# Patient Record
Sex: Male | Born: 1950 | Hispanic: No | Marital: Married | Smoking: Former smoker
Health system: Southern US, Community
[De-identification: ages and names within clinical notes are randomized; demographics above are authoritative.]

## PROBLEM LIST (undated history)

## (undated) DIAGNOSIS — I639 Cerebral infarction, unspecified: Secondary | ICD-10-CM

## (undated) DIAGNOSIS — E785 Hyperlipidemia, unspecified: Secondary | ICD-10-CM

## (undated) DIAGNOSIS — E119 Type 2 diabetes mellitus without complications: Secondary | ICD-10-CM

## (undated) DIAGNOSIS — I6529 Occlusion and stenosis of unspecified carotid artery: Secondary | ICD-10-CM

## (undated) DIAGNOSIS — I10 Essential (primary) hypertension: Secondary | ICD-10-CM

## (undated) HISTORY — DX: Occlusion and stenosis of unspecified carotid artery: I65.29

## (undated) HISTORY — DX: Type 2 diabetes mellitus without complications: E11.9

---

## 2001-12-28 HISTORY — PX: NECK SURGERY: SHX720

## 2002-01-03 ENCOUNTER — Emergency Department (HOSPITAL_COMMUNITY): Admission: EM | Admit: 2002-01-03 | Discharge: 2002-01-03 | Payer: Self-pay | Admitting: Emergency Medicine

## 2002-06-19 ENCOUNTER — Encounter: Payer: Self-pay | Admitting: *Deleted

## 2002-06-19 ENCOUNTER — Ambulatory Visit (HOSPITAL_COMMUNITY): Admission: RE | Admit: 2002-06-19 | Discharge: 2002-06-19 | Payer: Self-pay | Admitting: *Deleted

## 2002-06-20 ENCOUNTER — Encounter: Payer: Self-pay | Admitting: *Deleted

## 2002-06-20 ENCOUNTER — Ambulatory Visit (HOSPITAL_COMMUNITY): Admission: RE | Admit: 2002-06-20 | Discharge: 2002-06-20 | Payer: Self-pay | Admitting: *Deleted

## 2002-07-07 ENCOUNTER — Inpatient Hospital Stay (HOSPITAL_COMMUNITY): Admission: RE | Admit: 2002-07-07 | Discharge: 2002-07-09 | Payer: Self-pay | Admitting: Neurosurgery

## 2002-07-07 ENCOUNTER — Encounter: Payer: Self-pay | Admitting: Neurosurgery

## 2013-06-27 DIAGNOSIS — I639 Cerebral infarction, unspecified: Secondary | ICD-10-CM

## 2013-06-27 HISTORY — DX: Cerebral infarction, unspecified: I63.9

## 2013-07-11 ENCOUNTER — Ambulatory Visit (INDEPENDENT_AMBULATORY_CARE_PROVIDER_SITE_OTHER): Payer: Self-pay | Admitting: Family Medicine

## 2013-07-11 VITALS — BP 144/80 | HR 78 | Temp 97.8°F | Resp 18 | Ht 64.0 in | Wt 153.0 lb

## 2013-07-11 DIAGNOSIS — R200 Anesthesia of skin: Secondary | ICD-10-CM

## 2013-07-11 DIAGNOSIS — Z13220 Encounter for screening for lipoid disorders: Secondary | ICD-10-CM

## 2013-07-11 DIAGNOSIS — R209 Unspecified disturbances of skin sensation: Secondary | ICD-10-CM

## 2013-07-11 LAB — LIPID PANEL
Cholesterol: 257 mg/dL — ABNORMAL HIGH (ref 0–200)
HDL: 38 mg/dL — ABNORMAL LOW (ref >39)
Total CHOL/HDL Ratio: 6.8 Ratio
Triglycerides: 452 mg/dL — ABNORMAL HIGH (ref <150)

## 2013-07-11 LAB — POCT GLYCOSYLATED HEMOGLOBIN (HGB A1C): Hemoglobin A1C: 6.4

## 2013-07-11 NOTE — Patient Instructions (Signed)
1800 Calorie Diet for Diabetes Meal Planning The 1800 calorie diet is designed for eating up to 1800 calories each day. Following this diet and making healthy meal choices can help improve overall health. This diet controls blood sugar (glucose) levels and can also help lower blood pressure and cholesterol. SERVING SIZES Measuring foods and serving sizes helps to make sure you are getting the right amount of food. The list below tells how big or small some common serving sizes are:  1 oz.........4 stacked dice.  3 oz........Marland KitchenDeck of cards.  1 tsp.......Marland KitchenTip of little finger.  1 tbs......Marland KitchenMarland KitchenThumb.  2 tbs.......Marland KitchenGolf ball.   cup......Marland KitchenHalf of a fist.  1 cup.......Marland KitchenA fist. GUIDELINES FOR CHOOSING FOODS The goal of this diet is to eat a variety of foods and limit calories to 1800 each day. This can be done by choosing foods that are low in calories and fat. The diet also suggests eating small amounts of food frequently. Doing this helps control your blood glucose levels so they do not get too high or too low. Each meal or snack may include a protein food source to help you feel more satisfied and to stabilize your blood glucose. Try to eat about the same amount of food around the same time each day. This includes weekend days, travel days, and days off work. Space your meals about 4 to 5 hours apart and add a snack between them if you wish.  For example, a daily food plan could include breakfast, a morning snack, lunch, dinner, and an evening snack. Healthy meals and snacks include whole grains, vegetables, fruits, lean meats, poultry, fish, and dairy products. As you plan your meals, select a variety of foods. Choose from the bread and starch, vegetable, fruit, dairy, and meat/protein groups. Examples of foods from each group and their suggested serving sizes are listed below. Use measuring cups and spoons to become familiar with what a healthy portion looks like. Bread and Starch Each serving  equals 15 grams of carbohydrates.  1 slice bread.   bagel.   cup cold cereal (unsweetened).   cup hot cereal or mashed potatoes.  1 small potato (size of a computer mouse).   cup cooked pasta or rice.   English muffin.  1 cup broth-based soup.  3 cups of popcorn.  4 to 6 whole-wheat crackers.   cup cooked beans, peas, or corn. Vegetable Each serving equals 5 grams of carbohydrates.   cup cooked vegetables.  1 cup raw vegetables.   cup tomato or vegetable juice. Fruit Each serving equals 15 grams of carbohydrates.  1 small apple or orange.  1 cup watermelon or strawberries.   cup applesauce (no sugar added).  2 tbs raisins.   banana.   cup canned fruit, packed in water, its own juice, or sweetened with a sugar substitute.   cup unsweetened fruit juice. Dairy Each serving equals 12 to 15 grams of carbohydrates.  1 cup fat-free milk.  6 oz artificially sweetened yogurt or plain yogurt.  1 cup low-fat buttermilk.  1 cup soy milk.  1 cup almond milk. Meat/Protein  1 large egg.  2 to 3 oz meat, poultry, or fish.   cup low-fat cottage cheese.  1 tbs peanut butter.  1 oz low-fat cheese.   cup tuna in water.   cup tofu. Fat  1 tsp oil.  1 tsp trans-fat-free margarine.  1 tsp butter.  1 tsp mayonnaise.  2 tbs avocado.  1 tbs salad dressing.  1 tbs cream cheese.  2 tbs sour cream. SAMPLE 1800 CALORIE DIET PLAN Breakfast   cup unsweetened cereal (1 carb serving).  1 cup fat-free milk (1 carb serving).  1 slice whole-wheat toast (1 carb serving).   small banana (1 carb serving).  1 scrambled egg.  1 tsp trans-fat-free margarine. Lunch  Tuna sandwich.  2 slices whole-wheat bread (2 carb servings).   cup canned tuna in water, drained.  1 tbs reduced fat mayonnaise.  1 stalk celery, chopped.  2 slices tomato.  1 lettuce leaf.  1 cup carrot sticks.  24 to 30 seedless grapes (2 carb  servings).  6 oz light yogurt (1 carb serving). Afternoon Snack  3 graham cracker squares (1 carb serving).  Fat-free milk, 1 cup (1 carb serving).  1 tbs peanut butter. Dinner  3 oz salmon, broiled with 1 tsp oil.  1 cup mashed potatoes (2 carb servings) with 1 tsp trans-fat-free margarine.  1 cup fresh or frozen green beans.  1 cup steamed asparagus.  1 cup fat-free milk (1 carb serving). Evening Snack  3 cups air-popped popcorn (1 carb serving).  2 tbs parmesan cheese sprinkled on top. MEAL PLAN Use this worksheet to help you make a daily meal plan based on the 1800 calorie diet suggestions. If you are using this plan to help you control your blood glucose, you may interchange carbohydrate-containing foods (dairy, starches, and fruits). Select a variety of fresh foods of varying colors and flavors. The total amount of carbohydrate in your meals or snacks is more important than making sure you include all of the food groups every time you eat. Choose from the following foods to build your day's meals:  8 Starches.  4 Vegetables.  3 Fruits.  2 Dairy.  6 to 7 oz Meat/Protein.  Up to 4 Fats. Your dietician can use this worksheet to help you decide how many servings and which types of foods are right for you. BREAKFAST Food Group and Servings / Food Choice Starch ________________________________________________________ Dairy _________________________________________________________ Fruit _________________________________________________________ Meat/Protein __________________________________________________ Fat ___________________________________________________________ LUNCH Food Group and Servings / Food Choice Starch ________________________________________________________ Meat/Protein __________________________________________________ Vegetable _____________________________________________________ Fruit  _________________________________________________________ Dairy _________________________________________________________ Fat ___________________________________________________________ Aura Fey Food Group and Servings / Food Choice Starch ________________________________________________________ Meat/Protein __________________________________________________ Fruit __________________________________________________________ Dairy _________________________________________________________ Laural Golden Food Group and Servings / Food Choice Starch _________________________________________________________ Meat/Protein ___________________________________________________ Dairy __________________________________________________________ Vegetable ______________________________________________________ Fruit ___________________________________________________________ Fat ____________________________________________________________ Lollie Sails Food Group and Servings / Food Choice Fruit __________________________________________________________ Meat/Protein ___________________________________________________ Dairy __________________________________________________________ Starch _________________________________________________________ DAILY TOTALS Starch ____________________________ Vegetable _________________________ Fruit _____________________________ Dairy _____________________________ Meat/Protein______________________ Fat _______________________________ Document Released: 07/06/2005 Document Revised: 03/07/2012 Document Reviewed: 10/30/2011 [employer] Patient Information 2014 [employer]. Diabetes, Eating Away From Home Sometimes, you might eat in a restaurant or have meals that are prepared by someone else. You can enjoy eating out. However, the portions in restaurants may be much larger than needed. Listed below are some ideas to help you choose foods that will keep your blood glucose  (sugar) in better control.  TIPS FOR EATING OUT  Know your meal plan and how many carbohydrate servings you should have at each meal. You may wish to carry a copy of your meal plan in your purse or wallet. Learn the foods included in each food group.  Make a list of restaurants near you that offer healthy choices. Take a copy of the carry-out menus to see what they offer. Then, you can plan what you will order ahead of time.  Become familiar with serving sizes by practicing them  at home using measuring cups and spoons. Once you learn to recognize portion sizes, you will be able to correctly estimate the amount of total carbohydrate you are allowed to eat at the restaurant. Ask for a takeout box if the portion is more than you should have. When your food comes, leave the amount you should have on the plate, and put the rest in the takeout box before you start eating.  Plan ahead if your mealtime will be different from usual. Check with your caregiver to find out how to time meals and medicine if you are taking insulin.  Avoid high-fat foods, such as fried foods, cream sauces, high-fat salad dressings, or any added butter or margarine.  Do not be afraid to ask questions. Ask your server about the portion size, cooking methods, ingredients and if items can be substituted. Restaurants do not list all available items on the menu. You can ask for your main entree to be prepared using skim milk, oil instead of butter or margarine, and without gravy or sauces. Ask your waiter or waitress to serve salad dressings, gravy, sauces, margarine, and sour cream on the side. You can then add the amount your meal plan suggests.  Add more vegetables whenever possible.  Avoid items that are labeled "jumbo," "giant," "deluxe," or "supersized."  You may want to split an entre with someone and order an extra side salad.  Watch for hidden calories in foods like croutons, bacon, or cheese.  Ask your server to take  away the bread basket or chips from your table.  Order a dinner salad as an appetizer. You can eat most foods served in a restaurant. Some foods are better choices than others. Breads and Starches  Recommended: All kinds of bread (wheat, rye, white, oatmeal, [place], [place], raisin), hard or soft dinner rolls, frankfurter or hamburger buns, small bagels, small corn or whole-wheat flour tortillas.  Avoid: Frosted or glazed breads, butter rolls, egg or cheese breads, croissants, sweet rolls, pastries, coffee cake, glazed or frosted doughnuts, muffins. Crackers  Recommended: Animal crackers, graham, rye, saltine, oyster, and matzoth crackers. Bread sticks, melba toast, rusks, pretzels, popcorn (without fat), zwieback toast.  Avoid: High-fat snack crackers or chips. Buttered popcorn. Cereals  Recommended: Hot and cold cereals. Whole grains such as oatmeal or shredded wheat are good choices.  Avoid: Sugar-coated or granola type cereals. Potatoes/Pasta/Rice/Beans  Recommended: Order baked, boiled, or mashed potatoes, rice or noodles without added fat, whole beans. Order gravies, butter, margarine, or sauces on the side so you can control the amount you add.  Avoid: Hash browns or fried potatoes. Potatoes, pasta, or rice prepared with cream or cheese sauce. Potato or pasta salads prepared with large amounts of dressing. Fried beans or fried rice. Vegetables  Recommended: Order steamed, baked, boiled, or stewed vegetables without sauces or extra fat. Ask that sauce be served on the side. If vegetables are not listed on the menu, ask what is available.  Avoid: Vegetables prepared with cream, butter, or cheese sauce. Fried vegetables. Salad Bars  Recommended: Many of the vegetables at a salad bar are considered "free." Use lemon juice, vinegar, or low-calorie salad dressing (fewer than 20 calories per serving) as "free" dressings for your salad. Look for salad bar ingredients that have no added  fat or sugar such as tomatoes, lettuce, cucumbers, broccoli, carrots, onions, and mushrooms.  Avoid: Prepared salads with large amounts of dressing, such as coleslaw, caesar salad, macaroni salad, bean salad, or carrot salad. Fruit  Recommended:  Eat fresh fruit or fresh fruit salad without added dressing. A salad bar often offers fresh fruit choices, but canned fruit at a restaurant is usually packed in sugar or syrup.  Avoid: Sweetened canned or frozen fruits, plain or sweetened fruit juice. Fruit salads with dressing, sour cream, or sugar added to them. Meat and Meat Substitutes  Recommended: Order broiled, baked, roasted, or grilled meat, poultry, or fish. Trim off all visible fat. Do not eat the skin of poultry. The size stated on the menu is the raw weight. Meat shrinks by  in cooking (for example, 4 oz raw equals 3 oz cooked meat).  Avoid: Deep-fat fried meat, poultry, or fish. Breaded meats. Eggs  Recommended: Order soft, hard-cooked, poached, or scrambled eggs. Omelets may be okay, depending on what ingredients are added. Egg substitutes are also a good choice.  Avoid: Fried eggs, eggs prepared with cream or cheese sauce. Milk  Recommended: Order low-fat or fat-free milk according to your meal plan. Plain, nonfat yogurt or flavored yogurt with no sugar added may be used as a substitute for milk. Soy milk may also be used.  Avoid: Milk shakes or sweetened milk beverages. Soups and Combination Foods  Recommended: Clear broth or consomm are "free" foods and may be used as an appetizer. Broth-based soups with fat removed count as a starch serving and are preferred over cream soups. Soups made with beans or split peas may be eaten but count as a starch.  Avoid: Fatty soups, soup made with cream, cheese soup. Combination foods prepared with excessive amounts of fat or with cream or cheese sauces. Desserts and Sweets  Recommended: Ask for fresh fruit. Sponge or angel food cake  without icing, ice milk, no sugar added ice cream, sherbet, or frozen yogurt may fit into your meal plan occasionally.  Avoid: Pastries, puddings, pies, cakes with icing, custard, gelatin desserts. Fats and Oils  Recommended: Choose healthy fats such as olive oil, canola oil, or tub margarine, reduced fat or fat-free sour cream, cream cheese, avocado, or nuts.  Avoid: Any fats in excess of your allowed portion. Deep-fried foods or any food with a large amount of fat. Note: Ask for all fats to be served on the side, and limit your portion sizes according to your meal plan. Document Released: 12/14/2005 Document Revised: 03/07/2012 Document Reviewed: 07/04/2009 [facility] Patient Information 2014 [place], [place]. Diabetes Meal Planning Guide The diabetes meal planning guide is a tool to help you plan your meals and snacks. It is important for people with diabetes to manage their blood glucose (sugar) levels. Choosing the right foods and the right amounts throughout your day will help control your blood glucose. Eating right can even help you improve your blood pressure and reach or maintain a healthy weight. CARBOHYDRATE COUNTING MADE EASY When you eat carbohydrates, they turn to sugar. This raises your blood glucose level. Counting carbohydrates can help you control this level so you feel better. When you plan your meals by counting carbohydrates, you can have more flexibility in what you eat and balance your medicine with your food intake. Carbohydrate counting simply means adding up the total amount of carbohydrate grams in your meals and snacks. Try to eat about the same amount at each meal. Foods with carbohydrates are listed below. Each portion below is 1 carbohydrate serving or 15 grams of carbohydrates. Ask your dietician how many grams of carbohydrates you should eat at each meal or snack. Grains and Starches  1 slice bread.  English muffin or hotdog/hamburger bun.   cup cold cereal  (unsweetened).   cup cooked pasta or rice.   cup starchy vegetables (corn, potatoes, peas, beans, winter squash).  1 tortilla (6 inches).   bagel.  1 waffle or pancake (size of a CD).   cup cooked cereal.  4 to 6 small crackers. *Whole grain is recommended. Fruit  1 cup fresh unsweetened berries, melon, papaya, pineapple.  1 small fresh fruit.   banana or mango.   cup fruit juice (4 oz unsweetened).   cup canned fruit in natural juice or water.  2 tbs dried fruit.  12 to 15 grapes or cherries. Milk and Yogurt  1 cup fat-free or 1% milk.  1 cup soy milk.  6 oz light yogurt with sugar-free sweetener.  6 oz low-fat soy yogurt.  6 oz plain yogurt. Vegetables  1 cup raw or  cup cooked is counted as 0 carbohydrates or a "free" food.  If you eat 3 or more servings at 1 meal, count them as 1 carbohydrate serving. Other Carbohydrates   oz chips or pretzels.   cup ice cream or frozen yogurt.   cup sherbet or sorbet.  2 inch square cake, no frosting.  1 tbs honey, sugar, jam, jelly, or syrup.  2 small cookies.  3 squares of graham crackers.  3 cups popcorn.  6 crackers.  1 cup broth-based soup.  Count 1 cup casserole or other mixed foods as 2 carbohydrate servings.  Foods with less than 20 calories in a serving may be counted as 0 carbohydrates or a "free" food. You may want to purchase a book or computer software that lists the carbohydrate gram counts of different foods. In addition, the nutrition facts panel on the labels of the foods you eat are a good source of this information. The label will tell you how big the serving size is and the total number of carbohydrate grams you will be eating per serving. Divide this number by 15 to obtain the number of carbohydrate servings in a portion. Remember, 1 carbohydrate serving equals 15 grams of carbohydrate. SERVING SIZES Measuring foods and serving sizes helps you make sure you are getting the  right amount of food. The list below tells how big or small some common serving sizes are.  1 oz.........4 stacked dice.  3 oz........Marland KitchenDeck of cards.  1 tsp.......Marland KitchenTip of little finger.  1 tbs......Marland KitchenMarland KitchenThumb.  2 tbs.......Marland KitchenGolf ball.   cup......Marland KitchenHalf of a fist.  1 cup.......Marland KitchenA fist. SAMPLE DIABETES MEAL PLAN Below is a sample meal plan that includes foods from the grain and starches, dairy, vegetable, fruit, and meat groups. A dietician can individualize a meal plan to fit your calorie needs and tell you the number of servings needed from each food group. However, controlling the total amount of carbohydrates in your meal or snack is more important than making sure you include all of the food groups at every meal. You may interchange carbohydrate containing foods (dairy, starches, and fruits). The meal plan below is an example of a 2000 calorie diet using carbohydrate counting. This meal plan has 17 carbohydrate servings. Breakfast  1 cup oatmeal (2 carb servings).   cup light yogurt (1 carb serving).  1 cup blueberries (1 carb serving).   cup almonds. Snack  1 large apple (2 carb servings).  1 low-fat string cheese stick. Lunch  Chicken breast salad.  1 cup spinach.   cup chopped tomatoes.  2 oz chicken breast, sliced.  2 tbs  low-fat [place] dressing.  12 whole-wheat crackers (2 carb servings).  12 to 15 grapes (1 carb serving).  1 cup low-fat milk (1 carb serving). Snack  1 cup carrots.   cup hummus (1 carb serving). Dinner  3 oz broiled salmon.  1 cup brown rice (3 carb servings). Snack  1  cups steamed broccoli (1 carb serving) drizzled with 1 tsp olive oil and lemon juice.  1 cup light pudding (2 carb servings). DIABETES MEAL PLANNING WORKSHEET Your dietician can use this worksheet to help you decide how many servings of foods and what types of foods are right for you.  BREAKFAST Food Group and Servings / Carb Servings Grain/Starches  __________________________________ Dairy __________________________________________ Vegetable ______________________________________ Fruit ___________________________________________ Meat __________________________________________ Fat ____________________________________________ LUNCH Food Group and Servings / Carb Servings Grain/Starches ___________________________________ Dairy ___________________________________________ Fruit ____________________________________________ Meat ___________________________________________ Fat _____________________________________________ Laural Golden Food Group and Servings / Carb Servings Grain/Starches ___________________________________ Dairy ___________________________________________ Fruit ____________________________________________ Meat ___________________________________________ Fat _____________________________________________ SNACKS Food Group and Servings / Carb Servings Grain/Starches ___________________________________ Dairy ___________________________________________ Vegetable _______________________________________ Fruit ____________________________________________ Meat ___________________________________________ Fat _____________________________________________ DAILY TOTALS Starches _________________________ Vegetable ________________________ Fruit ____________________________ Dairy ____________________________ Meat ____________________________ Fat ______________________________ Document Released: 09/10/2005 Document Revised: 03/07/2012 Document Reviewed: 07/22/2009 [employer] Patient Information 2014 [employer]. Diabetes and Exercise Regular exercise is important and can help:   Control blood glucose (sugar).  Decrease blood pressure.    Control blood lipids (cholesterol, triglycerides).  Improve overall health. BENEFITS FROM EXERCISE  Improved fitness.  Improved flexibility.  Improved endurance.  Increased bone  density.  Weight control.  Increased muscle strength.  Decreased body fat.  Improvement of the body's use of insulin, a hormone.  Increased insulin sensitivity.  Reduction of insulin needs.  Reduced stress and tension.  Helps you feel better. People with diabetes who add exercise to their lifestyle gain additional benefits, including:  Weight loss.  Reduced appetite.  Improvement of the body's use of blood glucose.  Decreased risk factors for heart disease:  Lowering of cholesterol and triglycerides.  Raising the level of good cholesterol (high-density lipoproteins, HDL).  Lowering blood sugar.  Decreased blood pressure. TYPE 1 DIABETES AND EXERCISE  Exercise will usually lower your blood glucose.  If blood glucose is greater than 240 mg/dl, check urine ketones. If ketones are present, do not exercise.  Location of the insulin injection sites may need to be adjusted with exercise. Avoid injecting insulin into areas of the body that will be exercised. For example, avoid injecting insulin into:  The arms when playing tennis.  The legs when jogging. For more information, discuss this with your caregiver.  Keep a record of:  Food intake.  Type and amount of exercise.  Expected peak times of insulin action.  Blood glucose levels. Do this before, during, and after exercise. Review your records with your caregiver. This will help you to develop guidelines for adjusting food intake and insulin amounts.  TYPE 2 DIABETES AND EXERCISE  Regular physical activity can help control blood glucose.  Exercise is important because it may:  Increase the body's sensitivity to insulin.  Improve blood glucose control.  Exercise reduces the risk of heart disease. It decreases serum cholesterol and triglycerides. It also lowers blood pressure.  Those who take insulin or oral hypoglycemic agents should watch for signs of hypoglycemia. These signs include dizziness, shaking,  sweating, chills, and confusion.  Body water is lost during exercise. It must be replaced. This will help to avoid loss of body fluids (dehydration) or heat stroke. Be  sure to talk to your caregiver before starting an exercise program to make sure it is safe for you. Remember, any activity is better than none.  Document Released: 03/05/2004 Document Revised: 03/07/2012 Document Reviewed: 06/20/2009 [facility] Patient Information 2014 [place], [place].

## 2013-07-11 NOTE — Progress Notes (Signed)
[facility]:  Office Visit  Chief Complaint:  Chief Complaint  Patient presents with  . Blood Pressure Check    HPI: Juan [name] is a 62 y.o. male who complains of right sided arm numbness without weakness and he had a HA in the occipital area  Since yesterday morning. He woke up with it.  He deneis any vision changes, diaphoresis, n/v/abd pain, CP, SOB, palpitations. He still has numbness right now but a little better. He woke up with numbness on the right side of his arm and also his right leg. He had pain in his right arm but not in his right leg. He does not work and does not do any heavy lifting or activities. He does not know if he has diabetes. He does not know if he has high cholesterol. He also had right leg numbness. He slept on his side on his right shoulder. He denies having shoulder pain. Pushing. He had neck surgery in 2003 in [place] on [facility] at [facility]. He currently denies any neck pain.   He has not had any assymetric weakness.       History reviewed. No pertinent past medical history. Past Surgical History  Procedure Laterality Date  . Neck surgery     History   Social History  . Marital Status: Married    Spouse Name: N/A    Number of Children: N/A  . Years of Education: N/A   Social History Main Topics  . Smoking status: Never Smoker   . Smokeless tobacco: None  . Alcohol Use: No  . Drug Use: No  . Sexually Active: None   Other Topics Concern  . None   Social History Narrative  . None   History reviewed. No pertinent family history. No Known Allergies Prior to Admission medications   Not on File     ROS: The patient denies fevers, chills, night sweats, unintentional weight loss, chest pain, palpitations, wheezing, dyspnea on exertion, nausea, vomiting, abdominal pain, dysuria, hematuria, melena,  All other systems have been reviewed and were otherwise negative with the exception of those mentioned in the HPI and as  above.    PHYSICAL EXAM: Filed Vitals:   07/11/13 1549  BP: 144/80  Pulse: 78  Temp: 97.8 F (36.6 C)  Resp: 18   Filed Vitals:   07/11/13 1549  Height: 5\' 4"  (1.626 m)  Weight: 153 lb (69.4 kg)   Body mass index is 26.25 kg/(m^2).  General: Alert, no acute distress HEENT:  Normocephalic, atraumatic, oropharynx patent. EOMI, PERRLA, fundoscopic exam nl Cardiovascular:  Regular rate and rhythm, no rubs murmurs or gallops.  No Carotid bruits, radial pulse intact. No pedal edema.  Respiratory: Clear to auscultation bilaterally.  No wheezes, rales, or rhonchi.  No cyanosis, no use of accessory musculature GI: No organomegaly, abdomen is soft and non-tender, positive bowel sounds.  No masses. Skin: No rashes. Neurologic: Facial musculature symmetric. CN 2-12 grossly nl Psychiatric: Patient is appropriate throughout our interaction. Lymphatic: No cervical lymphadenopathy Musculoskeletal: Gait intact. UE and LE-normal, 5/5 strength, 2/2 DTRs, sensation intact Neck exam-nl, neg spurling Right shoulder-normal, neg Hawkins, Neers. Full ROM 5/5 strength    LABS: Results for orders placed in visit on 07/11/13  POCT GLYCOSYLATED HEMOGLOBIN (HGB A1C)      Result Value Range   Hemoglobin A1C 6.4       EKG/XRAY:   Primary read interpreted by Dr. Conley Rolls at [facility].   ASSESSMENT/PLAN: Encounter Diagnoses  Name Primary?  . Numbness and tingling of right arm Yes  . Screening for hyperlipidemia    Will do lifestyle modifications and see how he does Return in 3 months for recheck of HbA1c  Lipid panel pending We discussed different labs and also xray options,the patient is self pay and only wanted  Patient given instructions to monitor for stroke like sxs.  Go to ER prn    LE, THAO PHUONG, DO 07/11/2013 4:42 PM

## 2013-07-13 ENCOUNTER — Telehealth: Payer: Self-pay | Admitting: Family Medicine

## 2013-07-13 NOTE — Telephone Encounter (Signed)
Attempted to call but all numbers are invalid, not working or disconnected. Even for emergency contacts

## 2013-07-15 ENCOUNTER — Telehealth: Payer: Self-pay | Admitting: Family Medicine

## 2013-07-15 NOTE — Telephone Encounter (Signed)
Message copied by Lucia Gaskins on Sat Jul 15, 2013 10:56 AM ------      Message from: Lenell Antu      Created: Sat Jul 15, 2013 10:52 AM       Can you let him kno whe has high cholesterol, he needs to be on cholesterol medicine to decrease stroke and heart attack risk. If he does not want meds then he can change his diet, take fish oil 3 capsules daily with red yeast rice he can get otc and return to get his fasting lipids rechecked in 2 months. I prefer him to be on meds. Sideeffects are liver damage and msk aches so we need him to come back to get rechecked in 2 months if we put him on meds.  ------

## 2013-07-15 NOTE — Telephone Encounter (Signed)
Spoke with patients son and gave him the information.  He stated that if it was optional to take prescription, they would discuss it and get back with [place].  Recommended he take fish oil 3 times daily with red rice yeast.  Informed him to rtc in two months for fasting lipids.

## 2013-07-16 ENCOUNTER — Inpatient Hospital Stay (HOSPITAL_COMMUNITY)
Admission: EM | Admit: 2013-07-16 | Discharge: 2013-07-20 | DRG: 065 | Disposition: A | Discharge status: Home / Self Care | Payer: Medicaid Other | Attending: Internal Medicine | Admitting: Internal Medicine

## 2013-07-16 ENCOUNTER — Emergency Department (HOSPITAL_COMMUNITY): Payer: Medicaid Other

## 2013-07-16 ENCOUNTER — Telehealth: Payer: Self-pay | Admitting: Family Medicine

## 2013-07-16 ENCOUNTER — Encounter (HOSPITAL_COMMUNITY): Payer: Self-pay | Admitting: Emergency Medicine

## 2013-07-16 DIAGNOSIS — E1151 Type 2 diabetes mellitus with diabetic peripheral angiopathy without gangrene: Secondary | ICD-10-CM | POA: Diagnosis present

## 2013-07-16 DIAGNOSIS — R51 Headache: Secondary | ICD-10-CM

## 2013-07-16 DIAGNOSIS — F3289 Other specified depressive episodes: Secondary | ICD-10-CM | POA: Diagnosis present

## 2013-07-16 DIAGNOSIS — I658 Occlusion and stenosis of other precerebral arteries: Secondary | ICD-10-CM | POA: Diagnosis present

## 2013-07-16 DIAGNOSIS — I10 Essential (primary) hypertension: Secondary | ICD-10-CM

## 2013-07-16 DIAGNOSIS — E781 Pure hyperglyceridemia: Secondary | ICD-10-CM | POA: Diagnosis present

## 2013-07-16 DIAGNOSIS — R5381 Other malaise: Secondary | ICD-10-CM

## 2013-07-16 DIAGNOSIS — Z8673 Personal history of transient ischemic attack (TIA), and cerebral infarction without residual deficits: Secondary | ICD-10-CM | POA: Diagnosis present

## 2013-07-16 DIAGNOSIS — I639 Cerebral infarction, unspecified: Secondary | ICD-10-CM

## 2013-07-16 DIAGNOSIS — I6350 Cerebral infarction due to unspecified occlusion or stenosis of unspecified cerebral artery: Secondary | ICD-10-CM

## 2013-07-16 DIAGNOSIS — I63239 Cerebral infarction due to unspecified occlusion or stenosis of unspecified carotid arteries: Secondary | ICD-10-CM | POA: Diagnosis present

## 2013-07-16 DIAGNOSIS — I6529 Occlusion and stenosis of unspecified carotid artery: Secondary | ICD-10-CM | POA: Diagnosis present

## 2013-07-16 DIAGNOSIS — F329 Major depressive disorder, single episode, unspecified: Secondary | ICD-10-CM | POA: Diagnosis present

## 2013-07-16 DIAGNOSIS — G8190 Hemiplegia, unspecified affecting unspecified side: Secondary | ICD-10-CM | POA: Diagnosis present

## 2013-07-16 DIAGNOSIS — R7309 Other abnormal glucose: Secondary | ICD-10-CM | POA: Diagnosis present

## 2013-07-16 DIAGNOSIS — I6340 Cerebral infarction due to embolism of unspecified cerebral artery: Principal | ICD-10-CM | POA: Diagnosis present

## 2013-07-16 DIAGNOSIS — E041 Nontoxic single thyroid nodule: Secondary | ICD-10-CM | POA: Diagnosis present

## 2013-07-16 HISTORY — DX: Cerebral infarction, unspecified: I63.9

## 2013-07-16 LAB — COMPREHENSIVE METABOLIC PANEL
Albumin: 3.3 g/dL — ABNORMAL LOW (ref 3.5–5.2)
Alkaline Phosphatase: 76 U/L (ref 39–117)
BUN: 14 mg/dL (ref 6–23)
Chloride: 97 mEq/L (ref 96–112)
GFR calc Af Amer: 84 mL/min — ABNORMAL LOW (ref >90)
Glucose, Bld: 159 mg/dL — ABNORMAL HIGH (ref 70–99)
Potassium: 4.7 mEq/L (ref 3.5–5.1)
Total Bilirubin: 0.2 mg/dL — ABNORMAL LOW (ref 0.3–1.2)

## 2013-07-16 LAB — DIFFERENTIAL
Lymphs Abs: 2.2 10*3/uL (ref 0.7–4.0)
Monocytes Relative: 6 % (ref 3–12)
Neutro Abs: 6.4 10*3/uL (ref 1.7–7.7)
Neutrophils Relative %: 57 % (ref 43–77)

## 2013-07-16 LAB — PROTIME-INR: INR: 0.92 (ref 0.00–1.49)

## 2013-07-16 LAB — POCT I-STAT TROPONIN I: Troponin i, poc: 0 ng/mL (ref 0.00–0.08)

## 2013-07-16 LAB — CBC
Hemoglobin: 14.6 g/dL (ref 13.0–17.0)
RBC: 5.47 MIL/uL (ref 4.22–5.81)

## 2013-07-16 LAB — POCT I-STAT, CHEM 8
BUN: 13 mg/dL (ref 6–23)
HCT: 47 % (ref 39.0–52.0)
Hemoglobin: 16 g/dL (ref 13.0–17.0)
Sodium: 136 mEq/L (ref 135–145)
TCO2: 27 mmol/L (ref 0–100)

## 2013-07-16 LAB — APTT: aPTT: 30 seconds (ref 24–37)

## 2013-07-16 LAB — TROPONIN I: Troponin I: <0.3 ng/mL (ref <0.30)

## 2013-07-16 MED ORDER — SENNOSIDES-DOCUSATE SODIUM 8.6-50 MG PO TABS
1.0000 | ORAL_TABLET | Freq: Every evening | ORAL | Status: DC | PRN
Start: 2013-07-16 — End: 2013-07-20

## 2013-07-16 MED ORDER — ASPIRIN EC 81 MG PO TBEC
81.0000 mg | DELAYED_RELEASE_TABLET | Freq: Every day | ORAL | Status: DC
  Administered 2013-07-16 – 2013-07-20 (×5): 81 mg via ORAL
  Filled 2013-07-16 (×5): qty 1

## 2013-07-16 MED ORDER — ENOXAPARIN SODIUM 40 MG/0.4ML ~~LOC~~ SOLN
40.0000 mg | SUBCUTANEOUS | Status: DC
  Administered 2013-07-16 – 2013-07-19 (×4): 40 mg via SUBCUTANEOUS
  Filled 2013-07-16 (×5): qty 0.4

## 2013-07-16 MED ORDER — GADOBENATE DIMEGLUMINE 529 MG/ML IV SOLN
15.0000 mL | Freq: Once | INTRAVENOUS | Status: AC | PRN
  Administered 2013-07-16: 15 mL via INTRAVENOUS

## 2013-07-16 MED ORDER — SODIUM CHLORIDE 0.9 % IV SOLN
INTRAVENOUS | Status: DC
  Administered 2013-07-16: 22:00:00 via INTRAVENOUS

## 2013-07-16 MED ORDER — ATORVASTATIN CALCIUM 80 MG PO TABS
80.0000 mg | ORAL_TABLET | Freq: Every day | ORAL | Status: DC
  Administered 2013-07-16 – 2013-07-19 (×4): 80 mg via ORAL
  Filled 2013-07-16 (×6): qty 1

## 2013-07-16 NOTE — ED Provider Notes (Signed)
History    CSN: [id number] Arrival date & time 07/16/13  0930  First MD Initiated Contact with Patient 07/16/13 [phone]     Chief Complaint  Patient presents with  . Extremity Weakness   (Consider location/radiation/quality/duration/timing/severity/associated sxs/prior Treatment) HPI Comments: 62 year old male who has no significant history of medical problems but who does not go to the physician very often who presents with approximately 6 days of right upper extremity weakness. The family states that approximately one week ago the patient developed an acute onset of a severe headache followed by right upper extremity weakness. Since that time his headache has improved but his right upper extremity has remained essentially immobile. He is unable to use his hand for any function, he cannot grip, he now states that his right leg is feeling slightly weak and has some difficulty walking. The family states that he has no difficulty with speech, his spouse states that he has some change in the appearance of his face as it is somewhat droopy according to the family member. The symptoms are persistent, gradually worsening, nothing seems to make it better or worse.  The history is provided by the patient, the spouse and a relative.   History reviewed. No pertinent past medical history. Past Surgical History  Procedure Laterality Date  . Neck surgery     No family history on file. History  Substance Use Topics  . Smoking status: Never Smoker   . Smokeless tobacco: Not on file  . Alcohol Use: No    Review of Systems  All other systems reviewed and are negative.    Allergies  Review of patient's allergies indicates no known allergies.  Home Medications  No current outpatient prescriptions on file. BP 158/81  Pulse 62  Temp(Src) 97.8 F (36.6 C) (Oral)  Resp 16  SpO2 97% Physical Exam  Nursing note and vitals reviewed. Constitutional: He appears well-developed and well-nourished. No  distress.  HENT:  Head: Normocephalic and atraumatic.  Mouth/Throat: Oropharynx is clear and moist. No oropharyngeal exudate.  Eyes: Conjunctivae and EOM are normal. Pupils are equal, round, and reactive to light. Right eye exhibits no discharge. Left eye exhibits no discharge. No scleral icterus.  Neck: Normal range of motion. Neck supple. No JVD present. No thyromegaly present.  Cardiovascular: Normal rate, regular rhythm, normal heart sounds and intact distal pulses.  Exam reveals no gallop and no friction rub.   No murmur heard. Pulmonary/Chest: Effort normal and breath sounds normal. No respiratory distress. He has no wheezes. He has no rales.  Abdominal: Soft. Bowel sounds are normal. He exhibits no distension and no mass. There is no tenderness.  Musculoskeletal: Normal range of motion. He exhibits no edema and no tenderness.  Lymphadenopathy:    He has no cervical adenopathy.  Neurological: He is alert. Coordination normal.  Right upper extremity with severe weakness at the shoulder elbow and wrist. There is no ability to grip on the right upper extremity. The left upper extremity is normal, bilateral lower extremities are completely normal, there is no cranial nerve deficits are apparent on exam including no facial droop.  Skin: Skin is warm and dry. No rash noted. No erythema.  Psychiatric: He has a normal mood and affect. His behavior is normal.    ED Course  Procedures (including critical care time) Labs Reviewed  CBC - Abnormal; Notable for the following:    WBC 11.3 (*)    All other components within normal limits  DIFFERENTIAL - Abnormal; Notable for  the following:    Eosinophils Relative 17 (*)    Eosinophils Absolute 2.0 (*)    All other components within normal limits  POCT I-STAT, CHEM 8 - Abnormal; Notable for the following:    Glucose, Bld 165 (*)    All other components within normal limits  PROTIME-INR  APTT  ETHANOL  COMPREHENSIVE METABOLIC PANEL  TROPONIN  I  URINE RAPID DRUG SCREEN (HOSP PERFORMED)  URINALYSIS, ROUTINE W REFLEX MICROSCOPIC  POCT I-STAT TROPONIN I   Ct Head Wo Contrast  07/16/2013   *RADIOLOGY REPORT*  Clinical Data: Right arm/leg weakness  CT HEAD WITHOUT CONTRAST  Technique:  Contiguous axial images were obtained from the base of the skull through the vertex without contrast.  Comparison: None.  Findings: No evidence of parenchymal hemorrhage or extra-axial fluid collection. No mass lesion, mass effect, or midline shift.  Multiple subcortical and cortical hypodensities, including in the left frontal lobe (series 2/images 20-22), subcortical right frontal lobe (series 2/image 17), right basal ganglia (series 2/image 13), and left occipital lobe (series 2/image 18).  While most of this is likely chronic, left MCA distribution cortical infarcts in the posterior left frontal lobe/motor strip (series 2/image 22) and left occipital lobe (series 2/image 18) may be subacute.  Subcortical white matter and periventricular small vessel ischemic changes.  Intracranial atherosclerosis.  Mild global cortical atrophy for age.  No ventriculomegaly.  The visualized paranasal sinuses are essentially clear. The mastoid air cells are unopacified.  Left mastoid is under pneumatized.  No evidence of calvarial fracture.  IMPRESSION: Possible subacute left MCA distribution infarcts, as described above.  Additional multifocal chronic infarcts, small vessel ischemic changes, and intracranial atherosclerosis.  These results were called by telephone on 07/16/2013 at 1100 hours to Eber Hong, who verbally acknowledged these results.   Original Report Authenticated By: Charline Bills, M.D.   1. Stroke     MDM  At this time the patient appears to have what is apparent stroke affecting the right side of his body, he is not significantly hypertensive at 158/81, afebrile, pulse of 62 and normal oxygen saturations. He does not appear to be in distress, he is  hemodynamically stable, he will need a head CT and further workup.  I have discussed the patient's care with the neurologist Dr. Leroy Kennedy, he agrees that the patient has likely had a stroke, he will need admission to the hospital.  11:00 AM, the CT scan has been read by the radiologist who agrees that there are several areas of subacute stroke and several areas of chronic stroke.   ED ECG REPORT  I personally interpreted this EKG   Date: 07/16/2013   Rate: 58  Rhythm: sinus bradycardia  QRS Axis: normal  Intervals: normal  ST/T Wave abnormalities: normal  Conduction Disutrbances:none  Narrative Interpretation:   Old EKG Reviewed: none available  There is no old EKG with which to compare, there is an intraventricular conduction delay but is nonspecific. I discussed the patient's care with the internal medicine resident Dr. Manson Passey, he will be admitted to the hospital.   Vida Roller, MD 07/16/13 1113

## 2013-07-16 NOTE — Telephone Encounter (Signed)
Spoke with daughter, patient had worsening sxs and was taken to the ER today.

## 2013-07-16 NOTE — Progress Notes (Addendum)
11:28 PM Patient is a 62 yo male admitted for CVA on 07/17/11 with RUE and RLE weakness and possible aphasia.   MRI HEAD WITHOUT AND WITH CONTRAST IMPRESSION: 1. Confluent posterior left MCA and left MCA / PCA watershed acute  infarcts corresponding to the cortically based abnormality on the  recent head CT.  2. Addition of multiple small punctate acute infarcts in both  anterior superior MCA territory is, as well as a 1-2 cm acute  lacunar infarct in the right caudothalamic notch (also visible on  CT), plus a lacunar infarct in the genu of the corpus callosum  (ACA territory). These therefore suggest a recent anterior  circulation embolic shower.  3. No mass effect or hemorrhage associated with the above.  4. Underlying chronic small vessel ischemia.   I was called by the RN who was attempting to give Mr. Juan [name] his evening dose of atorvastatin and found the patient was having difficulty swallowing the pill.  The RN paged IM to let us know that he was going to make him NPO due to this finding.  He passed an initial bedside swallowing test in the ED.  We ordered a SLP swallow evaluation.    Filed Vitals:   07/16/13 2200  BP: 152/86  Pulse: 72  Temp: 97.8 F (36.6 C)  Resp: 18   On physical exam, patient did not exhibit any additional neurological deficits than previously documented.    A/P:  1. 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   -NPO -NS 158ml/hr -Ordered SLP swallow evaluation  2. Subacute CVA --Neuro consulted -carotid dopplers, echo  -on ASA and atorvastatin  -PT/OT consults

## 2013-07-16 NOTE — Progress Notes (Signed)
Gave pt scheduled p.o. Med @ 1950, pt having dfficulty swallowing pill. Pt made NPO and ST eval ordered. Per family pt have been having some difficulty swallowing since Monday.

## 2013-07-16 NOTE — H&P (Signed)
Date: 07/16/2013               Patient Name:  Juan [name] MRN: [id number]  DOB: 07/03/1951 Age / Sex: 62 y.o., male   PCP: No primary provider on file.              Medical Service: Internal Medicine Teaching Service              Attending Physician: Dr. Rocco Serene    First Contact: Heywood Iles, MS4 Pager: [phone]  Second Contact: Dr. Janalyn Harder Pager: [phone]            After Hours (After 5p/  First Contact Pager: [phone]  weekends / holidays): Second Contact Pager: [phone]   Chief Complaint: right sided arm and leg weakness  History of Present Illness: Patient is a 62 year old male without a past medical history who presents with right-sided arm and leg weakness. History was obtained from patient's family (daughters and wife) with confirmation by patient at times.   Last Monday, the patient woke up in the morning with headache that resulted in him falling and hitting his head on the floor. All day, he had right-sided arm weakness the entire day. The weakness persisted all throughout last week. Today, he notes new right-sided leg weakness which prompted a trip to the ED.   Per the chart, he went to Urgent Care last Tuesday (7/15) and was seen by Dr. Hamilton Capri at which time he complained of his headache and some numbness in his right leg. Labs at that time indicated A1c of 6.4 and total cholesterol of 257. He was advised to continue monitoring for stroke-like symptoms and "go to the ER prn."  He does not have a PCP.  In the ED, CT head w/o contrast was done which showed evidence of a possible subacute left MCA distribution infarct.  Meds: Current Facility-Administered Medications  Medication Dose Route Frequency Provider Last Rate Last Dose  . aspirin EC tablet 81 mg  81 mg Oral Daily Wyatt Portela, MD   81 mg at 07/16/13 1145   No current outpatient prescriptions on file.    Allergies: Allergies as of 07/16/2013  . (No Known Allergies)   History reviewed. No pertinent  past medical history. Past Surgical History  Procedure Laterality Date  . Neck surgery     No family history on file. History   Social History  . Marital Status: Married    Spouse Name: N/A    Number of Children: N/A  . Years of Education: N/A   Occupational History  . Not on file.   Social History Main Topics  . Smoking status: Never Smoker   . Smokeless tobacco: Not on file  . Alcohol Use: Yes     Comment: a few drinks occasionally   . Drug Use: Not on file  . Sexually Active: Not on file   Other Topics Concern  . Not on file   Social History Narrative   Lives at home with wife, son, and grandchildren.    Review of Systems: As noted in the HPI  Physical Exam: Blood pressure 153/83, pulse 66, temperature 97.5 F (36.4 C), temperature source Oral, resp. rate 18, SpO2 96.00%.  Physical Exam General: alert, cooperative, sitting upright in bed HEENT: pupils equal round and reactive to light, vision grossly intact, oropharynx clear and non-erythematous  Neck: supple, no lymphadenopathy, JVD, or carotid bruits Lungs: clear to ascultation bilaterally, normal work of respiration, no wheezes, rales, ronchi  Heart: regular rate and rhythm, no murmurs, gallops, or rubs Abdomen: soft, non-tender, non-distended, normal bowel sounds Extremities: 2+ DP/PT pulses bilaterally, no cyanosis, clubbing, or edema Neurologic: alert and grossly oriented x3 (could not assess due to patient's minimal responses either from language barrier or current state of health), CN II-XII intact, 5/5 LUE/LLE strength, could not move RUE, 1+ grip strength on right hand vs. 2+ left hand, could not test reflexes bilaterally likely with bed position, Babinski downgoing bilaterally    Lab results: BMET    Component Value Date/Time   NA 136 07/16/2013 1035   K 4.7 07/16/2013 1035   CL 101 07/16/2013 1035   CO2 26 07/16/2013 1019   GLUCOSE 165* 07/16/2013 1035   BUN 13 07/16/2013 1035   CREATININE 1.20  07/16/2013 1035   CALCIUM 9.6 07/16/2013 1019   GFRNONAA 73* 07/16/2013 1019   GFRAA 84* 07/16/2013 1019   Hepatic Function Panel     Component Value Date/Time   PROT 7.7 07/16/2013 1019   ALBUMIN 3.3* 07/16/2013 1019   AST 23 07/16/2013 1019   ALT 31 07/16/2013 1019   ALKPHOS 76 07/16/2013 1019   BILITOT 0.2* 07/16/2013 1019   CBC    Component Value Date/Time   WBC 11.3* 07/16/2013 1019   RBC 5.47 07/16/2013 1019   HGB 16.0 07/16/2013 1035   HCT 47.0 07/16/2013 1035   PLT 203 07/16/2013 1019   MCV 78.2 07/16/2013 1019   MCH 26.7 07/16/2013 1019   MCHC 34.1 07/16/2013 1019   RDW 13.7 07/16/2013 1019   LYMPHSABS 2.2 07/16/2013 1019   MONOABS 0.7 07/16/2013 1019   EOSABS 2.0* 07/16/2013 1019   BASOSABS 0.1 07/16/2013 1019   A1c: 6.5 (07/11/13)  Lipid Panel     Component Value Date/Time   CHOL 257* 07/11/2013 1629   TRIG 452* 07/11/2013 1629   HDL 38* 07/11/2013 1629   CHOLHDL 6.8 07/11/2013 1629   VLDL NOT CALC 07/11/2013 1629   LDLCALC Comment:   Not calculated due to Triglyceride >400. Suggest ordering Direct LDL (Unit Code: 78295).   Total Cholesterol/HDL Ratio:CHD Risk                        Coronary Heart Disease Risk Table                                        Men       Women          1/2 Average Risk              3.4        3.3              Average Risk              5.0        4.4           2X Average Risk              9.6        7.1           3X Average Risk             23.4       11.0 Use the calculated Patient Ratio above and the CHD Risk table  to determine the patient's CHD Risk. ATP III Classification (LDL):       <  100        mg/dL         Optimal      161 - 129     mg/dL         Near or Above Optimal      130 - 159     mg/dL         Borderline High      160 - 189     mg/dL         High       > 096        mg/dL         Very High   0/45/4098 1629   Troponin (Point of Care Test)  Recent Labs  07/16/13 1034  TROPIPOC 0.00   Imaging results:  Ct Head Wo Contrast  07/16/2013    *RADIOLOGY REPORT*  Clinical Data: Right arm/leg weakness  CT HEAD WITHOUT CONTRAST  Technique:  Contiguous axial images were obtained from the base of the skull through the vertex without contrast.  Comparison: None.  Findings: No evidence of parenchymal hemorrhage or extra-axial fluid collection. No mass lesion, mass effect, or midline shift.  Multiple subcortical and cortical hypodensities, including in the left frontal lobe (series 2/images 20-22), subcortical right frontal lobe (series 2/image 17), right basal ganglia (series 2/image 13), and left occipital lobe (series 2/image 18).  While most of this is likely chronic, left MCA distribution cortical infarcts in the posterior left frontal lobe/motor strip (series 2/image 22) and left occipital lobe (series 2/image 18) may be subacute.  Subcortical white matter and periventricular small vessel ischemic changes.  Intracranial atherosclerosis.  Mild global cortical atrophy for age.  No ventriculomegaly.  The visualized paranasal sinuses are essentially clear. The mastoid air cells are unopacified.  Left mastoid is under pneumatized.  No evidence of calvarial fracture.    IMPRESSION: Possible subacute left MCA distribution infarcts, as described above.  Additional multifocal chronic infarcts, small vessel ischemic changes, and intracranial atherosclerosis.  These results were called by telephone on 07/16/2013 at 1100 hours to Eber Hong, who verbally acknowledged these results.   Original Report Authenticated By: Charline Bills, M.D.   Assessment & Plan by Problem: 62 year old male without a past medical history who presents with right-sided arm and leg weakness.  #Right-sided arm/leg weakness: Likely secondary to ischemic storke given the worsening of symptoms and risk factors (uncontrolled blood pressure 140+/90+ per [facility] guidelines, dyslipidemia, pre-diabetes) and CT findings which correlate with focal findings.  -Admit to telemetry with neuro  checks -Order stroke workup: cardiac echo, carotid doppler, MRI/MRA brain (per neuro recs) -Start on heart healthy diet per results of swallow study -Start ASA 81mg  & atorvastatin -Check BMP & CBC tomorrow AM   -Assess with OT/PT eval  #DVT prophylaxis: Lovenox & SCDs.  #Disposition: Pending improvement in symptoms but possibly 3-5 days.   This is a Psychologist, occupational Note.  The care of the patient was discussed with Dr. Janalyn Harder and the assessment and plan was formulated with their assistance.  Please see their note for official documentation of the patient encounter.   Signed: Beather Arbour, Med Student 07/16/2013, 12:21 PM

## 2013-07-16 NOTE — ED Notes (Signed)
Pt undressed, in gown, on monitor, continuous pulse oxjmetry and blood pressure cuff; family at bedside

## 2013-07-16 NOTE — Consult Note (Addendum)
NEURO HOSPITALIST CONSULT NOTE    Reason for Consult:right upper extremity weakness and HA  HPI:                                                                                                                                          Juan [name] is an 62 y.o. male with a past medical history significant for cervical spine surgery who was in his usual state of health until 6 days ago when he awoke with a severe, pounding HA and collapsed to the floor without impairment of consciousness. Then he developed weakness of the right upper extremity and he has been unable to use the right arm ever since. Never had similar symptoms before Denies nausea, vomiting, double vision, slurred speech, language or vision impairment but still experiencing HA. No recent fever, infection, or head-neck trauma. CT brain revealed possible subacute left MCA distribution infarct.   History reviewed. No pertinent past medical history.  Past Surgical History  Procedure Laterality Date  . Neck surgery      No family history on file.  Family History:  Social History:  reports that he has never smoked. He does not have any smokeless tobacco history on file. He reports that he does not drink alcohol or use illicit drugs.  No Known Allergies  MEDICATIONS:                                                                                                                     I have reviewed the patient's current medications.   ROS:  History obtained from the patient, family, and chart review.  General ROS: negative for - chills, fatigue, fever, night sweats, weight gain or weight loss Psychological ROS: negative for - behavioral disorder, hallucinations, memory difficulties, mood swings or suicidal ideation Ophthalmic ROS: negative for - blurry vision, double vision, eye  pain or loss of vision ENT ROS: negative for - epistaxis, nasal discharge, oral lesions, sore throat, tinnitus or vertigo Allergy and Immunology ROS: negative for - hives or itchy/watery eyes Hematological and Lymphatic ROS: negative for - bleeding problems, bruising or swollen lymph nodes Endocrine ROS: negative for - galactorrhea, hair pattern changes, polydipsia/polyuria or temperature intolerance Respiratory ROS: negative for - cough, hemoptysis, shortness of breath or wheezing Cardiovascular ROS: negative for - chest pain, dyspnea on exertion, edema or irregular heartbeat Gastrointestinal ROS: negative for - abdominal pain, diarrhea, hematemesis, nausea/vomiting or stool incontinence Genito-Urinary ROS: negative for - dysuria, hematuria, incontinence or urinary frequency/urgency Musculoskeletal ROS: negative for - joint swelling Neurological ROS: as noted in HPI Dermatological ROS: negative for rash and skin lesion changes     Physical exam: pleasant male in no apparent distress. Blood pressure 158/81, pulse 62, temperature 97.8 F (36.6 C), temperature source Oral, resp. rate 16, SpO2 97.00%. Head: normocephalic. Neck: supple, no bruits, no JVD. Cardiac: no murmurs. Lungs: clear. Abdomen: soft, no tender, no mass. Extremities: no edema.    Neurologic Examination:                                                                                                      Mental Status: Alert, awake, oriented, thought content appropriate.  Speech fluent without evidence of aphasia.  Able to follow 3 step commands without difficulty. Cranial Nerves: II: Discs flat bilaterally; Visual fields grossly normal, pupils equal, round, reactive to light and accommodation III,IV, VI: ptosis not present, extra-ocular motions intact bilaterally V,VII: smile symmetric, facial light touch sensation normal bilaterally VIII: hearing normal bilaterally IX,X: gag reflex present XI: bilateral shoulder  shrug XII: midline tongue extension Motor: 3/5 monoparesis of the right upper extremity but 5/5 RLE and left side.      Tone: diminished in the RUE; no atrophy noted Sensory: Pinprick and light touch intact throughout, bilaterally Deep Tendon Reflexes:  1+upper extremities, 2+ LE bilaterally.  Plantars: Right: downgoing   Left: downgoing Cerebellar: normal finger-to-nose,  normal heel-to-shin test Gait: No ataxia. CV: pulses palpable throughout    Lab Results  Component Value Date/Time   CHOL 257* 07/11/2013  4:29 PM    No results found for this or any previous visit (from the past 48 hour(s)).  No results found.   Assessment/Plan: 62 Y/O male with new onset HA and right UE monoparesis. Concern for a left MCA distribution infarct.  Will get MRI with and without contrast and MRA brain. Aspirin 81 mg daily. PT consult. Will follow up.   Wyatt Portela, MD [facility] [phone]  07/16/2013, 10:34 AM

## 2013-07-16 NOTE — H&P (Signed)
Date: 07/16/2013               Patient Name:  Juan [name] MRN: [id number]  DOB: 08-01-1951 Age / Sex: 62 y.o., male   PCP: No primary provider on file.              Medical Service: Internal Medicine Teaching Service     I have reviewed the excellent note by Heywood Iles MS4 and was present during the interview and physical exam.  Please see below for findings, assessment, and plan.  Chief Complaint: Weakness  History of Present Illness:  The patient is a 62 yo man, no known PMH, presenting with weakness. The patient notes that 6 days ago, at 6am, he had abrupt onset of right arm weakness, headache, and word-finding difficulty. Symptoms persisted unchanged until the morning of admission, when he experienced abrupt onset of right leg weakness as well. The patient's family notes that the right side of his face also looks "puffy", though no obvious facial droop. The patient notes no numbness/tingling, visual changes, dizziness, or imbalance. He does not seek health care regularly, but notes no prior HTN, HL, or other medical problems. The patient notes neck surgery in 2003 for cord compression.  Meds: No outpatient medications  Allergies: Allergies as of 07/16/2013  . (No Known Allergies)    Past Medical History: Medical Student note reviewed  Family History: Medical Student note reviewed  Social History: Medical Student note reviewed  Surgical History: Medical Student note reviewed  Review of System: General: no fevers, chills, changes in weight, changes in appetite  Skin: no rash  HEENT: no blurry vision, hearing changes, sore throat  Pulm: no dyspnea, coughing, wheezing  CV: no chest pain, palpitations, shortness of breath  Abd: no abdominal pain, nausea/vomiting, diarrhea/constipation  GU: no dysuria, hematuria, polyuria  Ext: no arthralgias, myalgias  Neuro: see HPI  Physical Exam: Blood pressure 153/83, pulse 66, temperature 97.5 F (36.4 C), temperature source Oral,  resp. rate 18, SpO2 96.00%. General: alert, cooperative, and in no apparent distress HEENT: PERRL, EOMI, visual acuity limited by language barrier but no obvious defects, oropharynx clear and non-erythematous  Neck: supple, no lymphadenopathy, JVD, or carotid bruits Lungs: clear to ascultation bilaterally, normal work of respiration Heart: regular rate and rhythm, no murmurs, gallops, or rubs Abdomen: soft, non-tender, non-distended, normal bowel sounds  Extremities: no cyanosis, clubbing, or edema Neurologic: exam limited by participation and language barrier  A&O x3  CN II-XII intact, possible minimal right face droop but difficult to discern  Strength: 2/5 at right shoulder, right elbow, and right hand. 4/5 at right hip. Otherwise 5/5 throughout  Reflexes: 1+ throughout  Labs: Reviewed as noted in the Electronic Record  Imaging: Reviewed as noted in the Electronic Record  Assessment & Plan by Problem: The patient is a 62 yo M, no known PMH, presenting with CVA.   # Subacute CVA - symptoms started 6 days PTA, but worsened on the day of admission. Symptoms include right upper and lower extremity weakness, and possible word-finding difficulty. CT shows probable subacute L MCA distribution infarcts, with some chronic changes that may point to prior CVA. Unknown risk factors.  -Neuro consulted, appreciate recs  -MRI, carotid dopplers, echo  -FLP and A1C (6.4%) checked within the last week  -start aspirin, atorvastatin  -PT/OT consults  -patient passed ED swallow screen, will start [facility] diet   # HTN - BP mildly elevated on admission. No known history of HTN, but infrequent healthcare  system usage.  -hold any anti-HTN for now for permissive HTN in the setting of CVA   # Prediabetes - Newly diagnosed with an A1C = 6.4 -stress importance of diet and exercise  Dispo: Disposition is deferred at this time, awaiting improvement of current medical problems. Anticipated discharge in  approximately 2-3 day(s).   The patient does not have a current PCP (No primary provider on file.) and does need an [facility] hospital follow-up appointment after discharge.  Signed: Linward Headland, MD 07/16/2013, 1:30 PM

## 2013-07-16 NOTE — ED Notes (Signed)
Pt c/o right arm weakness. Family of pt stated that he had a h/a on Monday and then he fell to the ground and no is unable to move right arm. Pt was seen at a [facility] center Tuesday.

## 2013-07-16 NOTE — ED Notes (Signed)
Pt would like to try NIH scale later when he is less tired.

## 2013-07-17 ENCOUNTER — Inpatient Hospital Stay (HOSPITAL_COMMUNITY): Payer: Medicaid Other

## 2013-07-17 DIAGNOSIS — I517 Cardiomegaly: Secondary | ICD-10-CM

## 2013-07-17 DIAGNOSIS — I10 Essential (primary) hypertension: Secondary | ICD-10-CM

## 2013-07-17 LAB — BASIC METABOLIC PANEL
Calcium: 9 mg/dL (ref 8.4–10.5)
Creatinine, Ser: 1.03 mg/dL (ref 0.50–1.35)
GFR calc Af Amer: 87 mL/min — ABNORMAL LOW (ref >90)
GFR calc non Af Amer: 75 mL/min — ABNORMAL LOW (ref >90)

## 2013-07-17 LAB — LIPID PANEL: Total CHOL/HDL Ratio: 9.1 RATIO

## 2013-07-17 LAB — CBC WITH DIFFERENTIAL/PLATELET
Basophils Absolute: 0.1 10*3/uL (ref 0.0–0.1)
Basophils Relative: 1 % (ref 0–1)
Eosinophils Absolute: 1.5 10*3/uL — ABNORMAL HIGH (ref 0.0–0.7)
Eosinophils Relative: 15 % — ABNORMAL HIGH (ref 0–5)
HCT: 41.8 % (ref 39.0–52.0)
MCHC: 32.8 g/dL (ref 30.0–36.0)
MCV: 78.7 fL (ref 78.0–100.0)
Monocytes Absolute: 0.6 10*3/uL (ref 0.1–1.0)
RDW: 14 % (ref 11.5–15.5)

## 2013-07-17 LAB — URINE MICROSCOPIC-ADD ON

## 2013-07-17 LAB — RAPID URINE DRUG SCREEN, HOSP PERFORMED
Amphetamines: NOT DETECTED
Barbiturates: NOT DETECTED

## 2013-07-17 LAB — URINALYSIS, ROUTINE W REFLEX MICROSCOPIC
Glucose, UA: NEGATIVE mg/dL
Leukocytes, UA: NEGATIVE
Nitrite: NEGATIVE
Protein, ur: 100 mg/dL — AB

## 2013-07-17 LAB — HIV ANTIBODY (ROUTINE TESTING W REFLEX): HIV: NONREACTIVE

## 2013-07-17 MED ORDER — IOHEXOL 300 MG/ML  SOLN
50.0000 mL | Freq: Once | INTRAMUSCULAR | Status: AC | PRN
  Administered 2013-07-17: 50 mL via INTRAVENOUS

## 2013-07-17 NOTE — H&P (Signed)
I saw and evaluated the patient. I reviewed the resident's note and confirmed the resident's findings.  I agree with the assessment and plan as documented in the resident's note.  Briefly, Juan [name] is a 62 yo man who presented with RUE and RLE weakness and was found to have bilateral infarcts suggestive of an embolic shower, the source of which is unclear but likely in very first section of ascending aorta or within the heart.  We are continuing the work-up with TEE to assess for a cardiac source and CT angiography of the carotids to assess for evidence of high grade stenosis given conflicting MRA and doppler information.  He was not on any antiplatelet therapy prior to this event, hence why he is now on ASA.  He will also be aggressively treated with a statin while we complete the evaluation.

## 2013-07-17 NOTE — Progress Notes (Signed)
Stroke Team Progress Note  HISTORY Juan [name] is an 62 y.o. male with a past medical history significant for cervical spine surgery who was in his usual state of health until 6 days ago when he awoke with a severe, pounding HA and collapsed to the floor without impairment of consciousness. Then he developed weakness of the right upper extremity and he has been unable to use the right arm ever since. Never had similar symptoms before Denies nausea, vomiting, double vision, slurred speech, language or vision impairment but still experiencing HA.  No recent fever, infection, or head-neck trauma. CT brain revealed possible subacute left MCA distribution infarct. History reviewed. No pertinent past medical history.Patient was not a TPA candidate secondary to delay in arrival. He was admitted for further evaluation and treatment.  SUBJECTIVE His family is at the bedside.  Overall he feels his condition is stable.   OBJECTIVE Most recent Vital Signs: Filed Vitals:   07/16/13 2200 07/17/13 0000 07/17/13 0200 07/17/13 0400  BP:  159/83 140/66 154/98  Pulse: 72 74 89 90  Temp: 97.8 F (36.6 C) 97.9 F (36.6 C) 98.4 F (36.9 C) 97.5 F (36.4 C)  TempSrc:      Resp: 18 18 18 18   SpO2: 96% 97% 98% 96%   CBG (last 3)  No results found for this basename: GLUCAP,  in the last 72 hours  IV Fluid Intake:   . sodium chloride 125 mL/hr at 07/16/13 2156    MEDICATIONS  . aspirin EC  81 mg Oral Daily  . atorvastatin  80 mg Oral q1800  . enoxaparin (LOVENOX) injection  40 mg Subcutaneous Q24H   PRN:  senna-docusate  Diet:  General thin liquids Activity:  OOB with assistance DVT Prophylaxis:  Lovenox 40 mg sq daily  CLINICALLY SIGNIFICANT STUDIES Basic Metabolic Panel:  Recent Labs Lab 07/16/13 1019 07/16/13 1035 07/17/13 0520  NA 133* 136 135  K 4.7 4.7 3.8  CL 97 101 98  CO2 26  --  27  GLUCOSE 159* 165* 145*  BUN 14 13 15   CREATININE 1.06 1.20 1.03  CALCIUM 9.6  --  9.0   Liver  Function Tests:  Recent Labs Lab 07/16/13 1019  AST 23  ALT 31  ALKPHOS 76  BILITOT 0.2*  PROT 7.7  ALBUMIN 3.3*   CBC:  Recent Labs Lab 07/16/13 1019 07/16/13 1035 07/17/13 0520  WBC 11.3*  --  10.1  NEUTROABS 6.4  --  5.2  HGB 14.6 16.0 13.7  HCT 42.8 47.0 41.8  MCV 78.2  --  78.7  PLT 203  --  187   Coagulation:  Recent Labs Lab 07/16/13 1019  LABPROT 12.2  INR 0.92   Cardiac Enzymes:  Recent Labs Lab 07/16/13 1020  TROPONINI <0.30   Urinalysis:  Recent Labs Lab 07/17/13 0014  COLORURINE YELLOW  LABSPEC 1.014  PHURINE 6.5  GLUCOSEU NEGATIVE  HGBUR NEGATIVE  BILIRUBINUR NEGATIVE  KETONESUR NEGATIVE  PROTEINUR 100*  UROBILINOGEN 0.2  NITRITE NEGATIVE  LEUKOCYTESUR NEGATIVE   Lipid Panel    Component Value Date/Time   CHOL 254* 07/17/2013 0520   TRIG 1006* 07/17/2013 0520   HDL 28* 07/17/2013 0520   CHOLHDL 9.1 07/17/2013 0520   VLDL UNABLE TO CALCULATE IF TRIGLYCERIDE OVER 400 mg/dL 02/10/864 7846   LDLCALC UNABLE TO CALCULATE IF TRIGLYCERIDE OVER 400 mg/dL 9/62/9528 4132   [id number]  Lab Results  Component Value Date   HGBA1C 6.4 07/11/2013    Urine Drug Screen:  Component Value Date/Time   LABOPIA NONE DETECTED 07/17/2013 0014   COCAINSCRNUR NONE DETECTED 07/17/2013 0014   LABBENZ NONE DETECTED 07/17/2013 0014   AMPHETMU NONE DETECTED 07/17/2013 0014   THCU NONE DETECTED 07/17/2013 0014   LABBARB NONE DETECTED 07/17/2013 0014    Alcohol Level:  Recent Labs Lab 07/16/13 1019  ETH <11    CT of the brain  07/16/2013   Possible subacute left MCA distribution infarcts, as described above.  Additional multifocal chronic infarcts, small vessel ischemic changes, and intracranial atherosclerosis.    MRI of the brain  07/16/2013    1.  Confluent posterior left MCA and left MCA / PCA watershed acute infarcts corresponding to the cortically based abnormality on the recent head CT. 2.  Addition of multiple small punctate acute infarcts in both anterior  superior MCA territory is, as well as a 1-2 cm acute lacunar infarct in the right caudothalamic notch (also visible on CT), plus a  lacunar infarct in the genu of the corpus callosum (ACA territory).  These therefore suggest a recent  anterior circulation embolic shower. 3.  No mass effect or hemorrhage associated with the above. 4.  Underlying chronic small vessel ischemia.   MRA of the brain  07/16/2013  1.  Essentially negative anterior circulation.  Nondominant right ICA and ICA terminus.  No MCA branch occlusion or stenosis. 2.  Negative proximal posterior circulation, but moderate irregularity with tandem stenoses in the bilateral PCA branches. Bilateral preserved distal flow.   MRA of the neck  07/16/2013 1.  MRA suggests a high-grade stenosis at the right ICA origin but patent right ICA to the skull base.  See intracranial findings below. 2.  Left carotid bifurcation atherosclerosis without stenosis. 3.  Dominant right vertebral artery without evidence of hemodynamically significant vertebral artery stenosis.    2D Echocardiogram  EF 50-55% with no source of embolus.   TEE  Carotid Doppler  Right: 40-59% internal carotid artery stenosis. Left: 60-79% internal carotid artery stenosis. Bilateral: Vertebral artery flow is antegrade.  CXR    EKG  normal sinus rhythm, PAC's noted.   Therapy Recommendations CIR  Physical Exam   Middle aged [place] male not in distress.Awake alert. Afebrile. Head is nontraumatic. Neck is supple without bruit. Hearing is normal. Cardiac exam no murmur or gallop. Lungs are clear to auscultation. Distal pulses are well felt. Neurological Exam ; Awake alert speaks limited English and interpreter not avialable hence exam limited.speech not dysarthric. Follows commands well. Eye movements full. Mild right lower face weakness. RUE mild drift. Weakness right grip and intrinsic hand muscles. Mild right hip flexor and ankle dorsiflexor weakness. Decreased coordination on  right. Gait deferred. ASSESSMENT Juan [name] is a 62 y.o. male presenting with headache, RUE monoparesis. Imaging confirms a multiple bilateral cortical and subcortical infarcts - left with a watershed type distribution. Infarcts felt to be embolic secondary to an unknown source - may also have more than one source. On no antithrombotics prior to admission. Now on aspirin 81 mg orally every day for secondary stroke prevention. Patient with resultant right hemiparesis. Work up underway.   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 Hyperlipidemia, LDL unable to calculate; on no statin PTA, now on lipitor 80, goal LDL < 100   Hospital day # 1  TREATMENT/PLAN RECOMMENDATIONS  Continue aspirin 81 mg orally every day for secondary stroke prevention.  CT angiogram of the neck to further define carotid stenoses TEE to look for  embolic source. Please arrange with pts cardiologist or cardiologist of choice. If positive for PFO (patent foramen ovale), check bilateral lower extremity venous dopplers to rule out DVT as possible source of stroke.  Agree with recommendations for inpatient rehabilitation at time of discharge  Dr. Pearlean Brownie discussed diagnosis, prognosis,  treatment options and plan of care with Medical resident.   Annie Main, MSN, RN, ANVP-BC, ANP-BC, [facility] Pager: [phone] 07/17/2013 11:37 AM  I have personally obtained a history, examined the patient, evaluated imaging results, and formulated the assessment and plan of care. I agree with the above. Delia Heady, MD

## 2013-07-17 NOTE — Evaluation (Signed)
Clinical/Bedside Swallow Evaluation Patient Details  Name: Juan [name] MRN: [id number] Date of Birth: 1951/01/30  Today's Date: 07/17/2013 Time: 8657-8469 SLP Time Calculation (min): 20 min  Past Medical History: History reviewed. No pertinent past medical history. Past Surgical History:  Past Surgical History  Procedure Laterality Date  . Neck surgery  2003    Cord Compression   HPI:      Assessment / Plan / Recommendation Clinical Impression  Pt presents with a functional oropharyngeal swallow based on clinical swallow evaluation.  No clinical indications of aspiration, nor focal cn deficits that would impact swallow function.  Pt is mildly sluggish/slow in swallowing but is functional.  Given pt had problems swallowing pills *he states b/c pill was bitter, advise he take his mediicne with applesauce and follow with water currently.  SLP request Speech evaluation order given pt's MCA CVA and suspected language difficulties.   SLP educated pt and family to diet recommendations, signs of difficulties swallowing and compensation strategies.  Willl follow up x1 to assure tolerance.      Aspiration Risk  Mild    Diet Recommendation Regular;Thin liquid   Medication Administration: Whole meds with puree Supervision: Patient able to self feed Postural Changes and/or Swallow Maneuvers: Seated upright 90 degrees    Other  Recommendations Oral Care Recommendations: Oral care QID   Follow Up Recommendations       Frequency and Duration min 1 x/week  1 week   Pertinent Vitals/Pain Afebrile, decreased    SLP Swallow Goals Patient will utilize recommended strategies during swallow to increase swallowing safety with: Supervision/safety   Swallow Study Prior Functional Status   no dysphagia PTA per pt    General Date of Onset: 07/10/13 Type of Study: Bedside swallow evaluation Previous Swallow Assessment: 62 yo male adm to [facility] with RUE and RLE weakness and possible aphasia.  Pt CT head  showed multiple cvas involving left MCA , right caudothalamic notch.  Pt passed an RN stroke swallow screen but had difficulties swallowing pills.  Per MD order, pt had reported problems swallowing since Monday, however pt denies.  Pt states problems swallowing pills due to bitter taste.  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  His primary language is [place] but he is "pretty fluent in [place] " per his daughter.  Diet Prior to this Study: NPO Temperature Spikes Noted: No Respiratory Status: Room air History of Recent Intubation: No Behavior/Cognition: Alert;Cooperative;Pleasant mood Self-Feeding Abilities: Able to feed self Patient Positioning: Upright in bed Baseline Vocal Quality: Clear;Low vocal intensity Volitional Cough: Weak Volitional Swallow: Able to elicit    Oral/Motor/Sensory Function Overall Oral Motor/Sensory Function:  (generally sluggish, no focal deficits )   Ice Chips Ice chips: Not tested   Thin Liquid Thin Liquid: Within functional limits Presentation: Straw;Cup;Self Fed    Nectar Thick Nectar Thick Liquid: Not tested   Honey Thick Honey Thick Liquid: Not tested   Puree Puree: Within functional limits Presentation: Self Fed;Spoon Other Comments: slightly slow, but functional   Solid   GO    Solid: Within functional limits Presentation: Self Fed;Spoon Other Comments: slightly slow, but functional       Donavan Burnet, MS [facility] SLP [phone]

## 2013-07-17 NOTE — Progress Notes (Signed)
Rehab Admissions Coordinator Note:  Patient was screened by Trish Mage for appropriateness for an Inpatient Acute Rehab Consult.  Noted OT recommending CIR consult.   At this time, we are recommending Inpatient Rehab consult.  Trish Mage 07/17/2013, 3:56 PM  I can be reached at [phone].

## 2013-07-17 NOTE — Evaluation (Signed)
Physical Therapy Evaluation Patient Details Name: Juan [name] MRN: [id number] DOB: 16-Jan-1951 Today's Date: 07/17/2013 Time: 4098-1191 PT Time Calculation (min): 20 min  PT Assessment / Plan / Recommendation History of Present Illness  62 yo man presenting with weakness.  6 days PTA he had abrupt onset of right arm weakness, headache, and word-finding difficulty. Symptoms persisted unchanged until the morning of admission, when he experienced abrupt onset of right leg weakness as well. 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 These therefore suggest a recent anterior circulation embolic shower.    Clinical Impression  Pt admitted with multiple CVA's including Lt MCA. Pt currently with functional limitations due to the deficits listed below (see PT Problem List). Pt will benefit from skilled PT to increase their independence and safety with mobility to allow discharge to the venue listed below. Pt with very supportive family and will have assistance on d/c home.      PT Assessment  Patient needs continued PT services    Follow Up Recommendations  CIR;Supervision/Assistance - 24 hour    Does the patient have the potential to tolerate intense rehabilitation      Barriers to Discharge        Equipment Recommendations  None recommended by PT    Recommendations for Other Services Rehab consult   Frequency Min 4X/week    Precautions / Restrictions Precautions Precautions: Fall Precaution Comments: R hemiperesis Restrictions Weight Bearing Restrictions: No   Pertinent Vitals/Pain Denied Rt shoulder pain throughout.      Mobility  Bed Mobility Bed Mobility: Supine to Sit;Sitting - Scoot  to [place] of Bed;Sit to Supine Supine to Sit: With rails;HOB flat;4: Min assist Sitting - Scoot to Edge of Bed: 4: Min assist Sit to Supine: 4: Min assist;HOB flat Details for Bed Mobility Assistance: pt required incr time with all movements; incr effort; leaving RUE behind him as he came out of Lt side of bed Transfers Transfers: Sit to Stand;Stand to Sit Sit to Stand: 4: Min assist;From bed;From toilet Stand to Sit: To bed;To toilet;4: Min guard Details for Transfer Assistance: pt impulsively stood prior to instruction with imbalance requiring min assist to recover Ambulation/Gait Ambulation/Gait Assistance: 4: Min assist Ambulation Distance (Feet): 150 Feet Assistive device: None;Other (Comment) (support to RUE to protect joint integrity) Ambulation/Gait Assistance Details: support for RUE; assist at waist with turns due to loss of balance (especially turning to his Rt); ?pt distracted in busier environment making maintaining balance more difficult Gait Pattern: Decreased stride length;Step-through pattern (RLE in external rotation) Gait velocity: significantly decr Stairs: No (pt refused due to fatigue) Modified Rankin (Stroke Patients Only) Pre-Morbid Rankin Score: No symptoms Modified Rankin: Moderately severe disability    Exercises     PT Diagnosis: Difficulty walking  PT Problem List: Decreased strength;Decreased activity tolerance;Decreased balance;Decreased mobility;Decreased knowledge of use of DME;Decreased safety awareness PT Treatment Interventions: DME instruction;Gait training;Stair training;Functional mobility training;Therapeutic activities;Balance training;Neuromuscular re-education;Patient/family education     PT Goals(Current goals can be found in the care plan section) Acute Rehab PT Goals Patient Stated Goal: To get the use of his RUE back PT Goal Formulation: With patient Time For Goal Achievement: 07/24/13 Potential to Achieve Goals: Good  Visit  Information  Last PT Received On: 07/17/13 Assistance Needed: +1 History of Present Illness: 62 yo man presenting with weakness.  6 days PTA he had abrupt onset of right arm weakness, headache, and word-finding difficulty. Symptoms persisted unchanged until the morning of admission, when he experienced abrupt onset of right leg weakness as well. 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 These therefore suggest a recent anterior circulation embolic shower.        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 language deficits) Dominant Hand: Right    Cognition  Cognition Arousal/Alertness: Awake/alert Behavior During Therapy: Flat affect Overall Cognitive Status: Difficult to assess Difficult to assess due to: Impaired communication;Non-English speaking    Extremity/Trunk Assessment Upper Extremity Assessment Upper Extremity Assessment: Defer to OT evaluation Lower Extremity Assessment Lower Extremity Assessment: RLE deficits/detail RLE Deficits / Details: weakness compared to LLE (knee extension, hip flexion 4/5) RLE Sensation:  (denies changes) Cervical / Trunk  Assessment Cervical / Trunk Assessment: Normal   Balance Balance Balance Assessed: Yes Static Sitting Balance Static Sitting - Balance Support: No upper extremity supported;Feet supported Static Sitting - Level of Assistance: 5: Stand by assistance Static Standing Balance Static Standing - Balance Support: Right upper extremity supported Static Standing - Level of Assistance: 4: Min assist  End of Session PT - End of Session Equipment Utilized During Treatment: Gait belt Activity Tolerance: Patient limited by fatigue Patient left: in bed;with call bell/phone within reach;with family/visitor present  GP     Reida Hem 07/17/2013, 5:22 PM Pager [phone]

## 2013-07-17 NOTE — Evaluation (Signed)
Occupational Therapy Evaluation Patient Details Name: Juan [name] MRN: [id number] DOB: June 03, 1951 Today's Date: 07/17/2013 Time: 6213-0865 OT Time Calculation (min): 47 min  OT Assessment / Plan / Recommendation History of present illness 62 yo man, no known PMH, presenting with weakness. The patient notes that 6 days ago, at 6am, he had abrupt onset of right arm weakness, headache, and word-finding difficulty. Symptoms persisted unchanged until the morning of admission, when he experienced abrupt onset of right leg weakness as well. The patient's family notes that the right side of his face also looks "puffy", though no obvious facial droop. The patient notes no numbness/tingling, visual changes, dizziness, or imbalance.  The patient notes neck surgery in 2003 for cord compression. MRI (+)  Confluent posterior left MCA and left MCA / PCA watershed acute infarct. Addition of multiple small punctate acute infarcts in both anterior superior MCA territory is, as well as a 1-2 cm acute lacunar infarct in the right caudothalamic notch (also visible on CT), plus a lacunar infarct in the genu of the corpus callosum (ACA territory). These therefore suggest a recent anterior circulation embolic shower.    Clinical Impression   PTA Pt independent in mobility and ADL. From [place], came to [place] in 1992. Pt was lethargic and gave very short answers, daughter translated sometimes to make sure that Pt understood questions. Per daughter and wife, this is new since CVA, so communication was hard to assess. Pt very cooperative with therapy. RUE has impaired sensation and can only abduct 60 degrees from hip level. Pt with no finger flexion. At sink, Pt unable to perform BUE tasks without facilitation from OTS. Pt would benefit from continued skilled OT in the acute setting to facilitate RUE use, and independence in ADL. Pt would be an excellent CIR candidate.    OT Assessment  Patient needs continued OT Services    Follow Up  Recommendations  CIR          Recommendations for Other Services Rehab consult  Frequency  Min 3X/week    Precautions / Restrictions Precautions Precautions: Fall Precaution Comments: R hemiperesis Restrictions Weight Bearing Restrictions: No   Pertinent Vitals/Pain Pt reported no pain during the session.    ADL  Eating/Feeding: Supervision/safety (eating with LUE which is non-dominant, but able to complete) Where Assessed - Eating/Feeding: Chair Grooming: Wash/dry hands;Wash/dry face;Teeth care;Moderate assistance Where Assessed - Grooming: Supported standing Upper Body Bathing: Maximal assistance Where Assessed - Upper Body Bathing: Unsupported sitting Lower Body Bathing: Moderate assistance Where Assessed - Lower Body Bathing: Supported sitting Upper Body Dressing: Maximal assistance Where Assessed - Upper Body Dressing: Unsupported sitting Lower Body Dressing: Maximal assistance Where Assessed - Lower Body Dressing: Supported sit to Pharmacist, hospital: Moderate assistance Toilet Transfer Method: Sit to stand Toilet Transfer Equipment: Comfort height toilet;Grab bars Toileting - Clothing Manipulation and Hygiene: Moderate assistance Where Assessed - Toileting Clothing Manipulation and Hygiene: Standing Tub/Shower Transfer: Minimal assistance Tub/Shower Transfer Method: Science writer: Walk in shower Equipment Used: Gait belt Transfers/Ambulation Related to ADLs: Pt with left foot drop during mobilization from bed to bathroom for ADL, Pt min assist for sit <> stand, but RUE will fall limp at his side. ADL Comments: Pt motivated to brush teeth, and clean up. Pt unable to perform BUE grooming tasks, even with max vc's. OTS faciliated moving the RUE for the Pt. Pt completed hand washing and brushing teeth with facilitated BUE. Pt washed face using LUE. Pt stood supported at sink for tasks,  and reported that his RUE was not fatigued.     OT  Diagnosis: Generalized weakness;Hemiplegia dominant side  OT Problem List: Decreased strength;Decreased range of motion;Decreased coordination;Decreased knowledge of use of DME or AE;Impaired sensation;Impaired UE functional use OT Treatment Interventions: Self-care/ADL training;Therapeutic exercise;DME and/or AE instruction;Therapeutic activities;Patient/family education;Balance training   OT Goals(Current goals can be found in the care plan section) Acute Rehab OT Goals Patient Stated Goal: To get the use of his RUE back OT Goal Formulation: With patient/family Time For Goal Achievement: 07/17/13 Potential to Achieve Goals: Good ADL Goals Pt Will Perform Grooming: with modified independence;standing Pt Will Perform Upper Body Dressing: with supervision;sitting Pt Will Perform Lower Body Dressing: with min guard assist;sit to/from stand Pt Will Perform Toileting - Clothing Manipulation and hygiene: with supervision;sit to/from stand  Visit Information  Last OT Received On: 07/17/13 Assistance Needed: +1 History of Present Illness: 62 yo man, no known PMH, presenting with weakness. The patient notes that 6 days ago, at 6am, he had abrupt onset of right arm weakness, headache, and word-finding difficulty. Symptoms persisted unchanged until the morning of admission, when he experienced abrupt onset of right leg weakness as well. The patient's family notes that the right side of his face also looks "puffy", though no obvious facial droop. The patient notes no numbness/tingling, visual changes, dizziness, or imbalance.  The patient notes neck surgery in 2003 for cord compression. MRI (+)  Confluent posterior left MCA and left MCA / PCA watershed acute infarct. Addition of multiple small punctate acute infarcts in both anterior superior MCA territory is, as well as a 1-2 cm acute lacunar infarct in the right caudothalamic notch (also visible on CT), plus a lacunar infarct in the genu of the corpus  callosum (ACA territory). These therefore suggest a recent anterior circulation embolic shower.        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 Pt reports no change in vision   Cognition  Cognition Arousal/Alertness: Lethargic Behavior During Therapy: Flat affect Overall Cognitive Status: Difficult to assess Difficult to assess due to: Impaired communication;Non-English speaking    Extremity/Trunk Assessment Upper Extremity Assessment Upper Extremity Assessment: RUE deficits/detail RUE Deficits / Details: Able to abduct from hip to 60 degrees, but hangs limp at side during functional activities. Pt reports that arm is numb. Pt unable to squeeze fingers at all. RUE Sensation: decreased light touch;decreased proprioception RUE Coordination: decreased fine motor;decreased gross motor Lower Extremity Assessment Lower Extremity Assessment: Defer to PT evaluation Cervical / Trunk Assessment Cervical / Trunk Assessment: Normal     Mobility Bed Mobility Bed Mobility: Supine to Sit;Sitting - Scoot to Edge of Bed Supine  to Sit: With rails;HOB flat;3: Mod assist Sitting - Scoot to Edge of Bed: 3: Mod assist Details for Bed Mobility Assistance: Pt  with vc's for safety and mod assist Transfers Transfers: Sit to Stand;Stand to Sit Sit to Stand: 4: Min assist;From bed;From toilet Stand to Sit: 4: Min assist;To bed;To chair/3-in-1;To toilet Details for Transfer Assistance: Pt able to get legs under him and stand up min assist.           End of Session OT - End of Session Equipment Utilized During Treatment: Gait belt Activity Tolerance: Patient tolerated treatment well Patient left: in chair;with call bell/phone within reach;with family/visitor present Nurse Communication: Mobility status;Precautions  GO     Sherryl Manges 07/17/2013, 1:40 PM

## 2013-07-17 NOTE — Progress Notes (Signed)
Brief Nutrition Note:   RD pulled to chart for malnutrition screening tool report of unintentional weight loss. Pt states he has lost weight but unsure amount. Weight hx shows weight gain in the last week. States his appetite is normal.    Wt Readings from Last 5 Encounters:  07/17/13 165 lb (74.844 kg)  07/11/13 153 lb (69.4 kg)   Body mass index is 28.31 kg/(m^2). Overweight   Diet: Regular  Chart reviewed, no nutrition interventions warranted at this time. Please consult as needed.   Clarene Duke RD, LDN Pager [phone] After Hours pager [phone]

## 2013-07-17 NOTE — Evaluation (Signed)
I agree with the following treatment note after reviewing documentation.   Johnston, Quaneshia Wareing Brynn   OTR/L Pager: [phone] Office: [phone] .   

## 2013-07-17 NOTE — Progress Notes (Signed)
Subjective: Wife and daughter at bedside with patient today. Reports doing better this morning. Feels right leg weakness is less than yesterday; right arm still weak. Attempted to inquire about patient's mood symptoms but provided minimal responses; family members feel that he was just tired. Kept NPO last night due to problems swallowing medication but cleared swallow study this morning and restarted on medications.  Objective: Vital signs in last 24 hours: Filed Vitals:   07/16/13 2200 07/17/13 0000 07/17/13 0200 07/17/13 0400  BP:  159/83 140/66 154/98  Pulse: 72 74 89 90  Temp: 97.8 F (36.6 C) 97.9 F (36.6 C) 98.4 F (36.9 C) 97.5 F (36.4 C)  TempSrc:      Resp: 18 18 18 18   SpO2: 96% 97% 98% 96%   Physical Exam General: alert, cooperative, lying in bed HEENT: pupils equal round and reactive to light, vision grossly intact, oropharynx clear and non-erythematous  Neck: supple, no lymphadenopathy, JVD, or carotid bruits Heart: regular rate and rhythm, no murmurs, gallops, or rubs Neurologic: alert & oriented X3 grossly, facial droop (L>R) stable from yesterday, cranial nerves otherwise intact, RUE 2/5, & RLE 5/5 strength, sensation intact to light touch  Lab Results: BMET    Component Value Date/Time   NA 135 07/17/2013 0520   K 3.8 07/17/2013 0520   CL 98 07/17/2013 0520   CO2 27 07/17/2013 0520   GLUCOSE 145* 07/17/2013 0520   BUN 15 07/17/2013 0520   CREATININE 1.03 07/17/2013 0520   CALCIUM 9.0 07/17/2013 0520   GFRNONAA 75* 07/17/2013 0520   GFRAA 87* 07/17/2013 0520   CBC    Component Value Date/Time   WBC 10.1 07/17/2013 0520   RBC 5.31 07/17/2013 0520   HGB 13.7 07/17/2013 0520   HCT 41.8 07/17/2013 0520   PLT 187 07/17/2013 0520   MCV 78.7 07/17/2013 0520   MCH 25.8* 07/17/2013 0520   MCHC 32.8 07/17/2013 0520   RDW 14.0 07/17/2013 0520   LYMPHSABS 2.7 07/17/2013 0520   MONOABS 0.6 07/17/2013 0520   EOSABS 1.5* 07/17/2013 0520   BASOSABS 0.1 07/17/2013 0520    Urinalysis    Component Value Date/Time   COLORURINE YELLOW 07/17/2013 0014   APPEARANCEUR CLEAR 07/17/2013 0014   LABSPEC 1.014 07/17/2013 0014   PHURINE 6.5 07/17/2013 0014   GLUCOSEU NEGATIVE 07/17/2013 0014   HGBUR NEGATIVE 07/17/2013 0014   BILIRUBINUR NEGATIVE 07/17/2013 0014   KETONESUR NEGATIVE 07/17/2013 0014   PROTEINUR 100* 07/17/2013 0014   UROBILINOGEN 0.2 07/17/2013 0014   NITRITE NEGATIVE 07/17/2013 0014   LEUKOCYTESUR NEGATIVE 07/17/2013 0014   Studies/Results:  Carotid duplex US: right ICA stenosis 40-59% & left ICA stenosis 60-79%  TTE:  Left ventricle: The cavity size was normal. Wall thickness was increased in a pattern of mild LVH. Systolic function was normal. The estimated ejection fraction was in the range of 50% to 55%. Wall motion was normal; there were no regional wall motion abnormalities. Doppler parameters are consistent with abnormal left ventricular relaxation (grade 1 diastolic dysfunction). Transthoracic echocardiography. M-mode, complete 2D, spectral Doppler, and color Doppler. Weight: Weight: 69.4kg. Weight: 152.7lb. Blood pressure: 153/83. Patient status: Outpatient. Location: Echo laboratory.  ------------------------------------------------------------  ------------------------------------------------------------ Left ventricle: The cavity size was normal. Wall thickness was increased in a pattern of mild LVH. Systolic function was normal. The estimated ejection fraction was in the range of 50% to 55%. Wall motion was normal; there were no regional wall motion abnormalities. Doppler parameters are consistent with abnormal left ventricular relaxation (grade  1 diastolic dysfunction).  ------------------------------------------------------------ Aortic valve: Trileaflet; normal thickness leaflets. Mobility was not restricted. Doppler: Transvalvular velocity was within the normal range. There was no stenosis. No  regurgitation.  ------------------------------------------------------------ Aorta: Aortic root: The aortic root was normal in size.  ------------------------------------------------------------ Mitral valve: Structurally normal valve. Mobility was not restricted. Doppler: Transvalvular velocity was within the normal range. There was no evidence for stenosis. Trivial regurgitation. Peak gradient: 3mm Hg (D).  ------------------------------------------------------------ Left atrium: The atrium was normal in size.  ------------------------------------------------------------ Right ventricle: The cavity size was normal. Systolic function was normal.  ------------------------------------------------------------ Pulmonic valve: Doppler: Transvalvular velocity was within the normal range. There was no evidence for stenosis. Trivial regurgitation.  ------------------------------------------------------------ Tricuspid valve: Structurally normal valve. Doppler: Transvalvular velocity was within the normal range. Trivial regurgitation.  ------------------------------------------------------------ Right atrium: The atrium was normal in size.  ------------------------------------------------------------ Pericardium: There was no pericardial effusion.  ------------------------------------------------------------ Systemic veins: Inferior vena cava: The vessel was normal in size.  Medications: I have reviewed the patient's current medications. Scheduled Meds: . aspirin EC  81 mg Oral Daily  . atorvastatin  80 mg Oral q1800  . enoxaparin (LOVENOX) injection  40 mg Subcutaneous Q24H   Continuous Infusions: . sodium chloride 125 mL/hr at 07/16/13 2156   PRN Meds:.iohexol, senna-docusate  Assessment/Plan: 62 year old male recently found to have pre-diabetes, HTN, and dyslipidemia admitted for right-sided arm and leg weakness 2/2 acute ischemic stroke with possible depression.  #Acute  ischemic stroke: Bilateral stroke likely 2/2 atherosclerotic changes closer to the aortic arch.  -Per neuro, ordered neck CTA to reconcile differing results between MRI/MRA and carotid duplex -Ordered TEE given inconclusive findings and high suspicion -PT/OT recommended continued inpatient rehab but case management recommended home with health services -Continue ASA & atorvastatin given cleared swallow study -Continue neuro checks q4h -Appreciate neuro recs  #Depression: Could not assess for SIGECAPS criteria given patient's demeanor and family members present. -Will continue watching during hospital stay if SSRI therapy warranted  #Pre-diabetes: A1c 6.4. Will address lifestyle factors and need to establish care with PCP.  #HTN: BP 150s/80s. Allow for permissive HTN and will consider BP control tomorrow past 48-hour window.   #Dyslipidemia: Repeat lipid panel per neuro confirms last week's findings with total cholesterol 254. Will need to establish care pending discharge    This is a Psychologist, occupational Note.  The care of the patient was discussed with Dr. Doneen Poisson and the assessment and plan formulated with their assistance.  Please see their attached note for official documentation of the daily encounter.   LOS: 1 day   Jacinta Shoe, Med Student 07/17/2013, 1:32 PM

## 2013-07-17 NOTE — Progress Notes (Addendum)
Bilateral carotid artery duplex completed.  Right:  40-59% internal carotid artery stenosis.  Left:  60-79% internal carotid artery stenosis.  Bilateral:  Vertebral artery flow is antegrade.

## 2013-07-17 NOTE — Progress Notes (Signed)
Internal Medicine Attending Progress Note Date: 07/17/2013  Patient name: Juan [name] Medical record number: [id number] Date of birth: 1951-01-15 Age: 62 y.o. Gender: male  I saw and evaluated the patient. I reviewed the student's note and I agree with the student's findings and plan as documented in the student's note.  S: Mr. Stannard notes slightly subjective improvement in his RUE strength and more marked improvement in his RLE strength.  Had issue with swallowing his pills last night but passed the swallowing examination today.  Physical Exam:  Filed Vitals:   07/17/13 0200 07/17/13 0400 07/17/13 1300 07/17/13 1400  BP: 140/66 154/98  125/79  Pulse: 89 90  80  Temp: 98.4 F (36.9 C) 97.5 F (36.4 C)  98.7 F (37.1 C)  TempSrc:    Oral  Resp: 18 18  18   Height:   5\' 4"  (1.626 m)   Weight:   165 lb (74.844 kg)   SpO2: 98% 96%  100%   Gen: WDWN man lying comfortably in bed in NAD.  Affect is blunted and spontaneous movements are minimal. HEENT: Slight right facial droop, sensation in tact. Lungs: CTA B anteriorly CV: RRR w/o murmurs, rubs, or gallops Neuro: 3/5 RUE strength, 5/5 LUE strength, 4+/5 RLE strength, 5/5 LLE strength  Lab results:  Basic Metabolic Panel:  Recent Labs  40/98/11 1019 07/16/13 1035 07/17/13 0520  NA 133* 136 135  K 4.7 4.7 3.8  CL 97 101 98  CO2 26  --  27  GLUCOSE 159* 165* 145*  BUN 14 13 15   CREATININE 1.06 1.20 1.03  CALCIUM 9.6  --  9.0   CBC:  Recent Labs  07/16/13 1019 07/16/13 1035 07/17/13 0520  WBC 11.3*  --  10.1  NEUTROABS 6.4  --  5.2  HGB 14.6 16.0 13.7  HCT 42.8 47.0 41.8  MCV 78.2  --  78.7  PLT 203  --  187   Fasting Lipid Panel:  Recent Labs  07/17/13 0520  CHOL 254*  HDL 28*  LDLCALC UNABLE TO CALCULATE IF TRIGLYCERIDE OVER 400 mg/dL  TRIG 9147*  CHOLHDL 9.1   Imaging results:   Reviewed.  Please see imaging reports section for details.  Other results:  EKG: NSR, LVH by voltage in aVL  Assessment &  Plan by Problem:  1) Acute/subacute CVA: Embolic shower likely from very proximal ascending aorta or heart.  TEE has been ordered to better assess for a cardiac source.  Will also order CT angiogram of the carotids given conflicting data from MRA and Dopplers as this could affect recommended management.  Will continue the permissive hypertension, ASA and the statin therapy although we may make some modifications in order to address the markedly elevated triglyceride level as well.  It should be noted that his non-HDL LDL is elevated at 226 which is nearly 100 points higher than target, so statin therapy is also clearly indicated, even with the hypertriglyceridemia.  Further recommendations for management are pending the results of the TEE and CT carotid angiogram.  2) Depression: Mr. Modi affect is a bit unusual and could be a reflection of underlying depression s/p his CVA.  We will continue to try to explore this with him while he is an inpatient in case pharmacotherapy is indicated.  3) Disposition: PT/OT has recommended inpatient rehab.  He is being evaluated for his candidacy for such.

## 2013-07-17 NOTE — Progress Notes (Signed)
   CARE MANAGEMENT NOTE 07/17/2013  Patient:  Juan [name], Juan [name]   Account Number:  [id number]  Date Initiated:  07/17/2013  Documentation initiated by:  Jiles Crocker  Subjective/Objective Assessment:   ADMITTED WITH right upper extremity weakness and HA     Action/Plan:   LIVES AT HOME WITH SPOUSE; STROKE WORK UP IN PROGRESS; CM FOLLOWING FOR DCP   Anticipated DC Date:  07/21/2013   Anticipated DC Plan:  HOME W HOME HEALTH SERVICES      DC Planning Services  CM consult             Status of service:  In process, will continue to follow Medicare Important Message given?  NA - LOS <3 / Initial given by admissions (If response is "NO", the following Medicare IM given date fields will be blank)  Per UR Regulation:  Reviewed for med. necessity/level of care/duration of stay  Comments:  07/17/2013- B Shahid Flori RN,BSN,MHA

## 2013-07-17 NOTE — Progress Notes (Signed)
Echo Lab  2D Echocardiogram completed.  Doran Nestle L Tocarra Gassen, RDCS 07/17/2013 12:28 PM

## 2013-07-18 DIAGNOSIS — I6529 Occlusion and stenosis of unspecified carotid artery: Secondary | ICD-10-CM

## 2013-07-18 LAB — BASIC METABOLIC PANEL
BUN: 15 mg/dL (ref 6–23)
GFR calc Af Amer: >90 mL/min (ref >90)
GFR calc non Af Amer: 78 mL/min — ABNORMAL LOW (ref >90)
Potassium: 3.8 mEq/L (ref 3.5–5.1)

## 2013-07-18 MED ORDER — SODIUM CHLORIDE 0.9 % IV SOLN
INTRAVENOUS | Status: DC
  Administered 2013-07-20: 10:00:00 via INTRAVENOUS

## 2013-07-18 NOTE — Progress Notes (Signed)
   I have seen the patient and reviewed the excellent daily progress note by Heywood Iles MS4 and discussed the care of the patient with them.  See below for documentation of my findings, assessment, and plans.  Subjective: The patient notes some increased strength in his right arm compared to yesterday.  CTA neck yesterday showed significant ICA stenosis; will consult vascular today for consideration for CEA.  The patient was recommended for CIR, but as he is self-pay, there was some question as to whether he would be able to afford this.  Objective: Vital signs in last 24 hours: Filed Vitals:   07/17/13 2200 07/18/13 0200 07/18/13 0600 07/18/13 0856  BP: 135/83 124/67 137/64 132/66  Pulse: 83 82 87 69  Temp: 98.9 F (37.2 C) 98.3 F (36.8 C) 98.4 F (36.9 C) 98.3 F (36.8 C)  TempSrc:    Oral  Resp: 18 18 18 18   Height:      Weight:      SpO2: 97% 95% 95% 97%   Weight change:  No intake or output data in the 24 hours ending 07/18/13 1154 General: lying in bed, no acute distress HEENT: pupils equal round and reactive to light, vision grossly intact, oropharynx clear and non-erythematous  Neck: supple, no lymphadenopathy Lungs: clear to ascultation bilaterally, normal work of respiration, no wheezes, rales, ronchi Heart: regular rate and rhythm, no murmurs, gallops, or rubs Abdomen: soft, non-tender, non-distended, normal bowel sounds Extremities: no cyanosis, clubbing, or edema Neurologic: alert & oriented X3, cranial nerves II-XII grossly intact with mild right facial droop, strength 3/5 in right upper extremity, otherwise 5/5, sensation intact to light touch  Lab Results: Reviewed and documented in Electronic Record Micro Results: Reviewed and documented in Electronic Record Studies/Results: Reviewed and documented in Electronic Record Medications: I have reviewed the patient's current medications. Scheduled Meds: . aspirin EC  81 mg Oral Daily  . atorvastatin  80 mg  Oral q1800  . enoxaparin (LOVENOX) injection  40 mg Subcutaneous Q24H   Continuous Infusions: . sodium chloride 125 mL/hr at 07/16/13 2156   PRN Meds:.senna-docusate Assessment/Plan: The patient is a 62 yo M, no known PMH, presenting with CVA.   # Subacute CVA - symptoms started 6 days PTA, but worsened on the day of admission. Symptoms include right upper and lower extremity weakness, and possible word-finding difficulty, though symptoms are starting to improve. CT/MRI shows shows acute/subacute L MCA distribution infarcts, with some right-sided infarcts as well.  CTA neck shows bilateral ICA stenosis -Neuro consulted, appreciate recs  -plan for TEE in AM -continue aspirin, atorvastatin  -PT/OT consults, will consult CIR today -consulted vascular surgery today for consideration of CEA  Dispo: Disposition is deferred at this time, awaiting improvement of current medical problems.  Anticipated discharge in approximately 2-3 day(s).   The patient does not have a current PCP (No primary provider on file.) and does need an [facility] hospital follow-up appointment after discharge.   .Services Needed at time of discharge: Y = Yes, Blank = No PT:   OT:   RN:   Equipment:   Other:     LOS: 2 days   Linward Headland, MD 07/18/2013, 11:54 AM

## 2013-07-18 NOTE — Progress Notes (Signed)
Stroke Team Progress Note  HISTORY Juan [name] is a 62 y.o. male with a past medical history significant for cervical spine surgery who was in his usual state of health until 6 days ago when he awoke with a severe, pounding HA and collapsed to the floor without impairment of consciousness. Then he developed weakness of the right upper extremity and he has been unable to use the right arm ever since. Never had similar symptoms before Denies nausea, vomiting, double vision, slurred speech, language or vision impairment but still experiencing HA.  No recent fever, infection, or head-neck trauma. CT brain revealed possible subacute left MCA distribution infarct. History reviewed. No pertinent past medical history.Patient was not a TPA candidate secondary to delay in arrival. He was admitted for further evaluation and treatment.  SUBJECTIVE Family member at bedside. The patient is able to speak and understand English but is not fluent.  OBJECTIVE Most recent Vital Signs: Filed Vitals:   07/17/13 1800 07/17/13 2200 07/18/13 0200 07/18/13 0600  BP: 121/76 135/83 124/67 137/64  Pulse: 75 83 82 87  Temp: 98 F (36.7 C) 98.9 F (37.2 C) 98.3 F (36.8 C) 98.4 F (36.9 C)  TempSrc: Oral     Resp: 18 18 18 18   Height:      Weight:      SpO2: 100% 97% 95% 95%   CBG (last 3)  No results found for this basename: GLUCAP,  in the last 72 hours  IV Fluid Intake:   . sodium chloride 125 mL/hr at 07/16/13 2156    MEDICATIONS  . aspirin EC  81 mg Oral Daily  . atorvastatin  80 mg Oral q1800  . enoxaparin (LOVENOX) injection  40 mg Subcutaneous Q24H   PRN:  senna-docusate  Diet:  NPO thin liquids Activity:  OOB with assistance DVT Prophylaxis:  Lovenox 40 mg sq daily  CLINICALLY SIGNIFICANT STUDIES Basic Metabolic Panel:   Recent Labs Lab 07/17/13 0520 07/18/13 0615  NA 135 135  K 3.8 3.8  CL 98 100  CO2 27 26  GLUCOSE 145* 144*  BUN 15 15  CREATININE 1.03 1.00  CALCIUM 9.0 9.3   Liver  Function Tests:   Recent Labs Lab 07/16/13 1019  AST 23  ALT 31  ALKPHOS 76  BILITOT 0.2*  PROT 7.7  ALBUMIN 3.3*   CBC:   Recent Labs Lab 07/16/13 1019 07/16/13 1035 07/17/13 0520  WBC 11.3*  --  10.1  NEUTROABS 6.4  --  5.2  HGB 14.6 16.0 13.7  HCT 42.8 47.0 41.8  MCV 78.2  --  78.7  PLT 203  --  187   Coagulation:   Recent Labs Lab 07/16/13 1019  LABPROT 12.2  INR 0.92   Cardiac Enzymes:   Recent Labs Lab 07/16/13 1020  TROPONINI <0.30   Urinalysis:   Recent Labs Lab 07/17/13 0014  COLORURINE YELLOW  LABSPEC 1.014  PHURINE 6.5  GLUCOSEU NEGATIVE  HGBUR NEGATIVE  BILIRUBINUR NEGATIVE  KETONESUR NEGATIVE  PROTEINUR 100*  UROBILINOGEN 0.2  NITRITE NEGATIVE  LEUKOCYTESUR NEGATIVE   Lipid Panel    Component Value Date/Time   CHOL 254* 07/17/2013 0520   TRIG 1006* 07/17/2013 0520   HDL 28* 07/17/2013 0520   CHOLHDL 9.1 07/17/2013 0520   VLDL UNABLE TO CALCULATE IF TRIGLYCERIDE OVER 400 mg/dL 1/61/0960 4540   LDLCALC UNABLE TO CALCULATE IF TRIGLYCERIDE OVER 400 mg/dL 9/81/1914 7829   FAOZ3Y  Lab Results  Component Value Date   HGBA1C 6.4 07/11/2013  Urine Drug Screen:     Component Value Date/Time   LABOPIA NONE DETECTED 07/17/2013 0014   COCAINSCRNUR NONE DETECTED 07/17/2013 0014   LABBENZ NONE DETECTED 07/17/2013 0014   AMPHETMU NONE DETECTED 07/17/2013 0014   THCU NONE DETECTED 07/17/2013 0014   LABBARB NONE DETECTED 07/17/2013 0014    Alcohol Level:   Recent Labs Lab 07/16/13 1019  ETH <11    CT of the brain  07/16/2013   Possible subacute left MCA distribution infarcts, as described above.  Additional multifocal chronic infarcts, small vessel ischemic changes, and intracranial atherosclerosis.    MRI of the brain  07/16/2013    1.  Confluent posterior left MCA and left MCA / PCA watershed acute infarcts corresponding to the cortically based abnormality on the recent head CT. 2.  Addition of multiple small punctate acute infarcts in  both anterior superior MCA territory is, as well as a 1-2 cm acute lacunar infarct in the right caudothalamic notch (also visible on CT), plus a  lacunar infarct in the genu of the corpus callosum (ACA territory).  These therefore suggest a recent  anterior circulation embolic shower. 3.  No mass effect or hemorrhage associated with the above. 4.  Underlying chronic small vessel ischemia.   MRA of the brain  07/16/2013  1.  Essentially negative anterior circulation.  Nondominant right ICA and ICA terminus.  No MCA branch occlusion or stenosis. 2.  Negative proximal posterior circulation, but moderate irregularity with tandem stenoses in the bilateral PCA branches. Bilateral preserved distal flow.   MRA of the neck  07/16/2013 1.  MRA suggests a high-grade stenosis at the right ICA origin but patent right ICA to the skull base.  See intracranial findings below. 2.  Left carotid bifurcation atherosclerosis without stenosis. 3.  Dominant right vertebral artery without evidence of hemodynamically significant vertebral artery stenosis.    2D Echocardiogram  EF 50-55% with no source of embolus.   TEE - planned for today  Carotid Doppler  Right: 40-59% internal carotid artery stenosis. Left: 60-79% internal carotid artery stenosis. Bilateral: Vertebral artery flow is antegrade.  CXR    EKG  normal sinus rhythm, PAC's noted.   Therapy Recommendations CIR  Physical Exam   Middle aged [place] male not in distress.Awake alert. Afebrile. Head is nontraumatic. Neck is supple without bruit. Hearing is normal. Cardiac exam no murmur or gallop. Lungs are clear to auscultation. Distal pulses are well felt. Neurological Exam ; Awake alert speaks limited English and interpreter not avialable hence exam limited.speech not dysarthric. Follows commands well. Eye movements full. Mild right lower face weakness. RUE mild drift. Weakness right grip and intrinsic hand muscles. Mild right hip flexor and ankle dorsiflexor  weakness. Decreased coordination on right. Gait deferred.    ASSESSMENT Mr. Juan [name] is a 62 y.o. male presenting with headache, RUE monoparesis. Imaging confirms a multiple bilateral cortical and subcortical infarcts - left with a watershed type distribution. Infarcts felt to be embolic secondary to an unknown source - may also have more than one source. On no antithrombotics prior to admission. Now on aspirin 81 mg orally every day for secondary stroke prevention. Patient with resultant right hemiparesis. Work up underway.   Bilateral ICA stenosis - see the results of the CT angiogram of the neck as noted below. Hyperlipidemia, LDL unable to calculate; on no statin PTA, now on lipitor 80, goal LDL < 100  Hypertension Prediabetes - hemoglobin A1c 6.4  Hospital day # 2  TREATMENT/PLAN  RECOMMENDATIONS  Continue aspirin 81 mg orally every day for secondary stroke prevention.  CT angiogram of the neck performed 07/17/2013. Right internal carotid artery 84% stenosis. Left internal carotid artery 72% stenosis. The teaching service has consulted vascular surgery.  Possible 1.2 cm right thyroid mass by CT angiogram.  TEE to look for embolic source. Please arrange with pts cardiologist or cardiologist of choice. If positive for PFO (patent foramen ovale), check bilateral lower extremity venous dopplers to rule out DVT as possible source of stroke.  Agree with recommendations for inpatient rehabilitation at time of discharge - inpatient acute rehabilitation consult recommended by rehabilitation admissions coordinator. Patient is self pay. Risk factor modification Vascular surgery consult for left CEA in 2 weeks and Rt CEA later in 6 weeks.    Delton See PA-C [facility] Pager [phone] 07/18/2013, 8:43 AM  I have personally obtained a history, examined the patient, evaluated imaging results, and formulated the assessment and plan of care. I agree with the above.  Delia Heady, MD

## 2013-07-18 NOTE — Progress Notes (Signed)
Subjective: Wife present with patient. Continues to note feeling "better." Denies any pain or concerns. NPO this morning for possible TEE.  Spoke to neuro (Dr. Pearlean Brownie) who agreed with consulting vascular surgery in light of CTA neck findings of bilateral vascular disease.   Objective: Vital signs in last 24 hours: Filed Vitals:   07/17/13 1400 07/17/13 1800 07/17/13 2200 07/18/13 0200  BP: 125/79 121/76 135/83 124/67  Pulse: 80 75 83 82  Temp: 98.7 F (37.1 C) 98 F (36.7 C) 98.9 F (37.2 C) 98.3 F (36.8 C)  TempSrc: Oral Oral    Resp: 18 18 18 18   Height:      Weight:      SpO2: 100% 100% 97% 95%   Physical Exam General: alert, cooperative, lying in bed HEENT: pupils equal round and reactive to light, vision grossly intact, oropharynx clear and non-erythematous  Heart: regular rate and rhythm, no murmurs, gallops, or rubs Neurologic: alert & oriented X3 grossly, facial droop (L>R) stable from yesterday, cranial nerves otherwise intact, RUE 3/5, RLE 5/5, LUE & LLE 5/5 strength, sensation intact to light touch  Lab Results: BMET    Component Value Date/Time   NA 135 07/18/2013 0615   K 3.8 07/18/2013 0615   CL 100 07/18/2013 0615   CO2 26 07/18/2013 0615   GLUCOSE 144* 07/18/2013 0615   BUN 15 07/18/2013 0615   CREATININE 1.00 07/18/2013 0615   CALCIUM 9.3 07/18/2013 0615   GFRNONAA 78* 07/18/2013 0615   GFRAA >90 07/18/2013 0615   Studies/Results:  CT ANGIOGRAPHY NECK  IMPRESSION:  Carotid bifurcation complex plaque bilaterally.   84% diameter stenosis proximal right internal carotid artery.   72% diameter stenosis proximal left internal carotid artery. There may be an ulceration just beyond this left internal carotid artery proximal stenosis.   Beyond the bifurcation, there is decreased caliber of the right internal carotid artery compared to the left.   Calcified plaque petrous and cavernous segment of the internal carotid artery bilaterally.   Plaque with mild  narrowing proximal right subclavian artery.   Minimal narrowing proximal right vertebral artery.   No significant narrowing of the left subclavian artery.   Mild narrowing proximal left vertebral artery (nondominant vertebral artery).   Heterogeneous enlarged thyroid gland with substernal extension  suggestive of a goiter. Left lobe calcification. Right lobe with questionable 1.2 cm dominant mass. This can be followed by thyroid ultrasound.   Prior surgery of fusion C4-C7. Spinal stenosis at several levels with cord flattening incompletely assessed.  Original Report Authenticated By: Lacy Duverney, M.D.  Medications: I have reviewed the patient's current medications. Scheduled Meds: . aspirin EC  81 mg Oral Daily  . atorvastatin  80 mg Oral q1800  . enoxaparin (LOVENOX) injection  40 mg Subcutaneous Q24H   Continuous Infusions: . sodium chloride 125 mL/hr at 07/16/13 2156   PRN Meds:.senna-docusate  Assessment/Plan: 62 year old [place] male recently found to have pre-diabetes, HTN, and dyslipidemia admitted for right-sided arm and leg weakness 2/2 acute ischemic stroke with possible depression and thyroid nodule.  #Acute ischemic stroke: Bilateral stroke likely 2/2 atherosclerotic changes at the carotid bifurcation (R > L) and proximal right subclavian/vertebral artery.   -Consult vascular surgery for possible intervention -TEE scheduled for tomorrow so will discontinue NPO today and restart tonight at midnight -PT/OT recommended continued inpatient rehab and will order consult for today -Continue ASA & atorvastatin -Continue neuro checks q4h -Appreciate neuro recs  #Pre-diabetes: A1c 6.4. Will address lifestyle factors and need to  establish care with PCP.  #HTN: BP 140+/90+ at office visit last week though trending lower now. Will continue watching and treat accordingly.   #Dyslipidemia: Hypertriglycedemia (TG 1000) concerning with risk of acute pancreatitis. Discussed  possible combined statin/fibrate treatment to lower TG but will pursue statin monotherapy given limited medical care and increased risk of myopathy. Will need to recheck lipids at f/u outpatient visit.       #Depression: Appeared much more brighter and responsive this morning but unable to assess per DSM-V criteria. -Will continue watching during hospital stay if SSRI therapy warranted  #Thyroid nodule: Found on CTA neck yesterday that may look like goiter. Recommend TSH check at outpatient f/u  #Disposition: Pending workup and intervention but possible in 2-3 days.  This is a Psychologist, occupational Note.  The care of the patient was discussed with Dr. Doneen Poisson and the assessment and plan formulated with their assistance.  Please see their attached note for official documentation of the daily encounter.   LOS: 2 days   Jacinta Shoe, Med Student 07/18/2013, 7:43 AM

## 2013-07-18 NOTE — Progress Notes (Signed)
Internal Medicine Attending  Date: 07/18/2013  Patient name: Juan [name] Medical record number: [id number] Date of birth: July 04, 1951 Age: 62 y.o. Gender: male  I saw and evaluated the patient. I reviewed the resident's note by Dr. Manson Passey and I agree with the resident's findings and plans as documented in his progress note.  [name] has persistent RUE weakness that is not very different today on my examination than on yesterday's exam.  CT angiogram of the carotids suggests bilateral disease meeting criteria for CEA consideration.  TEE to look for a cardiac source for the anterior circulation embolic shower that recently occurred pending.  Continuing ASA and working on home PT in anticipation of discharge if CEA is not planned within next few days.

## 2013-07-18 NOTE — Progress Notes (Signed)
Physical Therapy Treatment Patient Details Name: Juan [name] MRN: [id number] DOB: 1951/02/18 Today's Date: 07/18/2013 Time: 4098-1191 PT Time Calculation (min): 18 min  PT Assessment / Plan / Recommendation  History of Present Illness 62 yo man presenting with weakness.  6 days PTA he had abrupt onset of right arm weakness, headache, and word-finding difficulty. Symptoms persisted unchanged until the morning of admission, when he experienced abrupt onset of right leg weakness as well. 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 These therefore suggest a recent anterior circulation embolic shower.    Clinical Impression Pt continues to improve. He had trace movement in his Rt thumb today. ADLs will continue to be most difficult (as evidenced by his attempt to don his socks), however from a general mobility standpoint, he is doing well and could manage at home with his family's assistance.   PT Comments     Follow Up Recommendations  Supervision/Assistance - 24 hour;Home health PT     Does the patient have the potential to tolerate intense rehabilitation     Barriers to Discharge        Equipment Recommendations  None recommended by PT    Recommendations for Other Services    Frequency Min 4X/week   Progress towards PT Goals Progress towards PT goals: Progressing toward goals  Plan Discharge plan needs to be updated    Precautions / Restrictions Precautions Precautions: Fall Precaution Comments: R hemiperesis Restrictions Weight Bearing Restrictions: No   Pertinent Vitals/Pain Denied pain    Mobility  Bed Mobility Bed Mobility: Supine to Sit;Sitting - Scoot to [place] of Bed;Sit to  Supine Supine to Sit: With rails;HOB flat;5: Supervision Sitting - Scoot to Edge of Bed: 5: Supervision Sit to Supine: HOB flat;5: Supervision Details for Bed Mobility Assistance: pt required incr time with all movements; incr effort; leaving RUE behind him as he came out of Lt side of bed Transfers Transfers: Sit to Stand;Stand to Sit Sit to Stand: From bed;4: Min guard Stand to Sit: To bed;4: Min guard Details for Transfer Assistance: pt with no sway or LOB with transfers x 3 Ambulation/Gait Ambulation/Gait Assistance: 4: Min guard Ambulation Distance (Feet): 150 Feet Assistive device: None Ambulation/Gait Assistance Details: Pt denied Rt shoulder pain with no subluxation noted therefore did not support UE while walking; pt with straight path; unable to vary his speed (up or down); was able to complete 180 deg turns and quick stops without problems Gait Pattern: Decreased stride length;Step-through pattern (RLE in external rotation) Gait velocity: decr Stairs: Yes (pt refused due to fatigue) Stairs Assistance: 4: Min guard Stair Management Technique: One rail Left;Forwards;Alternating pattern (pt lightly using rail; replaced his hand on rail when remove) Number of Stairs: 9 Modified Rankin (Stroke Patients Only) Pre-Morbid Rankin Score: No symptoms Modified Rankin: Moderately severe disability    Exercises     PT Diagnosis:    PT Problem List:   PT Treatment Interventions:     PT Goals (current goals can now be found in the care plan section) Acute Rehab PT Goals Patient Stated Goal: To get the use of his RUE back  Visit Information  Last PT Received On: 07/18/13 Assistance Needed: +1 History of Present Illness: 62  yo man presenting with weakness.  6 days PTA he had abrupt onset of right arm weakness, headache, and word-finding difficulty. Symptoms persisted unchanged until the morning of admission, when he experienced abrupt onset of right leg weakness as well. PMHx neck  surgery in 2003 for cord compression. 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 These therefore suggest a recent anterior circulation embolic shower.     Subjective Data  Subjective: Family report he is speaking better today Patient Stated Goal: To get the use of his RUE back   Cognition  Cognition Arousal/Alertness: Awake/alert Behavior During Therapy: Flat affect Overall Cognitive Status: Difficult to assess Difficult to assess due to: Impaired communication;Non-English speaking (speaking/understanding more English)    Balance  Balance Balance Assessed: Yes Static Sitting Balance Static Sitting - Balance Support: No upper extremity supported;Feet supported Static Sitting - Level of Assistance: 7: Independent Dynamic Sitting Balance Dynamic Sitting - Balance Support: No upper extremity supported;Feet supported Dynamic Sitting - Level of Assistance: 5: Stand by assistance Dynamic Sitting Balance - Compensations: able to reach to floor attempting to don sock; also able to bring foot to his knee Static Standing Balance Static Standing - Balance Support: No upper extremity supported Static Standing - Level of Assistance: 5: Stand by assistance Single Leg Stance - Left Leg: 30 (light hand held assist) [place] - Eyes Opened: 30 [place] - Eyes Closed: 10 (incr lateral sway to Lt)  End of Session PT - End of Session Equipment Utilized During Treatment: Gait belt Activity Tolerance: Patient tolerated treatment well Patient left: in bed;with call bell/phone within reach;with family/visitor present Nurse Communication: Mobility status   GP     Takasha Vetere 07/18/2013, 4:56 PM Pager [phone]

## 2013-07-18 NOTE — Consult Note (Signed)
Physical Medicine and Rehabilitation Consult Reason for Consult: CVA Referring Physician: [facility]   HPI: Juan [name] is a 62 y.o. right-handed male with unremarkable past medical history admitted 07/16/2013 with headache and right upper extremity weakness. 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 MRA of the head negative. MRA of the neck with high-grade stenosis at the right ICA origin but patent right ICA to the skull base. Echocardiogram with ejection fraction of 55% grade 1 diastolic dysfunction. Carotid Dopplers with right 40-59% and left 60-79% ICA stenosis. Patient did not receive TPA. Neurology services consulted placed on aspirin therapy for CVA prophylaxis as well as subcutaneous Lovenox for DVT prophylaxis. TEE is pending. Patient maintained on a regular consistency diet. Physical occupational therapy evaluations completed with recommendations for physical medicine rehabilitation consult to consider inpatient rehabilitation services.   Review of Systems  HENT: Positive for neck pain.   Neurological: Positive for weakness and headaches.  All other systems reviewed and are negative.   History reviewed. No pertinent past medical history. Past Surgical History  Procedure Laterality Date  . Neck surgery  2003    Cord Compression   No family history on file. Social History:  reports that he has never smoked. He does not have any smokeless tobacco history on file. He reports that  drinks alcohol. His drug history is not on file. 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 Functional Status:  Mobility: Bed Mobility Bed Mobility: Supine to Sit;Sitting - Scoot to Edge of Bed;Sit to Supine Supine to Sit: With rails;HOB flat;4: Min assist Sitting - Scoot to Edge of Bed: 4: Min assist Sit to Supine: 4: Min assist;HOB flat Transfers Transfers: Sit to Stand;Stand to Sit Sit to Stand: 4: Min assist;From bed;From toilet Stand to Sit: To bed;To toilet;4: Min guard Ambulation/Gait Ambulation/Gait Assistance: 4: Min assist Ambulation Distance (Feet): 150 Feet Assistive device: None;Other (Comment) (support to RUE to protect joint integrity) Ambulation/Gait Assistance Details: support for RUE; assist at waist with turns due to loss of balance (especially turning to his Rt); ?pt distracted in busier environment making maintaining balance more difficult Gait Pattern: Decreased stride length;Step-through pattern (RLE in external rotation) Gait velocity: significantly decr Stairs: No (pt refused due to fatigue)    ADL: ADL Eating/Feeding: Supervision/safety (eating with LUE which is non-dominant, but able to complete) Where Assessed - Eating/Feeding: Chair Grooming: Wash/dry hands;Wash/dry face;Teeth care;Moderate assistance Where Assessed - Grooming: Supported standing Upper Body Bathing: Maximal assistance Where Assessed - Upper Body Bathing: Unsupported sitting Lower Body Bathing: Moderate assistance Where Assessed - Lower Body Bathing: Supported sitting Upper Body Dressing: Maximal assistance Where Assessed - Upper Body Dressing: Unsupported sitting Lower Body Dressing: Maximal assistance Where Assessed - Lower Body Dressing: Supported sit to Pharmacist, hospital: Moderate assistance Toilet Transfer Method: Sit to stand Toilet Transfer Equipment: Comfort height  toilet;Grab bars Tub/Shower Transfer: Minimal assistance Tub/Shower Transfer Method: Science writer: Walk in shower Equipment Used: Gait belt Transfers/Ambulation Related to ADLs: Pt with left foot drop during mobilization  from bed to bathroom for ADL, Pt min assist for sit <> stand, but RUE will fall limp at his side. ADL Comments: Pt motivated to brush teeth, and clean up. Pt unable to perform BUE grooming tasks, even with max vc's. OTS faciliated moving the RUE for the Pt. Pt completed hand washing and brushing teeth with facilitated BUE. Pt washed face using LUE. Pt stood supported at sink for tasks, and reported that his RUE was not fatigued.   Cognition: Cognition Overall Cognitive Status: Difficult to assess Orientation Level: Oriented X4 Cognition Arousal/Alertness: Awake/alert Behavior During Therapy: Flat affect Overall Cognitive Status: Difficult to assess Difficult to assess due to: Impaired communication;Non-English speaking  Blood pressure 132/66, pulse 69, temperature 98.3 F (36.8 C), temperature source Oral, resp. rate 18, height 5\' 4"  (1.626 m), weight 74.844 kg (165 lb), SpO2 97.00%. Physical Exam  Vitals reviewed. Constitutional: He is oriented to person, place, and time.  HENT:  Head: Normocephalic.  Eyes: Conjunctivae and EOM are normal. Pupils are equal, round, and reactive to light.  Pupils round and reactive to light  Neck: Normal range of motion. Neck supple. No JVD present. No tracheal deviation present. No thyromegaly present.  Cardiovascular: Normal rate and regular rhythm.   Pulmonary/Chest: Effort normal and breath sounds normal. No respiratory distress.  Abdominal: Soft. Bowel sounds are normal. He exhibits no distension.  Musculoskeletal: He exhibits no edema.  Lymphadenopathy:    He has no cervical adenopathy.  Neurological: He is alert and oriented to person, place, and time.  Alert, side lying in bed. RUE is 1-2  shoulder, elbow, trace/absent at hand and wrist. RLE is 3/4 hf, ke to 3+ distally. Senses pain and light touch arm and leg. Mild right central 7, speech dysarthric.   Skin: Skin is warm and dry.  Psychiatric: He has a normal mood and affect.    Results for orders placed during the hospital encounter of 07/16/13 (from the past 24 hour(s))  BASIC METABOLIC PANEL     Status: Abnormal   Collection Time    07/18/13  6:15 AM      Result Value Range   Sodium 135  135 - 145 mEq/L   Potassium 3.8  3.5 - 5.1 mEq/L   Chloride 100  96 - 112 mEq/L   CO2 26  19 - 32 mEq/L   Glucose, Bld 144 (*) 70 - 99 mg/dL   BUN 15  6 - 23 mg/dL   Creatinine, Ser 2.13  0.50 - 1.35 mg/dL   Calcium 9.3  8.4 - 08.6 mg/dL   GFR calc non Af Amer 78 (*) >90 mL/min   GFR calc Af Amer >90  >90 mL/min   Ct Angio Neck W/cm &/or Wo/cm  07/17/2013   *RADIOLOGY REPORT*  Clinical Data:  Acute infarcts.  Syncopal episode.  Headache.  CT ANGIOGRAPHY NECK  Technique:  Multidetector CT imaging of the neck was performed using the standard protocol during bolus administration of intravenous contrast.  Multiplanar CT image reconstructions including MIPs were obtained to evaluate the vascular anatomy. Carotid stenosis measurements (when applicable) are obtained utilizing NASCET criteria, using the distal internal carotid diameter as the denominator.  Contrast: 50mL OMNIPAQUE IOHEXOL 300 MG/ML  SOLN  Comparison:  07/16/2013 MR brain and MRA angiogram  Findings:  Carotid bifurcation complex plaque bilaterally.  84% diameter stenosis proximal right internal carotid artery.  72% diameter stenosis proximal left internal carotid artery.  There may be an ulceration just beyond this left internal carotid artery  proximal stenosis.  Beyond the bifurcation, there is decreased caliber of the right internal carotid artery compared to the left.  Calcified plaque petrous and cavernous segment of the internal carotid artery bilaterally.  Plaque with mild  narrowing proximal right subclavian artery.  Minimal narrowing proximal right vertebral artery.  No significant narrowing of the left subclavian artery.  Mild narrowing proximal left vertebral artery (nondominant vertebral artery).  Heterogeneous enlarged thyroid gland with substernal extension suggestive of a goiter.  Left lobe calcification.  Right lobe with questionable 1.2 cm dominant mass.  This can be followed by thyroid ultrasound.  Calcification palatine tonsil region consistent with prior inflammation.  Minimal asymmetry glottic region with questionable fullness on the left however, no discrete mass identified.  Prior surgery of fusion C4-C7. Spinal stenosis at several levels with cord flattening incompletely assessed.  Dental disease.  Scattered increased number of normal sized lymph nodes of questionable significance.  Lung apical changes without worrisome mass noted.   Review of the MIP images confirms the above findings.  IMPRESSION: Carotid bifurcation complex plaque bilaterally.  84% diameter stenosis proximal right internal carotid artery.  72% diameter stenosis proximal left internal carotid artery.  There may be an ulceration just beyond this left internal carotid artery proximal stenosis.  Beyond the bifurcation, there is decreased caliber of the right internal carotid artery compared to the left.  Calcified plaque petrous and cavernous segment of the internal carotid artery bilaterally.  Plaque with mild narrowing proximal right subclavian artery.  Minimal narrowing proximal right vertebral artery.  No significant narrowing of the left subclavian artery.  Mild narrowing proximal left vertebral artery (nondominant vertebral artery).  Heterogeneous enlarged thyroid gland with substernal extension suggestive of a goiter.  Left lobe calcification.  Right lobe with questionable 1.2 cm dominant mass.  This can be followed by thyroid ultrasound.  Prior surgery of fusion C4-C7. Spinal stenosis at several  levels with cord flattening incompletely assessed.   Original Report Authenticated By: Lacy Duverney, M.D.   Mr [facility] Wo Contrast  07/16/2013   *RADIOLOGY REPORT*  Clinical Data:  62 year old male with right upper extremity weakness, headache, CT suggesting left MCA infarcts and small vessel disease.  Contrast: 15mL MULTIHANCE GADOBENATE DIMEGLUMINE 529 MG/ML IV SOLN  Comparison: Head CT without contrast 07/16/2013.  MRI HEAD WITHOUT AND WITH CONTRAST  Technique: Multiplanar, multiecho pulse sequences of the brain and surrounding structures were obtained according to standard protocol without and with intravenous contrast.  Findings:  Cortically based areas in the left hemisphere described on CT show confluent gyral and subcortical restricted diffusion. These are in the posterior left MCA territory, and also the lateral aspect of the left occipital lobe and occipital pole (suggesting left PCA component).  There also scattered punctate areas of restricted diffusion in the anterior superior left MCA territory. Furthermore, on the right there is confluent restricted diffusion at the caudothalamic notch corresponding to an area of well developed hypodensity on the CT.  This is accompanied by scattered punctate mostly cortically based areas of restricted diffusion in the anterior superior right MCA territory, as well as a small focal area of restricted diffusion in the genu of the corpus callosum (series 3 image 16), ACA territory.  No posterior fossa restricted diffusion. Major intracranial vascular flow voids are preserved, MRA findings are below.  No acute intracranial hemorrhage identified.  There is a chronic micro hemorrhage near the atrium of the left lateral ventricle associated with chronic lacunar infarct.  Following contrast there is  petechial ischemia related enhancement in the confluent left MCA and left occipital / PCA infarcts.  No other abnormal enhancement.  Some of the other hypodensity on the  recent comparison also are chronic small vessel disease (left MCA subcortical white matter on series 6 image 14).  Mild edema in the acutely affected areas.  No mass effect.  No ventriculomegaly.  Negative pituitary and cervicomedullary junction.  Visualized cervical spine remarkable for partially visible spinal hardware at C4.  Normal bone marrow signal. Visualized orbit soft tissues are within normal limits.  Visualized paranasal sinuses and mastoids are clear.  Negative scalp soft tissues.  IMPRESSION: 1.  Confluent posterior left MCA and left MCA / PCA watershed acute infarcts corresponding to the cortically based abnormality on the recent head CT. 2.  Addition of multiple small punctate acute infarcts in both anterior superior MCA territory is, as well as a 1-2 cm acute lacunar infarct in the right caudothalamic notch (also visible on CT), plus a  lacunar infarct in the genu of the corpus callosum (ACA territory).  These therefore suggest a recent  anterior circulation embolic shower. 3.  No mass effect or hemorrhage associated with the above. 4.  Underlying chronic small vessel ischemia. 5.  See MRA findings below.  MRA NECK WITHOUT AND WITH CONTRAST  Technique:  Angiographic images of the neck were obtained using MRA technique without and with intravenous contrast.  Carotid stenosis measurements (when applicable) are obtained utilizing NASCET criteria, using the distal internal carotid diameter as the denominator.  Findings:  Precontrast time-of-flight imaging of the neck. Thyromegaly is evident but incompletely evaluated.  Antegrade flow in both carotid and vertebral arteries in the neck to the skull base.  The right vertebral artery is dominant.  Postcontrast time-of-flight images.  Three-vessel arch configuration with no great vessel origin stenosis.  Right common carotid artery origin is normal to the carotid bifurcation.  There is then a high-grade stenosis at the right ICA origin resulting in a 5 mm  flow gap/radiographic string sign. Despite this there is distal enhancement/patency of the vessel to the skull base.  Left CCA is within normal limits.  At the left carotid bifurcation there is irregularity compatible with atherosclerotic plaque, but no hemodynamically significant left ICA origin or proximal stenosis.  Elsewhere the cervical left ICA appears within normal limits.  Dominant right vertebral artery is patent throughout the neck without stenosis.  Nondominant left vertebral artery may be mildly stenosed at its origin, but otherwise is within normal limits throughout the neck.  Intracranial findings are below.  IMPRESSION: 1.  MRA suggests a high-grade stenosis at the right ICA origin but patent right ICA to the skull base.  See intracranial findings below. 2.  Left carotid bifurcation atherosclerosis without stenosis. 3.  Dominant right vertebral artery without evidence of hemodynamically significant vertebral artery stenosis.  MRA HEAD WITHOUT CONTRAST  Technique: Angiographic images of the Circle of Willis were obtained using MRA technique without  intravenous contrast.  Findings:  Antegrade flow in the posterior circulation with dominant distal right vertebral artery.  Normal left PICA (or may be duplicated).  Dominant right AICA.  Patent vertebrobasilar junction.  Mild basilar artery fenestration proximally.  No basilar stenosis.  SCA and PCA origins are normal.  Posterior communicating arteries are diminutive or absent.  Bilateral PCA branches are irregular throughout, but remain patent.  There is bilateral distal P2 and P3 segment tandem stenoses (series 403 image 10).  Antegrade flow in both ICA siphons, the right  ICA is smaller than the left.  Also the right ICA terminus is hypoplastic owing to a dominant left ACA A1 segment.  No focal ICA siphon stenosis. Normal MCA and left ACA origins.  The anterior communicating artery may be fenestrated but otherwise is within normal limits.  The left ACA  appears to remain dominant. Visualized ACA branches are within normal limits.  Bilateral MCA M1 segments remain patent.  Both MCA bifurcations are patent without stenosis.  Mild if any irregularity of the anterior sylvian left MCA branches.  No major branch occlusion or stenosis identified.  IMPRESSION: 1.  Essentially negative anterior circulation.  Nondominant right ICA and ICA terminus.  No MCA branch occlusion or stenosis. 2.  Negative proximal posterior circulation, but moderate irregularity with tandem stenoses in the bilateral PCA branches. Bilateral preserved distal flow.   Original Report Authenticated By: Erskine Speed, M.D.   Mr Angiogram Neck W Wo Contrast  07/16/2013   *RADIOLOGY REPORT*  Clinical Data:  62 year old male with right upper extremity weakness, headache, CT suggesting left MCA infarcts and small vessel disease.  Contrast: 15mL MULTIHANCE GADOBENATE DIMEGLUMINE 529 MG/ML IV SOLN  Comparison: Head CT without contrast 07/16/2013.  MRI HEAD WITHOUT AND WITH CONTRAST  Technique: Multiplanar, multiecho pulse sequences of the brain and surrounding structures were obtained according to standard protocol without and with intravenous contrast.  Findings:  Cortically based areas in the left hemisphere described on CT show confluent gyral and subcortical restricted diffusion. These are in the posterior left MCA territory, and also the lateral aspect of the left occipital lobe and occipital pole (suggesting left PCA component).  There also scattered punctate areas of restricted diffusion in the anterior superior left MCA territory. Furthermore, on the right there is confluent restricted diffusion at the caudothalamic notch corresponding to an area of well developed hypodensity on the CT.  This is accompanied by scattered punctate mostly cortically based areas of restricted diffusion in the anterior superior right MCA territory, as well as a small focal area of restricted diffusion in the genu of the  corpus callosum (series 3 image 16), ACA territory.  No posterior fossa restricted diffusion. Major intracranial vascular flow voids are preserved, MRA findings are below.  No acute intracranial hemorrhage identified.  There is a chronic micro hemorrhage near the atrium of the left lateral ventricle associated with chronic lacunar infarct.  Following contrast there is petechial ischemia related enhancement in the confluent left MCA and left occipital / PCA infarcts.  No other abnormal enhancement.  Some of the other hypodensity on the recent comparison also are chronic small vessel disease (left MCA subcortical white matter on series 6 image 14).  Mild edema in the acutely affected areas.  No mass effect.  No ventriculomegaly.  Negative pituitary and cervicomedullary junction.  Visualized cervical spine remarkable for partially visible spinal hardware at C4.  Normal bone marrow signal. Visualized orbit soft tissues are within normal limits.  Visualized paranasal sinuses and mastoids are clear.  Negative scalp soft tissues.  IMPRESSION: 1.  Confluent posterior left MCA and left MCA / PCA watershed acute infarcts corresponding to the cortically based abnormality on the recent head CT. 2.  Addition of multiple small punctate acute infarcts in both anterior superior MCA territory is, as well as a 1-2 cm acute lacunar infarct in the right caudothalamic notch (also visible on CT), plus a  lacunar infarct in the genu of the corpus callosum (ACA territory).  These therefore suggest a recent  anterior  circulation embolic shower. 3.  No mass effect or hemorrhage associated with the above. 4.  Underlying chronic small vessel ischemia. 5.  See MRA findings below.  MRA NECK WITHOUT AND WITH CONTRAST  Technique:  Angiographic images of the neck were obtained using MRA technique without and with intravenous contrast.  Carotid stenosis measurements (when applicable) are obtained utilizing NASCET criteria, using the distal internal  carotid diameter as the denominator.  Findings:  Precontrast time-of-flight imaging of the neck. Thyromegaly is evident but incompletely evaluated.  Antegrade flow in both carotid and vertebral arteries in the neck to the skull base.  The right vertebral artery is dominant.  Postcontrast time-of-flight images.  Three-vessel arch configuration with no great vessel origin stenosis.  Right common carotid artery origin is normal to the carotid bifurcation.  There is then a high-grade stenosis at the right ICA origin resulting in a 5 mm flow gap/radiographic string sign. Despite this there is distal enhancement/patency of the vessel to the skull base.  Left CCA is within normal limits.  At the left carotid bifurcation there is irregularity compatible with atherosclerotic plaque, but no hemodynamically significant left ICA origin or proximal stenosis.  Elsewhere the cervical left ICA appears within normal limits.  Dominant right vertebral artery is patent throughout the neck without stenosis.  Nondominant left vertebral artery may be mildly stenosed at its origin, but otherwise is within normal limits throughout the neck.  Intracranial findings are below.  IMPRESSION: 1.  MRA suggests a high-grade stenosis at the right ICA origin but patent right ICA to the skull base.  See intracranial findings below. 2.  Left carotid bifurcation atherosclerosis without stenosis. 3.  Dominant right vertebral artery without evidence of hemodynamically significant vertebral artery stenosis.  MRA HEAD WITHOUT CONTRAST  Technique: Angiographic images of the Circle of Willis were obtained using MRA technique without  intravenous contrast.  Findings:  Antegrade flow in the posterior circulation with dominant distal right vertebral artery.  Normal left PICA (or may be duplicated).  Dominant right AICA.  Patent vertebrobasilar junction.  Mild basilar artery fenestration proximally.  No basilar stenosis.  SCA and PCA origins are normal.   Posterior communicating arteries are diminutive or absent.  Bilateral PCA branches are irregular throughout, but remain patent.  There is bilateral distal P2 and P3 segment tandem stenoses (series 403 image 10).  Antegrade flow in both ICA siphons, the right ICA is smaller than the left.  Also the right ICA terminus is hypoplastic owing to a dominant left ACA A1 segment.  No focal ICA siphon stenosis. Normal MCA and left ACA origins.  The anterior communicating artery may be fenestrated but otherwise is within normal limits.  The left ACA appears to remain dominant. Visualized ACA branches are within normal limits.  Bilateral MCA M1 segments remain patent.  Both MCA bifurcations are patent without stenosis.  Mild if any irregularity of the anterior sylvian left MCA branches.  No major branch occlusion or stenosis identified.  IMPRESSION: 1.  Essentially negative anterior circulation.  Nondominant right ICA and ICA terminus.  No MCA branch occlusion or stenosis. 2.  Negative proximal posterior circulation, but moderate irregularity with tandem stenoses in the bilateral PCA branches. Bilateral preserved distal flow.   Original Report Authenticated By: Erskine Speed, M.D.   Mr [facility] Wo Contrast  07/16/2013   *RADIOLOGY REPORT*  Clinical Data:  62 year old male with right upper extremity weakness, headache, CT suggesting left MCA infarcts and small vessel disease.  Contrast: 15mL  MULTIHANCE GADOBENATE DIMEGLUMINE 529 MG/ML IV SOLN  Comparison: Head CT without contrast 07/16/2013.  MRI HEAD WITHOUT AND WITH CONTRAST  Technique: Multiplanar, multiecho pulse sequences of the brain and surrounding structures were obtained according to standard protocol without and with intravenous contrast.  Findings:  Cortically based areas in the left hemisphere described on CT show confluent gyral and subcortical restricted diffusion. These are in the posterior left MCA territory, and also the lateral aspect of the left occipital lobe  and occipital pole (suggesting left PCA component).  There also scattered punctate areas of restricted diffusion in the anterior superior left MCA territory. Furthermore, on the right there is confluent restricted diffusion at the caudothalamic notch corresponding to an area of well developed hypodensity on the CT.  This is accompanied by scattered punctate mostly cortically based areas of restricted diffusion in the anterior superior right MCA territory, as well as a small focal area of restricted diffusion in the genu of the corpus callosum (series 3 image 16), ACA territory.  No posterior fossa restricted diffusion. Major intracranial vascular flow voids are preserved, MRA findings are below.  No acute intracranial hemorrhage identified.  There is a chronic micro hemorrhage near the atrium of the left lateral ventricle associated with chronic lacunar infarct.  Following contrast there is petechial ischemia related enhancement in the confluent left MCA and left occipital / PCA infarcts.  No other abnormal enhancement.  Some of the other hypodensity on the recent comparison also are chronic small vessel disease (left MCA subcortical white matter on series 6 image 14).  Mild edema in the acutely affected areas.  No mass effect.  No ventriculomegaly.  Negative pituitary and cervicomedullary junction.  Visualized cervical spine remarkable for partially visible spinal hardware at C4.  Normal bone marrow signal. Visualized orbit soft tissues are within normal limits.  Visualized paranasal sinuses and mastoids are clear.  Negative scalp soft tissues.  IMPRESSION: 1.  Confluent posterior left MCA and left MCA / PCA watershed acute infarcts corresponding to the cortically based abnormality on the recent head CT. 2.  Addition of multiple small punctate acute infarcts in both anterior superior MCA territory is, as well as a 1-2 cm acute lacunar infarct in the right caudothalamic notch (also visible on CT), plus a  lacunar  infarct in the genu of the corpus callosum (ACA territory).  These therefore suggest a recent  anterior circulation embolic shower. 3.  No mass effect or hemorrhage associated with the above. 4.  Underlying chronic small vessel ischemia. 5.  See MRA findings below.  MRA NECK WITHOUT AND WITH CONTRAST  Technique:  Angiographic images of the neck were obtained using MRA technique without and with intravenous contrast.  Carotid stenosis measurements (when applicable) are obtained utilizing NASCET criteria, using the distal internal carotid diameter as the denominator.  Findings:  Precontrast time-of-flight imaging of the neck. Thyromegaly is evident but incompletely evaluated.  Antegrade flow in both carotid and vertebral arteries in the neck to the skull base.  The right vertebral artery is dominant.  Postcontrast time-of-flight images.  Three-vessel arch configuration with no great vessel origin stenosis.  Right common carotid artery origin is normal to the carotid bifurcation.  There is then a high-grade stenosis at the right ICA origin resulting in a 5 mm flow gap/radiographic string sign. Despite this there is distal enhancement/patency of the vessel to the skull base.  Left CCA is within normal limits.  At the left carotid bifurcation there is irregularity compatible with atherosclerotic  plaque, but no hemodynamically significant left ICA origin or proximal stenosis.  Elsewhere the cervical left ICA appears within normal limits.  Dominant right vertebral artery is patent throughout the neck without stenosis.  Nondominant left vertebral artery may be mildly stenosed at its origin, but otherwise is within normal limits throughout the neck.  Intracranial findings are below.  IMPRESSION: 1.  MRA suggests a high-grade stenosis at the right ICA origin but patent right ICA to the skull base.  See intracranial findings below. 2.  Left carotid bifurcation atherosclerosis without stenosis. 3.  Dominant right vertebral  artery without evidence of hemodynamically significant vertebral artery stenosis.  MRA HEAD WITHOUT CONTRAST  Technique: Angiographic images of the Circle of Willis were obtained using MRA technique without  intravenous contrast.  Findings:  Antegrade flow in the posterior circulation with dominant distal right vertebral artery.  Normal left PICA (or may be duplicated).  Dominant right AICA.  Patent vertebrobasilar junction.  Mild basilar artery fenestration proximally.  No basilar stenosis.  SCA and PCA origins are normal.  Posterior communicating arteries are diminutive or absent.  Bilateral PCA branches are irregular throughout, but remain patent.  There is bilateral distal P2 and P3 segment tandem stenoses (series 403 image 10).  Antegrade flow in both ICA siphons, the right ICA is smaller than the left.  Also the right ICA terminus is hypoplastic owing to a dominant left ACA A1 segment.  No focal ICA siphon stenosis. Normal MCA and left ACA origins.  The anterior communicating artery may be fenestrated but otherwise is within normal limits.  The left ACA appears to remain dominant. Visualized ACA branches are within normal limits.  Bilateral MCA M1 segments remain patent.  Both MCA bifurcations are patent without stenosis.  Mild if any irregularity of the anterior sylvian left MCA branches.  No major branch occlusion or stenosis identified.  IMPRESSION: 1.  Essentially negative anterior circulation.  Nondominant right ICA and ICA terminus.  No MCA branch occlusion or stenosis. 2.  Negative proximal posterior circulation, but moderate irregularity with tandem stenoses in the bilateral PCA branches. Bilateral preserved distal flow.   Original Report Authenticated By: Erskine Speed, M.D.    Assessment/Plan: Diagnosis: watershed MCA and PCA left-sided infarcts 1. Does the need for close, 24 hr/day medical supervision in concert with the patient's rehab needs make it unreasonable for this patient to be served in  a less intensive setting? Potentially 2. Co-Morbidities requiring supervision/potential complications: htn 3. Due to bladder management, bowel management, safety, skin/wound care, disease management, medication administration, pain management and patient education, does the patient require 24 hr/day rehab nursing? Potentially 4. Does the patient require coordinated care of a physician, rehab nurse, PT (1-2 hrs/day, 5 days/week) and OT (1-2 hrs/day, 5 days/week) to address physical and functional deficits in the context of the above medical diagnosis(es)? Potentially Addressing deficits in the following areas: balance, endurance, locomotion, strength, transferring, bowel/bladder control, bathing, dressing, feeding, grooming, toileting and psychosocial support 5. Can the patient actively participate in an intensive therapy program of at least 3 hrs of therapy per day at least 5 days per week? Potentially 6. The potential for patient to make measurable gains while on inpatient rehab is good and fair 7. Anticipated functional outcomes upon discharge from inpatient rehab are supervision with PT, supervision to minimal assist with OT, n/a with SLP. 8. Estimated rehab length of stay to reach the above functional goals is: TBD 9. Does the patient have adequate social supports to accommodate  these discharge functional goals? Yes 10. Anticipated D/C setting: Home 11. Anticipated post D/C treatments: HH therapy 12. Overall Rehab/Functional Prognosis: good  RECOMMENDATIONS: This patient's condition is appropriate for continued rehabilitative care in the following setting: HH vs brief CIR admit Patient has agreed to participate in recommended program. Yes Note that insurance prior authorization may be required for reimbursement for recommended care.  Comment: Pt's needs will center mostly around self-care/RUE. Has good lower extremity strength and already walking longer dx. Family is at home to support. Rehab  RN to follow up.   Ranelle Oyster, MD, [place] [facility]     07/18/2013

## 2013-07-18 NOTE — Care Management Note (Unsigned)
    Page 1 of 1   07/18/2013     1:41:03 PM   CARE MANAGEMENT NOTE 07/18/2013  Patient:  Juan [name], Juan [name]   Account Number:  [id number]  Date Initiated:  07/17/2013  Documentation initiated by:  Jiles Crocker  Subjective/Objective Assessment:   ADMITTED WITH right upper extremity weakness and HA     Action/Plan:   LIVES AT HOME WITH SPOUSE; STROKE WORK UP IN PROGRESS; CM FOLLOWING FOR DCP   Anticipated DC Date:  07/21/2013   Anticipated DC Plan:  HOME W HOME HEALTH SERVICES      DC Planning Services  CM consult      Choice offered to / List presented to:             Status of service:  In process, will continue to follow Medicare Important Message given?  NA - LOS <3 / Initial given by admissions (If response is "NO", the following Medicare IM given date fields will be blank) Date Medicare IM given:   Date Additional Medicare IM given:    Discharge Disposition:    Per UR Regulation:  Reviewed for med. necessity/level of care/duration of stay  If discussed at Long Length of Stay Meetings, dates discussed:    Comments:  07/18/13 1150 Elmer Bales RN, MSN, CM- Spoke with Jacinta Shoe, Medical Student regarding need for CIR consult, if desired.  Per R. Allena Katz, this message will be relayed to the attending physician for potential orders.  Information was relayed to patient's RN.  Will continue to follow.   07/17/2013- B CHANDLER RN,BSN,MHA

## 2013-07-18 NOTE — Progress Notes (Addendum)
[facility] Consult  Reason for Consult:  Carotid disease s/p CVA Referring Physician:  Teaching service  [phone]  Information is obtained from family member who speaks fluent English as well as pt who speaks some [place].  He speaks [place]  History of Present Illness: This is a 62 y.o. male who has a hx of anterior cervical spine surgery on the left in 2003.  8 days ago, he woke up with a severe headache and then developed right sided weakness including arm and leg per pt and family.  The symptoms improved then returned 4 days ago and persisted. He states that the leg is much better and not having difficulty with this now but according to daughter he has not really walked since he came in the hospital.  He denies any visual changes or trouble with speech.  He states that he does not smoke.     He did have a carotid duplex scan, which revealed a right 40-59% ICA stenosis and a left 60-79% stenosis.  On the MRI, it suggests a high grade stenosis at the right ICA origin, but patent right ICA to the skull base.  The left carotid bifurcation with atherosclerosis without stenosis. CTA shows 80% right ICA, 70% left ICA.  He has been started on an aspirin and a statin since hospitalization.  He is going for a TEE later today.  [facility] is consulted for surgical management.    History reviewed. No pertinent past medical history. Past Surgical History  Procedure Laterality Date  . Neck surgery  2003    Cord Compression    No Known Allergies  Prior to Admission medications   Not on File    History   Social History  . Marital Status: Married    Spouse Name: N/A    Number of Children: N/A  . Years of Education: N/A   Occupational History  . Not on file.   Social History Main Topics  . Smoking status: Never Smoker   . Smokeless tobacco: Not on file  . Alcohol Use: Yes     Comment: a few drinks occasionally   . Drug Use: Not on file  . Sexually Active: Not on  file   Other Topics Concern  . Not on file   Social History Narrative   Lives at home with wife, son, and grandchildren.    Family Hx is reviewed and pt does not know the medical hx of his parents-only that they are deceased. ROS: [x]  Positive   [ ]  Negative   [ ]  All sytems reviewed and are negative  Cardiovascular: [x]  high blood pressure []  chest pain/pressure []  palpitations []  SOB lying flat []  DOE []  pain in legs while walking []  pain in feet when lying flat []  hx of DVT []  hx of phlebitis []  swelling in legs []  varicose veins   Pulmonary: []  productive cough []  asthma []  wheezing  Neurologic: [x]  weakness in [x]  right arms []  legs []  numbness in []  arms []  legs [] difficulty speaking or slurred speech []  temporary loss of vision in one eye []  dizziness [x]  headache  Hematologic: []  bleeding problems []  problems with blood clotting easily  Endocrine:   [x]  pre-diabetes []  thyroid disease  GI []  vomiting blood []  blood in stool  GU: []  burning with urination []  blood in urine  Psychiatric: []  hx of major depression  Integumentary: []  rashes []  ulcers  Constitutional: []  fever []  chills   Physical Examination  Filed Vitals:  07/18/13 0856  BP: 132/66  Pulse: 69  Temp: 98.3 F (36.8 C)  Resp: 18   Body mass index is 28.31 kg/(m^2).  General:  WDWN in NAD Gait: Not observed HENT: WNL, normocephalic; well healed anterior scar on left above clavicle from spine surgery in the past. Eyes: Pupils equal Pulmonary: normal non-labored breathing , without Rales, rhonchi,  wheezing Cardiac: RRR, without  Murmurs, rubs or gallops; without carotid bruits Abdomen: soft, NT, no masses Skin: without rashes, without ulcers  Vascular Exam/Pulses:+ palpable radial pulses bilaterally; he does have palpable bilateral femoral/PT pulses bilaterally.  Feet are warm and well perfused. Extremities: without ischemic changes, without Gangrene , without  cellulitis; without open wounds;  Musculoskeletal: no muscle wasting or atrophy  Neurologic: A&O X 3; Appropriate Affect ; SENSATION: normal; MOTOR FUNCTION:  RUE weakness-no grip with right hand; left hand with strong grip BLE motor is normal. Speech is fluent/normal   CBC    Component Value Date/Time   WBC 10.1 07/17/2013 0520   RBC 5.31 07/17/2013 0520   HGB 13.7 07/17/2013 0520   HCT 41.8 07/17/2013 0520   PLT 187 07/17/2013 0520   MCV 78.7 07/17/2013 0520   MCH 25.8* 07/17/2013 0520   MCHC 32.8 07/17/2013 0520   RDW 14.0 07/17/2013 0520   LYMPHSABS 2.7 07/17/2013 0520   MONOABS 0.6 07/17/2013 0520   EOSABS 1.5* 07/17/2013 0520   BASOSABS 0.1 07/17/2013 0520    BMET    Component Value Date/Time   NA 135 07/18/2013 0615   K 3.8 07/18/2013 0615   CL 100 07/18/2013 0615   CO2 26 07/18/2013 0615   GLUCOSE 144* 07/18/2013 0615   BUN 15 07/18/2013 0615   CREATININE 1.00 07/18/2013 0615   CALCIUM 9.3 07/18/2013 0615   GFRNONAA 78* 07/18/2013 0615   GFRAA >90 07/18/2013 0615     Non-Invasive Vascular Imaging:   Carotid Duplex 07/17/13:   Bilateral carotid artery duplex completed. Right: 40-59% internal carotid artery stenosis. Left: 60-79% internal carotid artery stenosis. Bilateral: Vertebral artery flow is antegrade.  MRI 07/16/13: Head: 1. Confluent posterior left MCA and left MCA / PCA watershed acute  infarcts corresponding to the cortically based abnormality on the  recent head CT.  2. Addition of multiple small punctate acute infarcts in both  anterior superior MCA territory is, as well as a 1-2 cm acute  lacunar infarct in the right caudothalamic notch (also visible on  CT), plus a lacunar infarct in the genu of the corpus callosum  (ACA territory). These therefore suggest a recent anterior  circulation embolic shower.  3. No mass effect or hemorrhage associated with the above.  4. Underlying chronic small vessel ischemia.  5. See MRA findings below.  Neck: 1. MRA suggests a  high-grade stenosis at the right ICA origin but  patent right ICA to the skull base. See intracranial findings  below.  2. Left carotid bifurcation atherosclerosis without stenosis.  3. Dominant right vertebral artery without evidence of  hemodynamically significant vertebral artery stenosis.  MRA Head without contrast: 1. Essentially negative anterior circulation. Nondominant right  ICA and ICA terminus. No MCA branch occlusion or stenosis.  2. Negative proximal posterior circulation, but moderate  irregularity with tandem stenoses in the bilateral PCA branches.  Bilateral preserved distal flow   ASSESSMENT/PLAN: This is a 62 y.o. male with bilateral carotid disease s/p CVA with RUE weakness.  -Dr. Darrick Penna will review the MRI as well as carotid duplex and formulate a plan.  Pt  will need CEA as he has symptomatic carotid artery stenosis.   Doreatha Massed, PA-C [facility] [phone]  History and exam details as above.  Exam primarily remarkable for significant right upper extremity weakness.  0/5 arm and wrist extension.  He is able to barely lift arm against gravity.   He has some decreased ability to extend and rotate neck but overall should be reasonable to perform CEA.  CTA MRI imaging reviewed.  He has had sizeable left parietoocciptal infarct and evidence of multiple prior bilateral infarcts.  Right ICA 80% left >70%. Left carotid bifurcation is slightly high just above hyoid bone right bifurcation slightly lower  Assessment: Would benefit from left CEA for stroke prophylaxis with staged right CEA for similar benefit.  Due to the size of infarct and fairly dense right upper extremity weakness would delay this for 2 weeks.  Would obtain cardiac risk stratification from cardiology as well prior to this.  Plan: Left CEA 2 weeks if no significant coronary findings and neuro status stable.  Staged right CEA several weeks after that. Continue aspirin daily including  day of operation.  Fabienne Bruns, MD [facility] [place] Office: [phone] Pager: [phone]

## 2013-07-19 NOTE — Progress Notes (Addendum)
Subjective: The patient still notes some movement in his right hand, unchanged from yesterday.  We spoke with the patient today using an in-person interpretor, and discussed at length the reason for his hospitalization, and the plan for treatment.  Patient seen by [facility], plan for CEA in 2 weeks.  Objective: Vital signs in last 24 hours: Filed Vitals:   07/19/13 0046 07/19/13 0525 07/19/13 0952 07/19/13 1400  BP: 148/73 136/70 122/76 123/71  Pulse: 71 80 75 79  Temp: 97.9 F (36.6 C) 98.3 F (36.8 C) 98.1 F (36.7 C) 98.1 F (36.7 C)  TempSrc: Oral Oral Oral Oral  Resp:   18 16  Height:      Weight:      SpO2: 97% 98% 97% 97%   Weight change:  No intake or output data in the 24 hours ending 07/19/13 1718 General: lying in bed, no acute distress HEENT: pupils equal round and reactive to light, vision grossly intact, oropharynx clear and non-erythematous  Neck: supple, no lymphadenopathy Lungs: clear to ascultation bilaterally, normal work of respiration, no wheezes, rales, ronchi Heart: regular rate and rhythm, no murmurs, gallops, or rubs Abdomen: soft, non-tender, non-distended, normal bowel sounds  Extremities: no cyanosis, clubbing, or edema Neurologic: alert & oriented X3, cranial nerves II-XII grossly intact with mild right facial droop, strength 3/5 in right upper extremity, otherwise 5/5, sensation intact to light touch  Lab Results: Basic Metabolic Panel:  Recent Labs Lab 07/17/13 0520 07/18/13 0615  NA 135 135  K 3.8 3.8  CL 98 100  CO2 27 26  GLUCOSE 145* 144*  BUN 15 15  CREATININE 1.03 1.00  CALCIUM 9.0 9.3   Liver Function Tests:  Recent Labs Lab 07/16/13 1019  AST 23  ALT 31  ALKPHOS 76  BILITOT 0.2*  PROT 7.7  ALBUMIN 3.3*   CBC:  Recent Labs Lab 07/16/13 1019 07/16/13 1035 07/17/13 0520  WBC 11.3*  --  10.1  NEUTROABS 6.4  --  5.2  HGB 14.6 16.0 13.7  HCT 42.8 47.0 41.8  MCV 78.2  --  78.7  PLT 203  --  187   Cardiac  Enzymes:  Recent Labs Lab 07/16/13 1020  TROPONINI <0.30   Fasting Lipid Panel:  Recent Labs Lab 07/17/13 0520  CHOL 254*  HDL 28*  LDLCALC UNABLE TO CALCULATE IF TRIGLYCERIDE OVER 400 mg/dL  TRIG 1610*  CHOLHDL 9.1   Coagulation:  Recent Labs Lab 07/16/13 1019  LABPROT 12.2  INR 0.92   Urine Drug Screen: Drugs of Abuse     Component Value Date/Time   LABOPIA NONE DETECTED 07/17/2013 0014   COCAINSCRNUR NONE DETECTED 07/17/2013 0014   LABBENZ NONE DETECTED 07/17/2013 0014   AMPHETMU NONE DETECTED 07/17/2013 0014   THCU NONE DETECTED 07/17/2013 0014   LABBARB NONE DETECTED 07/17/2013 0014    Alcohol Level:  Recent Labs Lab 07/16/13 1019  ETH <11   Urinalysis:  Recent Labs Lab 07/17/13 0014  COLORURINE YELLOW  LABSPEC 1.014  PHURINE 6.5  GLUCOSEU NEGATIVE  HGBUR NEGATIVE  BILIRUBINUR NEGATIVE  KETONESUR NEGATIVE  PROTEINUR 100*  UROBILINOGEN 0.2  NITRITE NEGATIVE  LEUKOCYTESUR NEGATIVE    Micro Results: No results found for this or any previous visit (from the past 240 hour(s)). Studies/Results: No results found. Medications: I have reviewed the patient's current medications. Scheduled Meds: . aspirin EC  81 mg Oral Daily  . atorvastatin  80 mg Oral q1800  . enoxaparin (LOVENOX) injection  40 mg Subcutaneous Q24H  Continuous Infusions: . sodium chloride 125 mL/hr at 07/16/13 2156  . sodium chloride     PRN Meds:.senna-docusate Assessment/Plan: The patient is a 62 yo M, no known PMH, presenting with CVA.   # Subacute CVA - symptoms started 6 days PTA, but worsened on the day of admission. Symptoms include right upper and lower extremity weakness, and possible word-finding difficulty, though symptoms are starting to improve. CT/MRI shows shows acute/subacute L MCA distribution infarcts, with some right-sided infarcts as well. CTA neck shows bilateral ICA stenosis  -Neuro consulted, appreciate recs  -plan for TEE in AM  -continue aspirin,  atorvastatin  -PT/OT consults, will consult CIR today  -consulted vascular surgery today for consideration of CEA   # Carotid Stenosis - R>L, though symptomatic on L -plan for CEA in 2 weeks  Dispo: Plan for discharge tomorrow  .Services Needed at time of discharge: Y = Yes, Blank = No PT:   OT:   RN:   Equipment:   Other:     LOS: 3 days   Linward Headland, MD 07/19/2013, 5:18 PM

## 2013-07-19 NOTE — Progress Notes (Signed)
Subjective: Wife at bedside this AM. Sitting up and had just finished breakfast. Denies any pain or acute complaints.   Spoke to patient and his wife with interpretor today and explained to him his disease, vascular findings, and lifestyle factors to manage his disease.  Depression: Spoke to daughter yesterday at bedside who does not feel her father has any symptoms consistent with DSM-V criteria for depression. Spends most of the day out of bed working on cars and outdoors with grandchildren prior to recent events. Offered option of treatment if she felt concerned given that it might take time for him to recover function.   TEE: not done yesterday due to scheduling error and has been rescheduled for Thursday @ 11am per [facility].   Vascular surgery: recommended intervention for left CEA two weeks out if neurologically stabled followed by right CEA several weeks thereafter. Per ACC/AHA guidelines, carotid endarterectomy is an intermediate risk procedure. Patient's risk factors include recent CVA, grade I diastolic dysfunction (EF 50-55% per echo 7/21), minor LVH per EKG (7/21). In absence of symptoms of CHF, patient only meets one risk factor of revised Goldman cardiac risk index placing him at a 1% post-operative risk for cardiac death, non-fatal MI, and non-fatal cardiac arrest.   Inpatient rehab: Per PT's note, will reconsider for inpatient rehab tomorrow following TEE.  Objective: Vital signs in last 24 hours: Filed Vitals:   07/18/13 1826 07/18/13 2200 07/19/13 0046 07/19/13 0525  BP: 134/72 146/75 148/73 136/70  Pulse: 78 76 71 80  Temp: 98.4 F (36.9 C) 98.2 F (36.8 C) 97.9 F (36.6 C) 98.3 F (36.8 C)  TempSrc: Oral Oral Oral Oral  Resp: 18 18    Height:      Weight:      SpO2: 97% 98% 97% 98%   Physical Exam General: alert, cooperative, lying in bed HEENT: pupils equal round and reactive to light, vision grossly intact, oropharynx clear and non-erythematous    Heart: regular rate and rhythm, no murmurs, gallops, or rubs Neurologic: alert & oriented X3 grossly, facial droop (L>R) stable from yesterday, cranial nerves otherwise intact, RUE 2/5, RLE 5/5, LUE & LLE 5/5 strength, sensation intact to light touch  Lab Results: BMET    Component Value Date/Time   NA 135 07/18/2013 0615   K 3.8 07/18/2013 0615   CL 100 07/18/2013 0615   CO2 26 07/18/2013 0615   GLUCOSE 144* 07/18/2013 0615   BUN 15 07/18/2013 0615   CREATININE 1.00 07/18/2013 0615   CALCIUM 9.3 07/18/2013 0615   GFRNONAA 78* 07/18/2013 0615   GFRAA >90 07/18/2013 0615   Medications: I have reviewed the patient's current medications. Scheduled Meds: . aspirin EC  81 mg Oral Daily  . atorvastatin  80 mg Oral q1800  . enoxaparin (LOVENOX) injection  40 mg Subcutaneous Q24H   Continuous Infusions: . sodium chloride 125 mL/hr at 07/16/13 2156  . sodium chloride     PRN Meds:.senna-docusate  Assessment/Plan: 62-year old [place] male recently found to have pre-diabetes, HTN, and dyslipidemia admitted for right-sided arm and leg weakness 2/2 acute ischemic stroke with possible depression and thyroid nodule.  #Acute ischemic stroke: Bilateral stroke likely 2/2 atherosclerotic changes at the carotid bifurcation (R > L) and proximal right subclavian/vertebral artery.   -Assessed to be at low pre-op risk for vascular surgery -TEE scheduled for tomorrow so will be NPO after midnight -Continue ASA -Continue neuro checks q4h -Appreciate neuro recs  #Pre-diabetes: A1c 6.4. Will address lifestyle factors and need   to establish care with PCP.  #HTN: BP 140+/90+ at office visit last week though trending lower now. Will continue watching and treat accordingly.   #Dyslipidemia: Continue atorvastatin. Will need to recheck lipids at f/u outpatient visit.       #Depression: Continues to appear improved but still unable to assess patient due to language barrier.  -Will continue assessing.    #Thyroid nodule: Recommend TSH check at outpatient f/u  #Disposition: Pending workup but possible in 1-2 days.   This is a Medical Student Note.  The care of the patient was discussed with Dr. Lawrence Klima and the assessment and plan formulated with their assistance.  Please see their attached note for official documentation of the daily encounter.   LOS: 3 days  Rushil V Malijah Lietz, Med Student 07/19/2013, 6:43 AM 

## 2013-07-19 NOTE — Progress Notes (Signed)
Rehab admissions - Evaluated for possible admission.  I requested and obtained an interpreter at 11:45 am today.  I spoke with patient and his wife with the interpreter.  I would like to see how patient does with OT today.  TEE is pending and cannot be completed until tomorrow.  Patient has no insurance.  Noted PT recommending HH.  Will decide on venue of care needed tomorrow after TEE is completed.  Call me for questions.  #[phone]

## 2013-07-19 NOTE — Progress Notes (Signed)
Stroke Team Progress Note  HISTORY Juan [name] is a 62 y.o. male with a past medical history significant for cervical spine surgery who was in his usual state of health until 6 days ago when he awoke with a severe, pounding HA and collapsed to the floor without impairment of consciousness. Then he developed weakness of the right upper extremity and he has been unable to use the right arm ever since. Never had similar symptoms before Denies nausea, vomiting, double vision, slurred speech, language or vision impairment but still experiencing HA.  No recent fever, infection, or head-neck trauma. CT brain revealed possible subacute left MCA distribution infarct. History reviewed. No pertinent past medical history.Patient was not a TPA candidate secondary to delay in arrival. He was admitted for further evaluation and treatment.  SUBJECTIVE Family at bedside. Rehab seeing pt as well.  OBJECTIVE Most recent Vital Signs: Filed Vitals:   07/18/13 1826 07/18/13 2200 07/19/13 0046 07/19/13 0525  BP: 134/72 146/75 148/73 136/70  Pulse: 78 76 71 80  Temp: 98.4 F (36.9 C) 98.2 F (36.8 C) 97.9 F (36.6 C) 98.3 F (36.8 C)  TempSrc: Oral Oral Oral Oral  Resp: 18 18    Height:      Weight:      SpO2: 97% 98% 97% 98%   CBG (last 3)  No results found for this basename: GLUCAP,  in the last 72 hours  IV Fluid Intake:   . sodium chloride 125 mL/hr at 07/16/13 2156  . sodium chloride      MEDICATIONS  . aspirin EC  81 mg Oral Daily  . atorvastatin  80 mg Oral q1800  . enoxaparin (LOVENOX) injection  40 mg Subcutaneous Q24H   PRN:  senna-docusate  Diet:  General thin liquids Activity:  OOB with assistance DVT Prophylaxis:  Lovenox 40 mg sq daily  CLINICALLY SIGNIFICANT STUDIES Basic Metabolic Panel:   Recent Labs Lab 07/17/13 0520 07/18/13 0615  NA 135 135  K 3.8 3.8  CL 98 100  CO2 27 26  GLUCOSE 145* 144*  BUN 15 15  CREATININE 1.03 1.00  CALCIUM 9.0 9.3   Liver Function Tests:    Recent Labs Lab 07/16/13 1019  AST 23  ALT 31  ALKPHOS 76  BILITOT 0.2*  PROT 7.7  ALBUMIN 3.3*   CBC:   Recent Labs Lab 07/16/13 1019 07/16/13 1035 07/17/13 0520  WBC 11.3*  --  10.1  NEUTROABS 6.4  --  5.2  HGB 14.6 16.0 13.7  HCT 42.8 47.0 41.8  MCV 78.2  --  78.7  PLT 203  --  187   Coagulation:   Recent Labs Lab 07/16/13 1019  LABPROT 12.2  INR 0.92   Cardiac Enzymes:   Recent Labs Lab 07/16/13 1020  TROPONINI <0.30   Urinalysis:   Recent Labs Lab 07/17/13 0014  COLORURINE YELLOW  LABSPEC 1.014  PHURINE 6.5  GLUCOSEU NEGATIVE  HGBUR NEGATIVE  BILIRUBINUR NEGATIVE  KETONESUR NEGATIVE  PROTEINUR 100*  UROBILINOGEN 0.2  NITRITE NEGATIVE  LEUKOCYTESUR NEGATIVE   Lipid Panel    Component Value Date/Time   CHOL 254* 07/17/2013 0520   TRIG 1006* 07/17/2013 0520   HDL 28* 07/17/2013 0520   CHOLHDL 9.1 07/17/2013 0520   VLDL UNABLE TO CALCULATE IF TRIGLYCERIDE OVER 400 mg/dL 1/61/0960 4540   LDLCALC UNABLE TO CALCULATE IF TRIGLYCERIDE OVER 400 mg/dL 9/81/1914 7829   FAOZ3Y  Lab Results  Component Value Date   HGBA1C 6.4 07/11/2013  Urine Drug Screen:     Component Value Date/Time   LABOPIA NONE DETECTED 07/17/2013 0014   COCAINSCRNUR NONE DETECTED 07/17/2013 0014   LABBENZ NONE DETECTED 07/17/2013 0014   AMPHETMU NONE DETECTED 07/17/2013 0014   THCU NONE DETECTED 07/17/2013 0014   LABBARB NONE DETECTED 07/17/2013 0014    Alcohol Level:   Recent Labs Lab 07/16/13 1019  ETH <11    CT of the brain  07/16/2013   Possible subacute left MCA distribution infarcts, as described above.  Additional multifocal chronic infarcts, small vessel ischemic changes, and intracranial atherosclerosis.    MRI of the brain  07/16/2013    1.  Confluent posterior left MCA and left MCA / PCA watershed acute infarcts corresponding to the cortically based abnormality on the recent head CT. 2.  Addition of multiple small punctate acute infarcts in both anterior  superior MCA territory is, as well as a 1-2 cm acute lacunar infarct in the right caudothalamic notch (also visible on CT), plus a  lacunar infarct in the genu of the corpus callosum (ACA territory).  These therefore suggest a recent  anterior circulation embolic shower. 3.  No mass effect or hemorrhage associated with the above. 4.  Underlying chronic small vessel ischemia.   MRA of the brain  07/16/2013  1.  Essentially negative anterior circulation.  Nondominant right ICA and ICA terminus.  No MCA branch occlusion or stenosis. 2.  Negative proximal posterior circulation, but moderate irregularity with tandem stenoses in the bilateral PCA branches. Bilateral preserved distal flow.   MRA of the neck  07/16/2013 1.  MRA suggests a high-grade stenosis at the right ICA origin but patent right ICA to the skull base.  See intracranial findings below. 2.  Left carotid bifurcation atherosclerosis without stenosis. 3.  Dominant right vertebral artery without evidence of hemodynamically significant vertebral artery stenosis.    CT angiogram of the neck 07/17/2013. Right internal carotid artery 84% stenosis. Left internal carotid artery 72% stenosis.   2D Echocardiogram  EF 50-55% with no source of embolus.   Carotid Doppler  Right: 40-59% internal carotid artery stenosis. Left: 60-79% internal carotid artery stenosis. Bilateral: Vertebral artery flow is antegrade.  CXR    EKG  normal sinus rhythm, PAC's noted.   Therapy Recommendations CIR  Physical Exam   Middle aged [place] male not in distress.Awake alert. Afebrile. Head is nontraumatic. Neck is supple without bruit. Hearing is normal. Cardiac exam no murmur or gallop. Lungs are clear to auscultation. Distal pulses are well felt. Neurological Exam ; Awake alert speaks limited English and interpreter not avialable hence exam limited.speech not dysarthric. Follows commands well. Eye movements full. Mild right lower face weakness. RUE mild drift.  Weakness right grip and intrinsic hand muscles. Mild right hip flexor and ankle dorsiflexor weakness. Decreased coordination on right. Gait deferred.    ASSESSMENT Mr. Juan [name] is a 62 y.o. male presenting with headache, RUE monoparesis. Imaging confirms a multiple bilateral cortical and subcortical infarcts - left in a watershed distribution. Infarcts felt to be artery to artery emboli secondary to bilateral symptomatic carotid stenosis. On no antithrombotics prior to admission. Now on aspirin 81 mg orally every day for secondary stroke prevention. Patient with resultant right hemiparesis.    Bilateral symptomatic ICA stenosis - Right internal carotid artery 84% stenosis. Left internal carotid artery 72% stenosis. Priority to treat/intervene on L carotid prior to R.  Possible 1.2 cm right thyroid mass by CT angiogram.  Hyperlipidemia, LDL unable to  calculate; on no statin PTA, now on lipitor 80, goal LDL < 100  Hypertension Prediabetes - hemoglobin A1c 6.4  Hospital day # 3  TREATMENT/PLAN RECOMMENDATIONS  Continue aspirin 81 mg orally every day for secondary stroke prevention. Medically stable for inpatient rehabilitation at time of discharge - can do CEA at end of rehab, or he may be discharged home prior to surgery Vascular surgeon, Dr. Darrick Penna, to do left CEA in 2 weeks and Rt CEA later in 6 weeks. Ongoing risk factor modification TEE not indicated in setting of bilateral symptomatic ICA stenosis. Would cancel.  No further stroke workup indicated. Patient has a 10-15% risk of having another stroke over the next year, the highest risk is within 2 weeks of the most recent stroke/TIA. Surgery will carry it's own risk. Ongoing risk factor control by Primary Care Physician Stroke Service will sign off. Please call should any needs arise. Follow up with Dr. Pearlean Brownie, Stroke Clinic, in 2 months.   Annie Main, MSN, RN, ANVP-BC, ANP-BC, [facility] Pager:  [phone] 07/19/2013 9:34 AM  I have personally obtained a history, examined the patient, evaluated imaging results, and formulated the assessment and plan of care. I agree with the above. Delia Heady, MD

## 2013-07-19 NOTE — Progress Notes (Signed)
I agree with the following treatment note after reviewing documentation.   Johnston, Rosaria Kubin Brynn   OTR/L Pager: [phone] Office: [phone] .   

## 2013-07-19 NOTE — Progress Notes (Signed)
Internal Medicine Attending Progress Note Date: 07/19/2013  Patient name: Juan [name] Medical record number: [id number] Date of birth: 10/20/1951 Age: 62 Juan.o. Gender: male  I saw and evaluated the patient. I reviewed the student's note and I agree with the student's findings and plan as documented in the student's note.  S: Mr. Rials is w/o complaints today.  Physical Exam - key components related to admission:  Filed Vitals:   07/19/13 0525 07/19/13 0952 07/19/13 1400 07/19/13 1822  BP: 136/70 122/76 123/71 128/76  Pulse: 80 75 79 76  Temp: 98.3 F (36.8 C) 98.1 F (36.7 C) 98.1 F (36.7 C) 98.1 F (36.7 C)  TempSrc: Oral Oral Oral Oral  Resp:  18 16 16  Height:      Weight:      SpO2: 98% 97% 97% 100%   Gen: WDWN man sitting comfortably in bed in NAD. Neuro: RUE: 3/5 strength unchanged from yesterday.  Lab results: None  Assessment & Plan by Problem:  1) Anterior circulation embolic shower with B CVA: Mr. Nipp has been stable clinically but does have residual weakness in his RUE.  Will continue the ASA for stroke prophylaxis.  B CEA will be performed in the coming weeks.  Awaiting TEE to assess source of anterior circulation embolic shower as I am uncomfortable attributing it solely to near simultaneous bilateral internal carotid sources.  2) Disposition: D/C home tomorrow after the TEE with home health PT. 

## 2013-07-19 NOTE — Progress Notes (Signed)
Occupational Therapy Treatment Patient Details Name: Juan [name] MRN: [id number] DOB: [birth year] Today's Date: 07/19/2013 Time: 4098-1191 OT Time Calculation (min): 32 min  OT Assessment / Plan / Recommendation  History of present illness 62 yo man presenting with weakness.  6 days PTA he had abrupt onset of right arm weakness, headache, and word-finding difficulty. Symptoms persisted unchanged until the morning of admission, when he experienced abrupt onset of right leg weakness as well. 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 These therefore suggest a recent anterior circulation embolic shower.    Clinical Impression Pt still with significant RUE hemiparesis, and mild inattention. Pt needed verbal cues to engage RUE in ADL, and guard it during functional transfers and ambulation. Pt was given, and reviewed/practiced a HEP for the RUE. After discussing with family (translator present) OTS and OT in favor of Pt d/c home with HHOT to incr independence in ADL and function of RUE.      Follow Up Recommendations  Home health OT       Equipment Recommendations  None recommended by OT       Frequency Min 3X/week   Progress towards OT Goals Progress towards OT goals: Progressing toward goals  Plan Discharge plan needs to be updated    Precautions / Restrictions Precautions Precautions: Fall Precaution Comments: R hemiperesis Restrictions Weight Bearing Restrictions: No   Pertinent Vitals/Pain Pt reported no pain during session.    ADL  Grooming: Wash/dry hands;Wash/dry face;Moderate assistance Where Assessed - Grooming: Unsupported standing Upper Body Bathing: Right arm;Left arm;Moderate  assistance Where Assessed - Upper Body Bathing: Unsupported standing Transfers/Ambulation Related to ADLs: Pt with left foot drop during mobilization from bed to bathroom for ADL, Pt supervision for sit <> stand, but RUE will fall limp at his side. ADL Comments: Pt able to engage RUE minimally in grooming tasks at sink level with vc's to incorporate RUE. During washing, Pt with inattention, and RUE not able to wash left side of body. Session focused on HEP for RUE. Pt needed total facilitation for all movements (open and closing fist, spreading fingers, lifting fingers off table, thumb circles) Family and Pt educated. HEP is in [place] and son that will be home with him said that he would be able to translate.     OT Goals(current goals can now be found in the care plan section) Acute Rehab OT Goals Patient Stated Goal: To get the use of his RUE back OT Goal Formulation: With patient/family Time For Goal Achievement: 07/31/13 Potential to Achieve Goals: Good ADL Goals Pt Will Perform Grooming: with modified independence;standing Pt Will Perform Upper Body Dressing: with supervision;sitting Pt Will Perform Lower Body Dressing: with min guard assist;sit to/from stand Pt Will Perform Toileting - Clothing Manipulation and hygiene: with supervision;sit to/from stand  Visit Information  Last OT Received On: 07/19/13 Assistance Needed: +1 History of Present Illness: 62 yo man presenting with weakness.  6 days PTA he had abrupt onset of right arm weakness, headache, and word-finding difficulty. Symptoms persisted unchanged until the morning of admission, when he experienced abrupt onset of right leg weakness as well. 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 MRI (+) posterior left MCA and left PCA watershed  acute infarct. Addition of multiple small punctate acute infarcts in both anterior superior MCA territories, a 1-2 cm acute lacunar infarct in the right caudothalamic notch, plus a lacunar  infarct in the genu of the corpus callosum (ACA territory). These therefore suggest a recent anterior circulation embolic shower.           Cognition  Cognition Arousal/Alertness: Awake/alert Behavior During Therapy: Flat affect Overall Cognitive Status: Difficult to assess Difficult to assess due to: Impaired communication;Non-English speaking    Mobility  Bed Mobility Bed Mobility: Supine to Sit;Sitting - Scoot to Edge of Bed;Sit to Supine Supine to Sit: With rails;HOB flat;5: Supervision Sitting - Scoot to Edge of Bed: 5: Supervision Sit to Supine: HOB flat;5: Supervision Details for Bed Mobility Assistance: pt required incr time with all movements; incr effort; leaving RUE behind him as he came out of Lt side of bed Transfers Transfers: Sit to Stand;Stand to Sit Sit to Stand: From bed;5: Supervision Stand to Sit: 5: Supervision;To bed Details for Transfer Assistance: pt with no sway or LOB with transfers x 2          End of Session OT - End of Session Equipment Utilized During Treatment: Gait belt Activity Tolerance: Patient tolerated treatment well Patient left: in bed;with call bell/phone within reach;with family/visitor present Nurse Communication: Mobility status;Precautions  GO     Sherryl Manges 07/19/2013, 3:28 PM

## 2013-07-19 NOTE — Progress Notes (Signed)
Pt resting quietly in bed, denies any complaints, family and "interrpetor at bedside.

## 2013-07-20 ENCOUNTER — Telehealth: Payer: Self-pay | Admitting: Vascular Surgery

## 2013-07-20 ENCOUNTER — Encounter (HOSPITAL_COMMUNITY): Payer: Self-pay | Admitting: *Deleted

## 2013-07-20 ENCOUNTER — Encounter (HOSPITAL_COMMUNITY)
Admission: EM | Disposition: A | Discharge status: Home / Self Care | Payer: Self-pay | Source: Home / Self Care | Attending: Internal Medicine

## 2013-07-20 ENCOUNTER — Other Ambulatory Visit: Payer: Self-pay | Admitting: *Deleted

## 2013-07-20 DIAGNOSIS — I63239 Cerebral infarction due to unspecified occlusion or stenosis of unspecified carotid arteries: Secondary | ICD-10-CM

## 2013-07-20 DIAGNOSIS — Z01810 Encounter for preprocedural cardiovascular examination: Secondary | ICD-10-CM

## 2013-07-20 DIAGNOSIS — I739 Peripheral vascular disease, unspecified: Secondary | ICD-10-CM

## 2013-07-20 HISTORY — PX: TEE WITHOUT CARDIOVERSION: SHX5443

## 2013-07-20 SURGERY — ECHOCARDIOGRAM, TRANSESOPHAGEAL
Anesthesia: Moderate Sedation

## 2013-07-20 MED ORDER — MIDAZOLAM HCL 5 MG/ML IJ SOLN
INTRAMUSCULAR | Status: AC
  Filled 2013-07-20: qty 2

## 2013-07-20 MED ORDER — BUTAMBEN-TETRACAINE-BENZOCAINE 2-2-14 % EX AERO
INHALATION_SPRAY | CUTANEOUS | Status: DC | PRN
  Administered 2013-07-20: 2 via TOPICAL

## 2013-07-20 MED ORDER — FENTANYL CITRATE 0.05 MG/ML IJ SOLN
INTRAMUSCULAR | Status: AC
  Filled 2013-07-20: qty 2

## 2013-07-20 MED ORDER — MIDAZOLAM HCL 10 MG/2ML IJ SOLN
INTRAMUSCULAR | Status: DC | PRN
  Administered 2013-07-20: 2 mg via INTRAVENOUS
  Administered 2013-07-20: 1 mg via INTRAVENOUS

## 2013-07-20 MED ORDER — ATORVASTATIN CALCIUM 80 MG PO TABS: 80.0000 mg | ORAL_TABLET | Freq: Every day | ORAL | Status: DC

## 2013-07-20 MED ORDER — FENTANYL CITRATE 0.05 MG/ML IJ SOLN
INTRAMUSCULAR | Status: DC | PRN
  Administered 2013-07-20: 50 ug via INTRAVENOUS

## 2013-07-20 MED ORDER — ASPIRIN 81 MG PO TBEC: 81.0000 mg | DELAYED_RELEASE_TABLET | Freq: Every day | ORAL | Status: DC

## 2013-07-20 NOTE — H&P (View-Only) (Signed)
Internal Medicine Attending Progress Note Date: 07/19/2013  Patient name: Juan [name] Medical record number: [id number] Date of birth: October 24, 1951 Age: 62 y.o. Gender: male  I saw and evaluated the patient. I reviewed the student's note and I agree with the student's findings and plan as documented in the student's note.  S: Juan [name] is w/o complaints today.  Physical Exam - key components related to admission:  Filed Vitals:   07/19/13 0525 07/19/13 0952 07/19/13 1400 07/19/13 1822  BP: 136/70 122/76 123/71 128/76  Pulse: 80 75 79 76  Temp: 98.3 F (36.8 C) 98.1 F (36.7 C) 98.1 F (36.7 C) 98.1 F (36.7 C)  TempSrc: Oral Oral Oral Oral  Resp:  18 16 16   Height:      Weight:      SpO2: 98% 97% 97% 100%   Gen: WDWN man sitting comfortably in bed in NAD. Neuro: RUE: 3/5 strength unchanged from yesterday.  Lab results: None  Assessment & Plan by Problem:  1) Anterior circulation embolic shower with B CVA: Juan [name] has been stable clinically but does have residual weakness in his RUE.  Will continue the ASA for stroke prophylaxis.  B CEA will be performed in the coming weeks.  Awaiting TEE to assess source of anterior circulation embolic shower as I am uncomfortable attributing it solely to near simultaneous bilateral internal carotid sources.  2) Disposition: D/C home tomorrow after the TEE with home health PT.

## 2013-07-20 NOTE — Progress Notes (Signed)
OT Cancellation Note  Patient Details Name: COLLIN RENGEL MRN: [id number] DOB: 1951/09/05   Cancelled Treatment:    Reason Eval/Treat Not Completed: Patient at procedure or test/ unavailable (at ENDO)  Lucile Shutters 07/20/2013, 10:21 AM

## 2013-07-20 NOTE — Progress Notes (Signed)
PT Cancellation Note  Patient Details Name: Juan [name] MRN: [id number] DOB: 06-May-1951   Cancelled Treatment:    Reason Eval/Treat Not Completed: Patient at procedure or test/unavailable   Usher Hedberg 07/20/2013, 10:42 AM Pager [phone]

## 2013-07-20 NOTE — Progress Notes (Signed)
Rehab admissions - PT and OT now recommending home with [facility] therapies.  Patient has a lot of home support.  Agree with home with [facility] when medically ready for discharge.  Call me for questions.  #[phone]

## 2013-07-20 NOTE — Progress Notes (Signed)
*  PRELIMINARY RESULTS* Echocardiogram Echocardiogram Transesophageal has been performed.  Jeryl Columbia 07/20/2013, 11:41 AM

## 2013-07-20 NOTE — CV Procedure (Signed)
TEE:  3 mg versed 50 ug fentanyl  Normal EF 55% Normal atria No LAA thrombus Normal AV, TV and PV Mild MR No ASD negative bubble study Normal aorta  No source of embolus  Charlton Haws

## 2013-07-20 NOTE — Progress Notes (Signed)
Subjective: No acute events overnight.  TEE today showed no PFO or thrombus.  Patient excited to be going home today.  Objective: Vital signs in last 24 hours: Filed Vitals:   07/20/13 1125 07/20/13 1132 07/20/13 1140 07/20/13 1150  BP: 158/83 138/66 115/63 107/68  Pulse:  60 60   Temp:  97.9 F (36.6 C)    TempSrc:  Oral    Resp: 15 10 12 13   Height:      Weight:      SpO2: 98% 99% 98% 98%   Weight change:   Intake/Output Summary (Last 24 hours) at 07/20/13 1234 Last data filed at 07/20/13 1100  Gross per 24 hour  Intake     50 ml  Output      0 ml  Net     50 ml  General: lying in bed, no acute distress HEENT: pupils equal round and reactive to light, vision grossly intact, oropharynx clear and non-erythematous  Neck: supple, no lymphadenopathy Lungs: clear to ascultation bilaterally, normal work of respiration, no wheezes, rales, ronchi Heart: regular rate and rhythm, no murmurs, gallops, or rubs Abdomen: soft, non-tender, non-distended, normal bowel sounds  Extremities: no cyanosis, clubbing, or edema Neurologic: alert & oriented X3, cranial nerves II-XII grossly intact with mild right facial droop, strength 3/5 in right upper extremity, otherwise 5/5, sensation intact to light touch  Lab Results: Basic Metabolic Panel:  Recent Labs Lab 07/17/13 0520 07/18/13 0615  NA 135 135  K 3.8 3.8  CL 98 100  CO2 27 26  GLUCOSE 145* 144*  BUN 15 15  CREATININE 1.03 1.00  CALCIUM 9.0 9.3   Liver Function Tests:  Recent Labs Lab 07/16/13 1019  AST 23  ALT 31  ALKPHOS 76  BILITOT 0.2*  PROT 7.7  ALBUMIN 3.3*   CBC:  Recent Labs Lab 07/16/13 1019 07/16/13 1035 07/17/13 0520  WBC 11.3*  --  10.1  NEUTROABS 6.4  --  5.2  HGB 14.6 16.0 13.7  HCT 42.8 47.0 41.8  MCV 78.2  --  78.7  PLT 203  --  187   Cardiac Enzymes:  Recent Labs Lab 07/16/13 1020  TROPONINI <0.30   Fasting Lipid Panel:  Recent Labs Lab 07/17/13 0520  CHOL 254*  HDL 28*    LDLCALC UNABLE TO CALCULATE IF TRIGLYCERIDE OVER 400 mg/dL  TRIG 8295*  CHOLHDL 9.1   Coagulation:  Recent Labs Lab 07/16/13 1019  LABPROT 12.2  INR 0.92   Urine Drug Screen: Drugs of Abuse     Component Value Date/Time   LABOPIA NONE DETECTED 07/17/2013 0014   COCAINSCRNUR NONE DETECTED 07/17/2013 0014   LABBENZ NONE DETECTED 07/17/2013 0014   AMPHETMU NONE DETECTED 07/17/2013 0014   THCU NONE DETECTED 07/17/2013 0014   LABBARB NONE DETECTED 07/17/2013 0014    Alcohol Level:  Recent Labs Lab 07/16/13 1019  ETH <11   Urinalysis:  Recent Labs Lab 07/17/13 0014  COLORURINE YELLOW  LABSPEC 1.014  PHURINE 6.5  GLUCOSEU NEGATIVE  HGBUR NEGATIVE  BILIRUBINUR NEGATIVE  KETONESUR NEGATIVE  PROTEINUR 100*  UROBILINOGEN 0.2  NITRITE NEGATIVE  LEUKOCYTESUR NEGATIVE    Micro Results: No results found for this or any previous visit (from the past 240 hour(s)). Studies/Results: No results found. Medications: I have reviewed the patient's current medications. Scheduled Meds: . aspirin EC  81 mg Oral Daily  . atorvastatin  80 mg Oral q1800  . enoxaparin (LOVENOX) injection  40 mg Subcutaneous Q24H   Continuous  Infusions: . sodium chloride 125 mL/hr at 07/16/13 2156   PRN Meds:.senna-docusate Assessment/Plan: The patient is a 62 yo M, no known PMH, presenting with CVA.   # Subacute CVA - symptoms started 6 days PTA, but worsened on the day of admission. Symptoms include right upper and lower extremity weakness, and possible word-finding difficulty, though symptoms are starting to improve. CT/MRI shows shows acute/subacute L MCA distribution infarcts, with some right-sided infarcts as well. CTA neck shows bilateral ICA stenosis  -Neuro consulted, appreciate recs  -TEE today showed no abnormalities -continue aspirin, atorvastatin  -plan for home with [facility] PT -plan for CEA in 2 weeks  # Carotid Stenosis - R>L, though symptomatic on L  -plan for CEA in 2 weeks   Dispo:  Plan for discharge today  The patient does not have a current PCP (No primary provider on file.) and does need an [facility] hospital follow-up appointment after discharge.  .Services Needed at time of discharge: Y = Yes, Blank = No PT:   OT:   RN:   Equipment:   Other:     LOS: 4 days   Linward Headland, MD 07/20/2013, 12:34 PM

## 2013-07-20 NOTE — Progress Notes (Signed)
Internal Medicine Attending  Date: 07/20/2013  Patient name: Juan [name] Medical record number: [id number] Date of birth: 09/04/1951 Age: 62 y.o. Gender: male  I saw and evaluated the patient. I reviewed the resident's note by Dr. Manson Passey and I agree with the resident's findings and plans as documented in his progress note.  Mr. Banfill is w/o new complaints and strength in RUE is unchanged from yesterday's examination.  TEE was unremarkable for a source of his recent anterior circulation embolic shower.  Neuro seems comfortable with the concept that the anterior circulation embolic shower was from both carotid arteries.  Thus, work-up is complete and he is stable for discharge home today with physical therapy.  He will be scheduled for CEA within next 2 weeks.  In the interim, we will continue statin and ASA therapy.

## 2013-07-20 NOTE — H&P (View-Only) (Signed)
Subjective: Wife at bedside this AM. Sitting up and had just finished breakfast. Denies any pain or acute complaints.   Spoke to patient and his wife with interpretor today and explained to him his disease, vascular findings, and lifestyle factors to manage his disease.  Depression: Spoke to daughter yesterday at bedside who does not feel her father has any symptoms consistent with DSM-V criteria for depression. Spends most of the day out of bed working on cars and outdoors with grandchildren prior to recent events. Offered option of treatment if she felt concerned given that it might take time for him to recover function.   TEE: not done yesterday due to scheduling error and has been rescheduled for Thursday @ 11am per [facility].   Vascular surgery: recommended intervention for left CEA two weeks out if neurologically stabled followed by right CEA several weeks thereafter. Per ACC/AHA guidelines, carotid endarterectomy is an intermediate risk procedure. Patient's risk factors include recent CVA, grade I diastolic dysfunction (EF 50-55% per echo 7/21), minor LVH per EKG (7/21). In absence of symptoms of CHF, patient only meets one risk factor of revised Goldman cardiac risk index placing him at a 1% post-operative risk for cardiac death, non-fatal MI, and non-fatal cardiac arrest.   Inpatient rehab: Per PT's note, will reconsider for inpatient rehab tomorrow following TEE.  Objective: Vital signs in last 24 hours: Filed Vitals:   07/18/13 1826 07/18/13 2200 07/19/13 0046 07/19/13 0525  BP: 134/72 146/75 148/73 136/70  Pulse: 78 76 71 80  Temp: 98.4 F (36.9 C) 98.2 F (36.8 C) 97.9 F (36.6 C) 98.3 F (36.8 C)  TempSrc: Oral Oral Oral Oral  Resp: 18 18    Height:      Weight:      SpO2: 97% 98% 97% 98%   Physical Exam General: alert, cooperative, lying in bed HEENT: pupils equal round and reactive to light, vision grossly intact, oropharynx clear and non-erythematous    Heart: regular rate and rhythm, no murmurs, gallops, or rubs Neurologic: alert & oriented X3 grossly, facial droop (L>R) stable from yesterday, cranial nerves otherwise intact, RUE 2/5, RLE 5/5, LUE & LLE 5/5 strength, sensation intact to light touch  Lab Results: BMET    Component Value Date/Time   NA 135 07/18/2013 0615   K 3.8 07/18/2013 0615   CL 100 07/18/2013 0615   CO2 26 07/18/2013 0615   GLUCOSE 144* 07/18/2013 0615   BUN 15 07/18/2013 0615   CREATININE 1.00 07/18/2013 0615   CALCIUM 9.3 07/18/2013 0615   GFRNONAA 78* 07/18/2013 0615   GFRAA >90 07/18/2013 0615   Medications: I have reviewed the patient's current medications. Scheduled Meds: . aspirin EC  81 mg Oral Daily  . atorvastatin  80 mg Oral q1800  . enoxaparin (LOVENOX) injection  40 mg Subcutaneous Q24H   Continuous Infusions: . sodium chloride 125 mL/hr at 07/16/13 2156  . sodium chloride     PRN Meds:.senna-docusate  Assessment/Plan: 62 year old [place] male recently found to have pre-diabetes, HTN, and dyslipidemia admitted for right-sided arm and leg weakness 2/2 acute ischemic stroke with possible depression and thyroid nodule.  #Acute ischemic stroke: Bilateral stroke likely 2/2 atherosclerotic changes at the carotid bifurcation (R > L) and proximal right subclavian/vertebral artery.   -Assessed to be at low pre-op risk for vascular surgery -TEE scheduled for tomorrow so will be NPO after midnight -Continue ASA -Continue neuro checks q4h -Appreciate neuro recs  #Pre-diabetes: A1c 6.4. Will address lifestyle factors and need  to establish care with PCP.  #HTN: BP 140+/90+ at office visit last week though trending lower now. Will continue watching and treat accordingly.   #Dyslipidemia: Continue atorvastatin. Will need to recheck lipids at f/u outpatient visit.       #Depression: Continues to appear improved but still unable to assess patient due to language barrier.  -Will continue assessing.    #Thyroid nodule: Recommend TSH check at outpatient f/u  #Disposition: Pending workup but possible in 1-2 days.   This is a Psychologist, occupational Note.  The care of the patient was discussed with Dr. Doneen Poisson and the assessment and plan formulated with their assistance.  Please see their attached note for official documentation of the daily encounter.   LOS: 3 days  Beather Arbour, Med Student 07/19/2013, 6:43 AM

## 2013-07-20 NOTE — Progress Notes (Signed)
OT NOTE  OT requesting Rt UE wrist cock up splint from ortho tech. Md called and order placed.  OT to help establish wear schedule to prevent subluxation and pain management.    Harrel Carina Halbur   OTR/L Pager: [phone] Office: [phone] .

## 2013-07-20 NOTE — Interval H&P Note (Signed)
History and Physical Interval Note:  07/20/2013 11:11 AM  Juan [name]  has presented today for surgery, with the diagnosis of STROKE  The various methods of treatment have been discussed with the patient and family. After consideration of risks, benefits and other options for treatment, the patient has consented to  Procedure(s): TRANSESOPHAGEAL ECHOCARDIOGRAM (TEE) (N/A) as a surgical intervention .  The patient's history has been reviewed, patient examined, no change in status, stable for surgery.  I have reviewed the patient's chart and labs.  Questions were answered to the patient's satisfaction.     Charlton Haws

## 2013-07-20 NOTE — Progress Notes (Signed)
Patient is to be discharged home today; home health care choices offered, patient and son chose [facility] for home health care needs; Mary with [facility] called for arrangements; Follow up hospital apt made with                              [facility] - for August 4,2014 at 12:30; [address] [place], [place] [address]  Hours of Operation Mon - Fri: 9 a.m. - 6 p.m. Main: [phone]

## 2013-07-20 NOTE — Telephone Encounter (Signed)
Message copied by Jena Gauss on Thu Jul 20, 2013  1:46 PM ------      Message from: Melene Plan      Created: Thu Jul 20, 2013 10:29 AM       He is going home today per [place]. I will find out from Dr CEF if needs cardiac clearance but for now make him an office visit the week of the 8/5 so we can schedule surgery also.      Thanks      Darel Hong      ----- Message -----         From: Sherren Kerns, MD         Sent: 07/18/2013   2:41 PM           To: Reuel Derby, Melene Plan, RN            Level 5 consult for symptomatic carotid stenosis      Requesting teaching service            He is probably going to rehab.  He needs cardiology eval prior to CEA hopefully will get this prior to Rehab.            Ideally he needs CEA in 2 weeks.  See if you can get him in to see someone week of August 5 as outpt and they can do his CEA week of Aug 5-13.  If he is still in Rehab they can see him there.  I think waiting til I return on Aug 18-19 is too long.  Let me know if questions.  He speaks some English but would be best to have [place] translator or daughter there if questions.            Charles       ------

## 2013-07-20 NOTE — Progress Notes (Signed)
Received patient from TEE, in bed resting comfortablly.  Tele in place, meal ordered.  No compliants at this time.

## 2013-07-20 NOTE — Progress Notes (Signed)
Orthopedic Tech Progress Note Patient Details:  Juan [name] January 02, 1951 [id number] Wrist cock up splint ordered for patient. At time of delivery patient was not in the room (procedure). Wrist splint left at nursing desk with secretary who called nurse to inform her splint had arrived.   Ortho Devices Type of Ortho Device: Velcro wrist splint Ortho Device/Splint Location: Right Ortho Device/Splint Interventions: Ordered   [name] 07/20/2013, 10:29 AM

## 2013-07-20 NOTE — Progress Notes (Signed)
  I have seen and examined the patient, and reviewed the daily progress note by Rushil Patel, MS4 and discussed the care of the patient with them. Please see my progress note from 07/20/2013 for further details regarding assessment and plan.    Signed:  Keilana Morlock K Jazmine Longshore, MD 07/20/2013, 5:35 PM   

## 2013-07-20 NOTE — Progress Notes (Signed)
Occupational Therapy Treatment Patient Details Name: Juan [name] MRN: [id number] DOB: 09/18/1951 Today's Date: 07/20/2013 Time: 2130-8657 OT Time Calculation (min): 11 min  OT Assessment / Plan / Recommendation  History of present illness 62 yo man presenting with weakness.  6 days PTA he had abrupt onset of right arm weakness, headache, and word-finding difficulty. Symptoms persisted unchanged until the morning of admission, when he experienced abrupt onset of right leg weakness as well. 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 These therefore suggest a recent anterior circulation embolic shower.    Clinical Impression Pt progressing towards goals. Session focused on review of HEP with son and Pt. Pt/son also educated on Right wrist cock up splint - and given handout on how/when to use it and care. See below for more details. Pt has supportive family and continues to benefit from skilled OT in the acute setting prior to d/c home with HHOT to improve use/function of RUE.      Follow Up Recommendations  Home health OT       Equipment Recommendations  None recommended by OT       Frequency Min 3X/week   Progress towards OT Goals Progress towards OT goals: Progressing toward goals  Plan Discharge plan remains appropriate    Precautions / Restrictions Precautions Precautions: Fall Precaution Comments: R hemiperesis Required Braces or Orthoses: Other Brace/Splint (R wrist cock up for sleep) Other Brace/Splint: Right wrist cock up splint Restrictions Weight Bearing Restrictions: No Other Position/Activity Restrictions: Pt and son educated to elevate hand above elbow when splint is on to prevent  edema   Pertinent Vitals/Pain Pt reported no pain during session    ADL  ADL Comments: Session focused on review of HEP with son and Pt. Pt/son also educated on Right wrist cock up splint - and given handout on how/when to use it and care.     OT Goals(current goals can now be found in the care plan section) Acute Rehab OT Goals Patient Stated Goal: To get the use of his RUE back OT Goal Formulation: With patient/family Time For Goal Achievement: 07/31/13 Potential to Achieve Goals: Good ADL Goals Pt Will Perform Grooming: with modified independence;standing Pt Will Perform Upper Body Dressing: with supervision;sitting Pt Will Perform Lower Body Dressing: with min guard assist;sit to/from stand Pt Will Perform Toileting - Clothing Manipulation and hygiene: with supervision;sit to/from stand  Visit Information  Last OT Received On: 07/20/13 Assistance Needed: +1 History of Present Illness: 62 yo man presenting with weakness.  6 days PTA he had abrupt onset of right arm weakness, headache, and word-finding difficulty. Symptoms persisted unchanged until the morning of admission, when he experienced abrupt onset of right leg weakness as well. 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 These therefore suggest a recent anterior circulation embolic shower.           Cognition  Cognition Arousal/Alertness: Awake/alert Behavior During Therapy: Flat affect Overall  Cognitive Status: Difficult to assess Difficult to assess due to: Impaired communication;Non-English speaking             End of Session OT - End of Session Activity Tolerance: Patient tolerated treatment well Patient left: in  bed;with call bell/phone within reach;with family/visitor present Nurse Communication: Mobility status;Precautions  GO     Sherryl Manges 07/20/2013, 3:08 PM

## 2013-07-20 NOTE — Progress Notes (Signed)
I agree with the following treatment note after reviewing documentation.   Johnston, Hailei Besser Brynn   OTR/L Pager: [phone] Office: [phone] .   

## 2013-07-20 NOTE — Progress Notes (Signed)
  Subjective: Patient and family asleep this AM before procedure. No acute complaints overnight. TEE unremarkable for patent foramen ovale or thrombus. Likely home today with health services.  Objective: Vital signs in last 24 hours: Filed Vitals:   07/20/13 1125 07/20/13 1132 07/20/13 1140 07/20/13 1150  BP: 158/83 138/66 115/63 107/68  Pulse:  60 60   Temp:  97.9 F (36.6 C)    TempSrc:  Oral    Resp: 15 10 12 13   Height:      Weight:      SpO2: 98% 99% 98% 98%   Physical Exam General: alert, cooperative, lying in bed HEENT: pupils equal round and reactive to light, vision grossly intact, oropharynx clear and non-erythematous  Heart: regular rate and rhythm, no murmurs, gallops, or rubs Neurologic: alert & oriented X3 grossly, facial droop (L>R) stable from yesterday, cranial nerves otherwise intact, RUE 2/5, RLE 5/5, LUE & LLE 5/5 strength, sensation intact to light touch  Medications: I have reviewed the patient's current medications. Scheduled Meds: . aspirin EC  81 mg Oral Daily  . atorvastatin  80 mg Oral q1800  . enoxaparin (LOVENOX) injection  40 mg Subcutaneous Q24H   Continuous Infusions: . sodium chloride 125 mL/hr at 07/16/13 2156  . sodium chloride 10 mL/hr at 07/20/13 1028   PRN Meds:.senna-docusate  Assessment/Plan: 62 year old [place] male recently found to have pre-diabetes, possible HTN, and dyslipidemia admitted for right-sided arm and leg weakness 2/2 acute ischemic stroke with possible depression and thyroid nodule.  #Acute ischemic stroke: Bilateral stroke likely 2/2 atherosclerotic changes at the carotid bifurcation (R > L) and proximal right subclavian/vertebral artery.   -Assessed to be at low pre-op risk for intermediate risk procedure (CEA) -Discharge on aspirin  #Pre-diabetes: A1c 6.4. Will need to f/u as outpatient with PCP.  #HTN: BP 140+/90+ at office visit last week but will f/u as outpatient with PCP given a few sporadic elevated  readings.   #Dyslipidemia: Discharge on atorvastatin 80mg  and will need to recheck lipids at f/u outpatient visit.       #Depression: Can f/u as outpatient if a concern.   #Thyroid nodule: Recommend TSH check at outpatient f/u  #Disposition: Stable for discharge today.   This is a Psychologist, occupational Note.  The care of the patient was discussed with Dr. Janalyn Harder and the assessment and plan formulated with their assistance.  Please see their attached note for official documentation of the daily encounter.   LOS: 4 days  Beather Arbour, Med Student 07/20/2013, 12:01 PM

## 2013-07-20 NOTE — Progress Notes (Signed)
Reviewed discharge instructions with patient and patients son, as not sure of patients comprehension of English.  Seemed to understand instructions.  No questions at this time.  Juan [name] [employer]

## 2013-07-21 NOTE — Discharge Summary (Signed)
Name: Juan [name] MRN: [id number] DOB: [birth year] 62 y.o. PCP: No primary provider on file.  Date of Admission: 07/16/2013  9:31 AM Date of Discharge: 07/20/2013 Attending Physician: Dr. Doneen Poisson  Discharge Diagnosis: 1. 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 Carotid stenosis - R > L stenosis; plan for CEA 2 weeks after discharge 3. Hypertriglyceridemia - started Atorvastatin 4. Pre-diabetes - A1c 6.4  Discharge Medications:   Medication List         aspirin 81 MG EC tablet  Take 1 tablet (81 mg total) by mouth daily.     atorvastatin 80 MG tablet  Commonly known as:  LIPITOR  Take 1 tablet (80 mg total) by mouth daily.        Disposition and follow-up:   Mr.Y KAILAN LAWS was discharged from [facility] in Stable condition.  At the hospital follow up visit please address:  1. Treatment adherence to aspirin and atorvastatin; follow-up lipid panel in 6-8 weeks to ensure improvement in Triglycerides (may need additional pharmacologic treatment if still elevated)  2. Re-iterate the importance of attending his pre-op appointment with vascular surgery for CEA on 8/5.  3.  Labs / imaging needed at time of follow-up: None  4.  Pending labs/ test needing follow-up: None  Follow-up Appointments: Follow-up Information   Follow up with Pleas Koch, MD On 07/27/2013. (2:45pm)    Contact information:   [address] [facility] INTERNAL MEDICINE [place] [address] [phone]       Follow up with EARLY, TODD, MD On 08/01/2013. (12:00pm)    Contact information:   [address] [place] [address] [phone]       Follow up with [facility] On 07/31/2013. (at 12:30; please try to keep apt or call to reschedule)    Contact information:   [facility] [address] [place], [place] [address] Hours of Operation Mon - Fri: 9 a.m. - 6 p.m. Main: [phone]          Discharge Instructions: Discharge Orders   Future Appointments Provider Department Dept Phone   07/24/2013 8:45 AM [facility] [place]) [facility] SITE 3 NUCLEAR MED [phone]   07/27/2013 2:45 PM Pleas Koch, MD [facility] [phone]   07/28/2013 2:00 PM Minda Meo, PA-C [place] [facility] [place]   07/31/2013 12:30 PM [facility] Covering Provider [place] [facility] [phone]   08/01/2013 12:00 PM Larina Earthly, MD [facility] [phone]   Future Orders Complete By Expires     Call MD for:  difficulty breathing, headache or visual disturbances  As directed     Call MD for:  persistant dizziness or light-headedness  As directed     Call MD for:  persistant nausea and vomiting  As directed     Call MD for:  severe uncontrolled pain  As directed     Diet - low sodium heart healthy  As directed     Increase activity slowly  As directed        Consultations: Vascular Surgery (Dr. Darrick Penna), Neurology (Dr. Pearlean Brownie)  Procedures Performed:  Ct Head Wo Contrast  07/16/2013   *RADIOLOGY REPORT*  Clinical Data: Right arm/leg weakness  CT HEAD WITHOUT CONTRAST  Technique:  Contiguous axial images were obtained from the base of the skull through the vertex without contrast.  Comparison: None.  Findings: No evidence of parenchymal hemorrhage or extra-axial fluid collection. No mass lesion, mass effect, or midline shift.  Multiple subcortical and cortical hypodensities, including in the left frontal lobe (series 2/images 20-22), subcortical right frontal lobe (series 2/image 17), right basal ganglia (series 2/image 13), and left occipital lobe (series 2/image 18).  While most of this is likely chronic, left MCA distribution cortical infarcts in the posterior left frontal lobe/motor strip (series 2/image 22) and left occipital lobe (series 2/image 18) may be  subacute.  Subcortical white matter and periventricular small vessel ischemic changes.  Intracranial atherosclerosis.  Mild global cortical atrophy for age.  No ventriculomegaly.  The visualized paranasal sinuses are essentially clear. The mastoid air cells are unopacified.  Left mastoid is under pneumatized.  No evidence of calvarial fracture.  IMPRESSION: Possible subacute left MCA distribution infarcts, as described above.  Additional multifocal chronic infarcts, small vessel ischemic changes, and intracranial atherosclerosis.  These results were called by telephone on 07/16/2013 at 1100 hours to Eber Hong, who verbally acknowledged these results.   Original Report Authenticated By: Charline Bills, M.D.   Ct Angio Neck W/cm &/or Wo/cm  07/17/2013   *RADIOLOGY REPORT*  Clinical Data:  Acute infarcts.  Syncopal episode.  Headache.  CT ANGIOGRAPHY NECK  Technique:  Multidetector CT imaging of the neck was performed using the standard protocol during bolus administration of intravenous contrast.  Multiplanar CT image reconstructions including MIPs were obtained to evaluate the vascular anatomy. Carotid stenosis measurements (when applicable) are obtained utilizing NASCET criteria, using the distal internal carotid diameter as the denominator.  Contrast: 50mL OMNIPAQUE IOHEXOL 300 MG/ML  SOLN  Comparison:  07/16/2013 MR brain and MRA angiogram  Findings:  Carotid bifurcation complex plaque bilaterally.  84% diameter stenosis proximal right internal carotid artery.  72% diameter stenosis proximal left internal carotid artery.  There may be an ulceration just beyond this left internal carotid artery proximal stenosis.  Beyond the bifurcation, there is decreased caliber of the right internal carotid artery compared to the left.  Calcified plaque petrous and cavernous segment of the internal carotid artery bilaterally.  Plaque with mild narrowing proximal right subclavian artery.  Minimal narrowing proximal right  vertebral artery.  No significant narrowing of the left subclavian artery.  Mild narrowing proximal left vertebral artery (nondominant vertebral artery).  Heterogeneous enlarged thyroid gland with substernal extension suggestive of a goiter.  Left lobe calcification.  Right lobe with questionable 1.2 cm dominant mass.  This can be followed by thyroid ultrasound.  Calcification palatine tonsil region consistent with prior inflammation.  Minimal asymmetry glottic region with questionable fullness on the left however, no discrete mass identified.  Prior surgery of fusion C4-C7. Spinal stenosis at several levels with cord flattening incompletely assessed.  Dental disease.  Scattered increased number of normal sized lymph nodes of questionable significance.  Lung apical changes without worrisome mass noted.   Review of the MIP images confirms the above findings.  IMPRESSION: Carotid bifurcation complex plaque bilaterally.  84% diameter stenosis proximal right internal carotid artery.  72% diameter stenosis proximal left internal carotid artery.  There may be an ulceration just beyond this left internal carotid artery proximal stenosis.  Beyond the bifurcation, there is decreased caliber of the right internal carotid artery compared to the left.  Calcified plaque petrous and cavernous segment of the internal carotid artery bilaterally.  Plaque with mild narrowing proximal right subclavian artery.  Minimal narrowing proximal right vertebral artery.  No significant narrowing of  the left subclavian artery.  Mild narrowing proximal left vertebral artery (nondominant vertebral artery).  Heterogeneous enlarged thyroid gland with substernal extension suggestive of a goiter.  Left lobe calcification.  Right lobe with questionable 1.2 cm dominant mass.  This can be followed by thyroid ultrasound.  Prior surgery of fusion C4-C7. Spinal stenosis at several levels with cord flattening incompletely assessed.   Original Report  Authenticated By: Lacy Duverney, M.D.   Mr [facility] Wo Contrast  07/16/2013   *RADIOLOGY REPORT*  Clinical Data:  62 year old male with right upper extremity weakness, headache, CT suggesting left MCA infarcts and small vessel disease.  Contrast: 15mL MULTIHANCE GADOBENATE DIMEGLUMINE 529 MG/ML IV SOLN  Comparison: Head CT without contrast 07/16/2013.  MRI HEAD WITHOUT AND WITH CONTRAST  Technique: Multiplanar, multiecho pulse sequences of the brain and surrounding structures were obtained according to standard protocol without and with intravenous contrast.  Findings:  Cortically based areas in the left hemisphere described on CT show confluent gyral and subcortical restricted diffusion. These are in the posterior left MCA territory, and also the lateral aspect of the left occipital lobe and occipital pole (suggesting left PCA component).  There also scattered punctate areas of restricted diffusion in the anterior superior left MCA territory. Furthermore, on the right there is confluent restricted diffusion at the caudothalamic notch corresponding to an area of well developed hypodensity on the CT.  This is accompanied by scattered punctate mostly cortically based areas of restricted diffusion in the anterior superior right MCA territory, as well as a small focal area of restricted diffusion in the genu of the corpus callosum (series 3 image 16), ACA territory.  No posterior fossa restricted diffusion. Major intracranial vascular flow voids are preserved, MRA findings are below.  No acute intracranial hemorrhage identified.  There is a chronic micro hemorrhage near the atrium of the left lateral ventricle associated with chronic lacunar infarct.  Following contrast there is petechial ischemia related enhancement in the confluent left MCA and left occipital / PCA infarcts.  No other abnormal enhancement.  Some of the other hypodensity on the recent comparison also are chronic small vessel disease (left MCA  subcortical white matter on series 6 image 14).  Mild edema in the acutely affected areas.  No mass effect.  No ventriculomegaly.  Negative pituitary and cervicomedullary junction.  Visualized cervical spine remarkable for partially visible spinal hardware at C4.  Normal bone marrow signal. Visualized orbit soft tissues are within normal limits.  Visualized paranasal sinuses and mastoids are clear.  Negative scalp soft tissues.  IMPRESSION: 1.  Confluent posterior left MCA and left MCA / PCA watershed acute infarcts corresponding to the cortically based abnormality on the recent head CT. 2.  Addition of multiple small punctate acute infarcts in both anterior superior MCA territory is, as well as a 1-2 cm acute lacunar infarct in the right caudothalamic notch (also visible on CT), plus a  lacunar infarct in the genu of the corpus callosum (ACA territory).  These therefore suggest a recent  anterior circulation embolic shower. 3.  No mass effect or hemorrhage associated with the above. 4.  Underlying chronic small vessel ischemia. 5.  See MRA findings below.  MRA NECK WITHOUT AND WITH CONTRAST  Technique:  Angiographic images of the neck were obtained using MRA technique without and with intravenous contrast.  Carotid stenosis measurements (when applicable) are obtained utilizing NASCET criteria, using the distal internal carotid diameter as the denominator.  Findings:  Precontrast time-of-flight imaging of the  neck. Thyromegaly is evident but incompletely evaluated.  Antegrade flow in both carotid and vertebral arteries in the neck to the skull base.  The right vertebral artery is dominant.  Postcontrast time-of-flight images.  Three-vessel arch configuration with no great vessel origin stenosis.  Right common carotid artery origin is normal to the carotid bifurcation.  There is then a high-grade stenosis at the right ICA origin resulting in a 5 mm flow gap/radiographic string sign. Despite this there is distal  enhancement/patency of the vessel to the skull base.  Left CCA is within normal limits.  At the left carotid bifurcation there is irregularity compatible with atherosclerotic plaque, but no hemodynamically significant left ICA origin or proximal stenosis.  Elsewhere the cervical left ICA appears within normal limits.  Dominant right vertebral artery is patent throughout the neck without stenosis.  Nondominant left vertebral artery may be mildly stenosed at its origin, but otherwise is within normal limits throughout the neck.  Intracranial findings are below.  IMPRESSION: 1.  MRA suggests a high-grade stenosis at the right ICA origin but patent right ICA to the skull base.  See intracranial findings below. 2.  Left carotid bifurcation atherosclerosis without stenosis. 3.  Dominant right vertebral artery without evidence of hemodynamically significant vertebral artery stenosis.  MRA HEAD WITHOUT CONTRAST  Technique: Angiographic images of the Circle of Willis were obtained using MRA technique without  intravenous contrast.  Findings:  Antegrade flow in the posterior circulation with dominant distal right vertebral artery.  Normal left PICA (or may be duplicated).  Dominant right AICA.  Patent vertebrobasilar junction.  Mild basilar artery fenestration proximally.  No basilar stenosis.  SCA and PCA origins are normal.  Posterior communicating arteries are diminutive or absent.  Bilateral PCA branches are irregular throughout, but remain patent.  There is bilateral distal P2 and P3 segment tandem stenoses (series 403 image 10).  Antegrade flow in both ICA siphons, the right ICA is smaller than the left.  Also the right ICA terminus is hypoplastic owing to a dominant left ACA A1 segment.  No focal ICA siphon stenosis. Normal MCA and left ACA origins.  The anterior communicating artery may be fenestrated but otherwise is within normal limits.  The left ACA appears to remain dominant. Visualized ACA branches are within  normal limits.  Bilateral MCA M1 segments remain patent.  Both MCA bifurcations are patent without stenosis.  Mild if any irregularity of the anterior sylvian left MCA branches.  No major branch occlusion or stenosis identified.  IMPRESSION: 1.  Essentially negative anterior circulation.  Nondominant right ICA and ICA terminus.  No MCA branch occlusion or stenosis. 2.  Negative proximal posterior circulation, but moderate irregularity with tandem stenoses in the bilateral PCA branches. Bilateral preserved distal flow.   Original Report Authenticated By: Erskine Speed, M.D.    Transesophageal Echo 07/20/13 TEE: 3 mg versed 50 ug fentanyl  Normal EF 55%  Normal atria  No LAA thrombus  Normal AV, TV and PV  Mild MR  No ASD negative bubble study  Normal aorta  No source of embolus  Admission HPI:  The patient is a 62 yo man, no known PMH, presenting with weakness. The patient notes that 6 days ago, at 6am, he had abrupt onset of right arm weakness, headache, and word-finding difficulty. Symptoms persisted unchanged until the morning of admission, when he experienced abrupt onset of right leg weakness as well. The patient's family notes that the right side of his face also looks "  puffy", though no obvious facial droop. The patient notes no numbness/tingling, visual changes, dizziness, or imbalance. He does not seek health care regularly, but notes no prior HTN, HL, or other medical problems. The patient notes neck surgery in 2003 for cord compression.  Hospital Course by problem list: 1.  CVA: Presented with 7-day history of RUE weakness and acute onset RLE weakness. Non-contrast head CT showed sub-acute infarcts, and follow-up MR/MRA neck and brain studies showed acute infarcts of left MCA/PCA watershed region and right caudothalamic notch suggestive of thromboemboli from a more proximal source. CTA neck showed bilateral atherosclerotic plaques at the carotid bifurcation, carotid artery stenosis (84% R  vs. 72% L), and mild narrowing of the right subclavian artery. Vascular surgery recommended bilateral carotid endarterectomy starting with the left in two weeks given stable neurologic exam. TTE and TEE were unremarkable for thrombus or patent foramen ovale. Started aspirin 81mg . Discharged to home with home health services.           2. Carotid stenosis: LDL 254, TG 1006. Started atorvastatin 80mg  and aspirin.  Plan for bilateral carotid endarterectomy per above.    3. Hypertriglyceridemia: The patient was found to have a triglyceride level of 1006.  After discussion, the patient was started on atorvastatin, due to the benefit of a high-intensity statin in patients with CVA.  If this fails to significantly lower the patient's TG level, he may need further treatment to reduce the risk of complications such as pancreatitis.  4. Pre-diabetes: A1c 6.4 at office visit one week prior to admission. Glucose ranged 140-160 during admission. Discussed lifestyle modifications at time of discharge.  Discharge Vitals:   BP 143/80  Pulse 73  Temp(Src) 97.7 F (36.5 C) (Oral)  Resp 18  Ht 5\' 4"  (1.626 m)  Wt 165 lb (74.844 kg)  BMI 28.31 kg/m2  SpO2 99%  Discharge Labs:     Component Value Date/Time   NA 135 07/18/2013 0615   K 3.8 07/18/2013 0615   CL 100 07/18/2013 0615   CO2 26 07/18/2013 0615   GLUCOSE 144* 07/18/2013 0615   BUN 15 07/18/2013 0615   CREATININE 1.00 07/18/2013 0615   CALCIUM 9.3 07/18/2013 0615   GFRNONAA 78* 07/18/2013 0615   GFRAA >90 07/18/2013 0615      Component Value Date/Time   WBC 10.1 07/17/2013 0520   RBC 5.31 07/17/2013 0520   HGB 13.7 07/17/2013 0520   HCT 41.8 07/17/2013 0520   PLT 187 07/17/2013 0520   MCV 78.7 07/17/2013 0520   MCH 25.8* 07/17/2013 0520   MCHC 32.8 07/17/2013 0520   RDW 14.0 07/17/2013 0520   LYMPHSABS 2.7 07/17/2013 0520   MONOABS 0.6 07/17/2013 0520   EOSABS 1.5* 07/17/2013 0520   BASOSABS 0.1 07/17/2013 0520    Signed: Linward Headland, MD 07/21/2013,  4:42 PM   Time Spent on Discharge: 45 minutes Services Ordered on Discharge: [facility] PT Equipment Ordered on Discharge: None

## 2013-07-24 ENCOUNTER — Ambulatory Visit (HOSPITAL_COMMUNITY): Payer: Medicaid Other | Attending: Internal Medicine | Admitting: Radiology

## 2013-07-24 ENCOUNTER — Encounter (HOSPITAL_COMMUNITY): Payer: Self-pay | Admitting: Cardiovascular Disease

## 2013-07-24 VITALS — BP 122/83 | HR 67 | Ht 64.0 in | Wt 157.0 lb

## 2013-07-24 DIAGNOSIS — I63239 Cerebral infarction due to unspecified occlusion or stenosis of unspecified carotid arteries: Secondary | ICD-10-CM

## 2013-07-24 DIAGNOSIS — I779 Disorder of arteries and arterioles, unspecified: Secondary | ICD-10-CM | POA: Insufficient documentation

## 2013-07-24 DIAGNOSIS — Z87891 Personal history of nicotine dependence: Secondary | ICD-10-CM | POA: Insufficient documentation

## 2013-07-24 DIAGNOSIS — Z01810 Encounter for preprocedural cardiovascular examination: Secondary | ICD-10-CM

## 2013-07-24 DIAGNOSIS — I739 Peripheral vascular disease, unspecified: Secondary | ICD-10-CM | POA: Insufficient documentation

## 2013-07-24 DIAGNOSIS — E785 Hyperlipidemia, unspecified: Secondary | ICD-10-CM | POA: Insufficient documentation

## 2013-07-24 DIAGNOSIS — R002 Palpitations: Secondary | ICD-10-CM | POA: Insufficient documentation

## 2013-07-24 DIAGNOSIS — I4949 Other premature depolarization: Secondary | ICD-10-CM

## 2013-07-24 DIAGNOSIS — Z8673 Personal history of transient ischemic attack (TIA), and cerebral infarction without residual deficits: Secondary | ICD-10-CM | POA: Insufficient documentation

## 2013-07-24 MED ORDER — TECHNETIUM TC 99M SESTAMIBI GENERIC - CARDIOLITE
33.0000 | Freq: Once | INTRAVENOUS | Status: AC | PRN
  Administered 2013-07-24: 33 via INTRAVENOUS

## 2013-07-24 MED ORDER — TECHNETIUM TC 99M SESTAMIBI GENERIC - CARDIOLITE
10.3000 | Freq: Once | INTRAVENOUS | Status: AC | PRN
  Administered 2013-07-24: 10 via INTRAVENOUS

## 2013-07-24 MED ORDER — REGADENOSON 0.4 MG/5ML IV SOLN
0.4000 mg | Freq: Once | INTRAVENOUS | Status: AC
  Administered 2013-07-24: 0.4 mg via INTRAVENOUS

## 2013-07-24 NOTE — Progress Notes (Signed)
[facility] SITE 3 NUCLEAR MED [address] [place], [place] [address] [phone]    Cardiology Nuclear Med Juan [name] is a 62 y.o. male     MRN : [id number]     DOB: Jun 21, 1951  Procedure Date: 07/24/2013  Nuclear Med Background Indication for Stress Test:  Evaluation for Ischemia, 07/16/13 CVA, negative troponin's and Clearance for Pending CEA by Dr. Fabienne Bruns History:  07/17/13 Echo:EF=55%, mild LVH Cardiac Risk Factors: Carotid Disease, CVA, History of Smoking, Lipids and PVD  Symptoms:  Palpitations   Nuclear Pre-Procedure Caffeine/Decaff Intake:  None> 12 hrs NPO After: 7:00am cereal  Lungs:  Rhonchi, no wheezes. O2 Sat: 96% on room air. IV 0.9% NS with Angio Cath:  20g  IV Site: L Wrist x 1, tolerated well IV Started by:  Irean Hong, RN  Chest Size (in):  36 Cup Size: n/a  Height: 5\' 4"  (1.626 m)  Weight:  157 lb (71.215 kg)  BMI:  Body mass index is 26.94 kg/(m^2). Tech Comments:  n/a    Nuclear Med Study 1 or 2 day study: 1 day  Stress Test Type:  Lexiscan  Reading MD: Dietrich Pates, MD  Order Authorizing Provider:  Fabienne Bruns, MD  Resting Radionuclide: Technetium 53m Sestamibi  Resting Radionuclide Dose: 10.3 mCi   Stress Radionuclide:  Technetium 85m Sestamibi  Stress Radionuclide Dose: 33.0 mCi           Stress Protocol Rest HR: 67 Stress HR: 90  Rest BP: 122/83 Stress BP: 140/81  Exercise Time (min): n/a METS: n/a   Predicted Max HR: 157 bpm % Max HR: 57.32 bpm Rate Pressure Product: 69629   Dose of Adenosine (mg):  n/a Dose of Lexiscan: 0.4 mg  Dose of Atropine (mg): n/a Dose of Dobutamine: n/a mcg/kg/min (at max HR)  Stress Test Technologist: Smiley Houseman, CMA-N  Nuclear Technologist:  Domenic Polite, CNMT     Rest Procedure:  Myocardial perfusion imaging was performed at rest 45 minutes following the intravenous administration of Technetium 16m Sestamibi.  Rest ECG: NSR with non-specific ST-T wave changes  Stress  Procedure:  The patient received IV Lexiscan 0.4 mg over 15-seconds.  Technetium 26m Sestamibi injected at 30-seconds.  The patient was hypotensive with dizziness and was given 250 cc of normal saline with relief.  Quantitative spect images were obtained after a 45 minute delay.  Stress ECG: Nondiagnostic due to baseline ECG changes.  QPS Raw Data Images:  Soft tissue (diaphragm) underlies heart. Stress Images:  Normal homogeneous uptake in all areas of the myocardium. Rest Images:  Normal homogeneous uptake in all areas of the myocardium. Subtraction (SDS):  No evidence of ischemia. Transient Ischemic Dilatation (Normal <1.22):  n/a Lung/Heart Ratio (Normal <0.45):  0.31  Quantitative Gated Spect Images QGS EDV:  88 ml QGS ESV:  43 ml  Impression Exercise Capacity:  Lexiscan with no exercise. BP Response:  Normal blood pressure response. Clinical Symptoms:  No chest pain. ECG Impression: Nondiagnostic due to baseline changes. Comparison with Prior Nuclear Study: No previous nuclear study performed  Overall Impression:  Normal stress nuclear study.  LV Ejection Fraction: 51%.  LV Wall Motion:  Normal Wall Motion  Dietrich Pates

## 2013-07-27 ENCOUNTER — Ambulatory Visit (INDEPENDENT_AMBULATORY_CARE_PROVIDER_SITE_OTHER): Payer: Medicaid Other | Admitting: Internal Medicine

## 2013-07-27 ENCOUNTER — Encounter: Payer: Self-pay | Admitting: Internal Medicine

## 2013-07-27 VITALS — BP 150/78 | HR 76 | Temp 97.0°F | Wt 157.3 lb

## 2013-07-27 DIAGNOSIS — I639 Cerebral infarction, unspecified: Secondary | ICD-10-CM

## 2013-07-27 DIAGNOSIS — I6523 Occlusion and stenosis of bilateral carotid arteries: Secondary | ICD-10-CM

## 2013-07-27 DIAGNOSIS — I10 Essential (primary) hypertension: Secondary | ICD-10-CM | POA: Insufficient documentation

## 2013-07-27 DIAGNOSIS — E785 Hyperlipidemia, unspecified: Secondary | ICD-10-CM

## 2013-07-27 DIAGNOSIS — I6350 Cerebral infarction due to unspecified occlusion or stenosis of unspecified cerebral artery: Secondary | ICD-10-CM

## 2013-07-27 DIAGNOSIS — I6529 Occlusion and stenosis of unspecified carotid artery: Secondary | ICD-10-CM

## 2013-07-27 NOTE — Assessment & Plan Note (Signed)
A:  Stable since discharge 1 week ago.   P:  Plan for the patient to continue medical management with ASA and statin. The patient will see vascular surgery Aug 5 for surgery eval.

## 2013-07-27 NOTE — Assessment & Plan Note (Signed)
A:  Stable since discharge 1 week ago. Patient notes mild improvement of muscles within his right hand.  P:  Plan for the patient to continue medical management with ASA and statin. The patient will see vascular surgery Aug 5 for surgery eval.

## 2013-07-27 NOTE — Progress Notes (Signed)
Subjective:   Patient ID: Juan [name] male   DOB: October 04, 1951 62 y.o.   MRN: [id number]  HPI: Juan [name] is a 62 y.o. man with a pmhx of recent CVA who comes to the clinic today to establish care and for hospital follow-up. He was discharged one week ago after suffering a CVA 63/2/ carotid stenosis carotid stenosis. The patient has done well since discharge. He reports that he is ambulating without problems. He has very little control over his hand. His daughter states that he has been having some difficulty with his mental status in that he has called his family members by the wrong name and been confused at times. The patient reports compliance with asprin and statin therapy since discharge. His main issue is establishing care with vascular surgery for evaluation of his symptomatic carotid stenosis for surgery. The patient has no insurance.   Past Medical History  Diagnosis Date  . Stroke    Current Outpatient Prescriptions  Medication Sig Dispense Refill  . aspirin EC 81 MG EC tablet Take 1 tablet (81 mg total) by mouth daily.  30 tablet  1  . atorvastatin (LIPITOR) 80 MG tablet Take 1 tablet (80 mg total) by mouth daily.  30 tablet  1   No current facility-administered medications for this visit.   No family history on file. History   Social History  . Marital Status: Married    Spouse Name: N/A    Number of Children: N/A  . Years of Education: N/A   Social History Main Topics  . Smoking status: Never Smoker   . Smokeless tobacco: None  . Alcohol Use: No     Comment: a few drinks occasionally   . Drug Use: No  . Sexually Active: None   Other Topics Concern  . None   Social History Narrative   Lives at home with wife, son, and grandchildren.   Review of Systems:  Review of Systems  Constitutional: Negative for fever, chills and weight loss.  HENT: Negative for sore throat.   Eyes: Negative for blurred vision and double vision.  Respiratory: Negative for cough and shortness of  breath.   Cardiovascular: Negative for chest pain, palpitations and leg swelling.  Gastrointestinal: Negative for nausea, vomiting, diarrhea and constipation.  Genitourinary: Negative for dysuria, urgency and frequency.  Skin: Negative for rash.  Neurological: Positive for focal weakness. Negative for sensory change and headaches.     Objective:  Physical Exam: Filed Vitals:   07/27/13 1507  BP: 150/78  Pulse: 76  Temp: 97 F (36.1 C)  TempSrc: Oral  Weight: 157 lb 4.8 oz (71.351 kg)  SpO2: 98%   Physical Exam  Constitutional: He appears well-developed and well-nourished.  HENT:  Head: Normocephalic and atraumatic.  Mouth/Throat: Oropharynx is clear and moist. No oropharyngeal exudate.  Eyes: EOM are normal. Pupils are equal, round, and reactive to light.  Neck: Normal range of motion. Neck supple.  No carotid bruits  Cardiovascular: Normal rate, regular rhythm, normal heart sounds and intact distal pulses.  Exam reveals no friction rub.   No murmur heard. Pulmonary/Chest: Effort normal and breath sounds normal. No respiratory distress. He has no wheezes. He has no rales.  Abdominal: Soft. Bowel sounds are normal. He exhibits no distension. There is no tenderness.  Neurological: Coordination abnormal.  1-2/5 muscle strength in the patients right hand. 3/5 Biceps and triceps. 5/5 in the remaining major muscle groups. Intact to light touch in both UE and LE.  Assessment & Plan:

## 2013-07-27 NOTE — Assessment & Plan Note (Signed)
BP Readings from Last 3 Encounters:  07/27/13 150/78  07/24/13 122/83  07/20/13 143/80    Lab Results  Component Value Date   NA 135 07/18/2013   K 3.8 07/18/2013   CREATININE 1.00 07/18/2013    Assessment: Comments: Patient has mildly elevated blood pressure at two visits, but was normal at multiple readings during hospitalization.  Plan: Medications:  I do not plan to start medications today. Other plans: I will recheck at future visits and consider a low-dose thiazide at that time.

## 2013-07-27 NOTE — Patient Instructions (Addendum)
Please go to appointment with surgeon on August 5  I am checking some blood work, and I will call you if there are abnormalities.  Follow-up with PCP in 4 weeks

## 2013-07-28 ENCOUNTER — Ambulatory Visit (INDEPENDENT_AMBULATORY_CARE_PROVIDER_SITE_OTHER): Payer: Medicaid Other | Admitting: Cardiology

## 2013-07-28 ENCOUNTER — Encounter: Payer: Self-pay | Admitting: Cardiology

## 2013-07-28 VITALS — BP 134/84 | HR 82 | Ht 64.0 in | Wt 159.0 lb

## 2013-07-28 DIAGNOSIS — Z01810 Encounter for preprocedural cardiovascular examination: Secondary | ICD-10-CM

## 2013-07-28 LAB — LIPID PANEL
HDL: 36 mg/dL — ABNORMAL LOW (ref >39)
LDL Cholesterol: 44 mg/dL (ref 0–99)
Total CHOL/HDL Ratio: 3.2 Ratio

## 2013-07-28 NOTE — Progress Notes (Signed)
I saw and evaluated the patient.  I personally confirmed the key portions of the history and exam documented by Dr. Komanski and I reviewed pertinent patient test results.  The assessment, diagnosis, and plan were formulated together and I agree with the documentation in the resident's note.  

## 2013-07-30 NOTE — Progress Notes (Signed)
CARDIOLOGY OFFICE NOTE  Patient ID: Juan [name] [name] MRN: [id number], DOB/AGE: Dec 08, 1951   Date of Visit: 07/28/2013  Primary Physician: Juan [name] Peek, MD Primary Cardiologist: New to [place] Reason for Visit: Pre-operative cardiac evaluation  History of Present Illness  Juan [name] [name] is a 62 Juan [name].o. male with dyslipidemia and DM who was admitted with an acute CVA on 07/16/2013. He was found to have significant carotid artery stenosis and is scheduled to undergo CEA next week. He has been referred for pre-operative cardiac evaluation. During his recent hospitalization he underwent TEE which revealed no source of embolus - normal LV size and function, EF 55%, mild MR otherwise no valvular abnormalities, no PFO/ASD, normal aorta, normal atria. He also underwent a nuclear stress test which was negative for ischemia.       Today he reports he is doing well and has no complaints. He denies chest pain or shortness of breath. He denies palpitations, dizziness, near syncope or syncope. He denies LE swelling, orthopnea, PND or recent weight gain. Of note, Juan [name] [name] speaks [place] and a translator was utilized during the entire office visit.  Past Medical History Past Medical History  Diagnosis Date  . Stroke     Past Surgical History Past Surgical History  Procedure Laterality Date  . Neck surgery  2003    Cord Compression  . Tee without cardioversion N/A 07/20/2013    Procedure: TRANSESOPHAGEAL ECHOCARDIOGRAM (TEE);  Surgeon: Juan [name] Stade, MD;  Location: [facility] ENDOSCOPY;  Service: Cardiovascular;  Laterality: N/A;    Allergies/Intolerances No Known Allergies  Current Home Medications Current Outpatient Prescriptions  Medication Sig Dispense Refill  . aspirin EC 81 MG EC tablet Take 1 tablet (81 mg total) by mouth daily.  30 tablet  1  . atorvastatin (LIPITOR) 80 MG tablet Take 1 tablet (80 mg total) by mouth daily.  30 tablet  1   No current facility-administered medications for this visit.    Social History Social History  . Marital Status: Married   Social History Main Topics  . Smoking status: Never Smoker   . Smokeless tobacco: Not on file  . Alcohol Use: No     Comment: a few drinks occasionally   . Drug Use: No   Social History Narrative   Lives at home with wife, son, and grandchildren.    Review of Systems General: No chills, fever, night sweats or weight changes Cardiovascular: No chest pain, dyspnea on exertion, edema, orthopnea, palpitations, paroxysmal nocturnal dyspnea Dermatological: No rash, lesions or masses Respiratory: No cough, dyspnea Urologic: No hematuria, dysuria Abdominal: No nausea, vomiting, diarrhea, bright red blood per rectum, melena, or hematemesis Neurologic: No visual changes, weakness, changes in mental status All other systems reviewed and are otherwise negative except as noted above.  Physical Exam Vitals: Blood pressure 134/84, pulse 82, height 5\' 4"  (1.626 m), weight 159 lb (72.122 kg).  General: Well developed, well appearing 62 Juan [name].o. male in no acute distress. HEENT: Normocephalic, atraumatic. EOMs intact. Sclera nonicteric. Oropharynx clear.  Neck: Supple. No JVD. Lungs: Respirations regular and unlabored, CTA bilaterally. No wheezes, rales or rhonchi. Heart: RRR. S1, S2 present. No murmurs, rub, S3 or S4. Abdomen: Soft, non-distended.  Extremities: No clubbing, cyanosis or edema. PT/Radials 2+ and equal bilaterally. Psych: Normal affect. Neuro: Alert and oriented X 3. Moves all extremities spontaneously.   Diagnostics 1. TEE 07/20/2013 Normal EF 55%  Normal atria  No LAA thrombus  Normal AV, TV and PV  Mild MR  No ASD  negative bubble study  Normal aorta  No source of embolus 2. Lexiscan nuclear stress test 07/24/2013 Overall Impression: Normal stress nuclear study.  LVEF: 51%. LV Wall Motion: Normal Wall Motion   Assessment and Plan 1. Recent CVA 2. Significant carotid artery stenosis, awaiting CEA next  week 3. Recent nuclear stress test negative for ischemia 4. Normal LV function  Juan [name] [name] presents for pre-operative cardiac evaluation. As outlined in HPI, he has no cardiac complaints or symptoms. He has no history of CAD/MI, valvular heart disease, cardiac arrhythmias or CHF. He has normal LV function. He has undergone recent cardiac risk stratification with a nuclear stress test which was negative for ischemia. Based on the above findings, his risk for peri-operative cardiac event is low.    This plan of care was formulated with Dr. Dietrich [name] who was in the interview and examine the patient with me. Signed, Juan [name] Duff, PA-C 07/30/2013, 12:16 AM

## 2013-07-31 ENCOUNTER — Encounter: Payer: Self-pay | Admitting: Vascular Surgery

## 2013-07-31 ENCOUNTER — Ambulatory Visit: Payer: 59

## 2013-08-01 ENCOUNTER — Encounter: Payer: Self-pay | Admitting: Vascular Surgery

## 2013-08-01 ENCOUNTER — Encounter (HOSPITAL_COMMUNITY): Payer: Self-pay

## 2013-08-01 ENCOUNTER — Other Ambulatory Visit: Payer: Self-pay

## 2013-08-01 ENCOUNTER — Ambulatory Visit (INDEPENDENT_AMBULATORY_CARE_PROVIDER_SITE_OTHER): Payer: Medicaid Other | Admitting: Vascular Surgery

## 2013-08-01 ENCOUNTER — Encounter (HOSPITAL_COMMUNITY)
Admission: RE | Admit: 2013-08-01 | Discharge: 2013-08-01 | Disposition: A | Discharge status: Home / Self Care | Payer: Medicaid Other | Source: Ambulatory Visit | Attending: Vascular Surgery | Admitting: Vascular Surgery

## 2013-08-01 ENCOUNTER — Ambulatory Visit (HOSPITAL_COMMUNITY)
Admission: RE | Admit: 2013-08-01 | Discharge: 2013-08-01 | Disposition: A | Discharge status: Home / Self Care | Payer: Medicaid Other | Source: Ambulatory Visit | Attending: Anesthesiology | Admitting: Anesthesiology

## 2013-08-01 VITALS — BP 130/110 | HR 69 | Resp 16 | Ht 64.0 in | Wt 156.7 lb

## 2013-08-01 DIAGNOSIS — I63239 Cerebral infarction due to unspecified occlusion or stenosis of unspecified carotid arteries: Secondary | ICD-10-CM

## 2013-08-01 DIAGNOSIS — Z01812 Encounter for preprocedural laboratory examination: Secondary | ICD-10-CM | POA: Insufficient documentation

## 2013-08-01 DIAGNOSIS — Z01818 Encounter for other preprocedural examination: Secondary | ICD-10-CM | POA: Insufficient documentation

## 2013-08-01 HISTORY — DX: Hyperlipidemia, unspecified: E78.5

## 2013-08-01 LAB — APTT: aPTT: 30 seconds (ref 24–37)

## 2013-08-01 LAB — COMPREHENSIVE METABOLIC PANEL
Alkaline Phosphatase: 139 U/L — ABNORMAL HIGH (ref 39–117)
BUN: 17 mg/dL (ref 6–23)
CO2: 27 mEq/L (ref 19–32)
GFR calc Af Amer: 62 mL/min — ABNORMAL LOW (ref >90)
GFR calc non Af Amer: 54 mL/min — ABNORMAL LOW (ref >90)
Glucose, Bld: 210 mg/dL — ABNORMAL HIGH (ref 70–99)
Potassium: 4.4 mEq/L (ref 3.5–5.1)
Total Protein: 8.4 g/dL — ABNORMAL HIGH (ref 6.0–8.3)

## 2013-08-01 LAB — URINALYSIS, ROUTINE W REFLEX MICROSCOPIC
Glucose, UA: NEGATIVE mg/dL
Hgb urine dipstick: NEGATIVE
Specific Gravity, Urine: 1.025 (ref 1.005–1.030)
pH: 5 (ref 5.0–8.0)

## 2013-08-01 LAB — URINE MICROSCOPIC-ADD ON

## 2013-08-01 LAB — SURGICAL PCR SCREEN
MRSA, PCR: NEGATIVE
Staphylococcus aureus: NEGATIVE

## 2013-08-01 LAB — PROTIME-INR: Prothrombin Time: 12.2 seconds (ref 11.6–15.2)

## 2013-08-01 LAB — TYPE AND SCREEN: Antibody Screen: NEGATIVE

## 2013-08-01 LAB — CBC
HCT: 41.8 % (ref 39.0–52.0)
Hemoglobin: 14.6 g/dL (ref 13.0–17.0)
MCH: 27.5 pg (ref 26.0–34.0)
MCHC: 34.9 g/dL (ref 30.0–36.0)

## 2013-08-01 NOTE — Progress Notes (Signed)
08/01/13 1532  OBSTRUCTIVE SLEEP APNEA  Have you ever been diagnosed with sleep apnea through a sleep study? No  Do you snore loudly (loud enough to be heard through closed doors)?  1  Do you often feel tired, fatigued, or sleepy during the daytime? 1  Has anyone observed you stop breathing during your sleep? 0  Do you have, or are you being treated for high blood pressure? 0  BMI more than 35 kg/m2? 0  Age over 63 years old? 1  Neck circumference greater than 40 cm/18 inches? 0  Gender: 1  Obstructive Sleep Apnea Score 4  Score 4 or greater  Results sent to PCP

## 2013-08-01 NOTE — Progress Notes (Signed)
Pt understands some [place], wife and daughter are with patient.  Pt expressed that he usually has an interpreter.  I  Called the language line and was told that they do not have an interpreter for [place].  Daughter said that patient understands a lot, just need to use simple terms. I was able to complete interview- pt said he felt comfortable.  I will have interpreter read the consent to patient on the day of surgery.

## 2013-08-01 NOTE — Pre-Procedure Instructions (Signed)
Shjon Lizarraga  08/01/2013   Your procedure is scheduled on:  Friday, August 8th.  Report to [facility] at 5:30 AM.  Call this number if you have problems the morning of surgery: [phone]   Remember:   Do not eat food or drink liquids after midnight.   Take these medicines the morning of surgery with A SIP OF WATER: None   Do not wear jewelry, make-up or nail polish.  Do not wear lotions, powders, or perfumes. You may wear deodorant.  Do not shave 48 hours prior to surgery.   Do not bring valuables to the hospital.  [facility] is not responsible for any belongings or valuables.  Contacts, dentures or bridgework may not be worn into surgery.  Leave suitcase in the car. After surgery it may be brought to your room.  For patients admitted to the hospital, checkout time is 11:00 AM the day of discharge.   Patients discharged the day of surgery will not be allowed to drive home.  Name and phone number of your driver:    Special Instructions: Shower using CHG 2 nights before surgery and the night before surgery.  If you shower the day of surgery use CHG.  Use special wash - you have one bottle of CHG for all showers.  You should use approximately 1/3 of the bottle for each shower.   Please read over the following fact sheets that you were given: Pain Booklet, Coughing and Deep Breathing, Blood Transfusion Information and Surgical Site Infection Prevention

## 2013-08-01 NOTE — Progress Notes (Signed)
The patient presents today for continued discussion of his severe bilateral carotid stenosis. He was seen by Dr. fields while hospitalized with a left brain stroke. Workup included carotid duplex and CT angiogram showing bilateral carotid stenoses. He is here today with his family and a [place] interpreter. He has had minimal improvement in his right hand weakness. He does walk without difficulty and has not had any speech difficulty. He did have prior cervical disc surgery with an incision on his low anterior left neck  Past Medical History  Diagnosis Date  . Stroke     History  Substance Use Topics  . Smoking status: Former Smoker    Quit date: 12/28/2005  . Smokeless tobacco: Not on file  . Alcohol Use: No     Comment: a few drinks occasionally     History reviewed. No pertinent family history.  No Known Allergies  Current outpatient prescriptions:aspirin EC 81 MG EC tablet, Take 1 tablet (81 mg total) by mouth daily., Disp: 30 tablet, Rfl: 1;  atorvastatin (LIPITOR) 80 MG tablet, Take 1 tablet (80 mg total) by mouth daily., Disp: 30 tablet, Rfl: 1  BP 130/110  Pulse 69  Resp 16  Ht 5' 4" (1.626 m)  Wt 156 lb 11.2 oz (71.079 kg)  BMI 26.88 kg/m2  Body mass index is 26.88 kg/(m^2).       Physical exam: Marked weakness with minimal function in his right hand. He is alert and oriented in no acute distress I do not hear carotid bruits bilaterally Pulse status 2+ radial pulses bilaterally Heart regular rate and rhythm without murmur Abdomen soft nontender with no masses noted  I reviewed his CT angiogram and discussed this with the patient and his family present. I have recommended staged bilateral carotid endarterectomies. He does have a severe stenoses it is symptomatic on the left and asymptomatic on the right. He understands the procedure including expected one-day hospitalization and slight risk of stroke with the procedure. We will schedule his surgery for  08/04/2013 

## 2013-08-02 NOTE — Progress Notes (Signed)
Anesthesia chart review:  Patient is a 62 year old male scheduled for left CEA by Dr. Arbie Cookey on 08/04/13.  He will ultimately require right CEA as well.  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  [place] is [place]  He is recently established with [facility] for primary care, last with Dr. Glendell Docker on 07/27/13.    He was seen at [facility] on 07/30/13 for a preoperative cardiology evaluation.  He had a recent stress and echo, and thus his perioperative CV risk was felt to be low.  Nuclear stress test on 07/25/13 showed: Normal stress nuclear study. LV Ejection Fraction: 51%. LV Wall Motion: Normal Wall Motion.  2D echo on 07/17/13 showed: Left ventricle: The cavity size was normal. Wall thickness was increased in a pattern of mild LVH. Systolic function was normal. The estimated ejection fraction was in the range of 50% to 55%. Wall motion was normal; there were no regional wall motion abnormalities. Doppler parameters are consistent with abnormal left ventricular relaxation (grade 1 diastolic dysfunction). Trivial MR/PRTR.  He subsequently had a TEE on 07/20/13 that did not show evidence of an ASD/PFO.  No thrombus in the left or right atrium.  Carotid duplex on 07/17/13 showed 60-79% ICA stenosis bilaterally, antegrade vertebral flow.  CXR on 08/01/13 showed: Bronchitic changes with bibasilar atelectasis. Minimal enlargement cardiac silhouette.   Preoperative labs noted.  Cr 1.36, non-fasting glucose 210. His previous glucose reading were 144-165 in July 2014. He has no documented history of known diabetes, and A1C was 6.4 on 07/11/13.  AST/ALT are mildly elevated, but less than two times normal.  WBC 11.6.  PT/PTT WNL.  With an elevated non-fasting glucose, I think he will need a CBG on arrival.  His recent A1C is < 6.5, but he will need continued out-patient follow-up and  consideration of treatment if further increase.  (CMET results routed to Dr. Arbie Cookey and Dr. Glendell Docker.)  Shonna Chock, PA-C [facility]/Anesthesiology Phone [phone] 08/02/2013 11:34 AM

## 2013-08-03 ENCOUNTER — Ambulatory Visit: Payer: Self-pay

## 2013-08-03 MED ORDER — DEXTROSE 5 % IV SOLN
1.5000 g | INTRAVENOUS | Status: AC
  Administered 2013-08-04: 1.5 g via INTRAVENOUS
  Filled 2013-08-03: qty 1.5

## 2013-08-04 ENCOUNTER — Encounter (HOSPITAL_COMMUNITY)
Admission: RE | Disposition: A | Discharge status: Home / Self Care | Payer: Self-pay | Source: Ambulatory Visit | Attending: Vascular Surgery

## 2013-08-04 ENCOUNTER — Telehealth: Payer: Self-pay | Admitting: Vascular Surgery

## 2013-08-04 ENCOUNTER — Encounter (HOSPITAL_COMMUNITY): Payer: Self-pay | Admitting: Anesthesiology

## 2013-08-04 ENCOUNTER — Encounter (HOSPITAL_COMMUNITY): Payer: Self-pay | Admitting: Vascular Surgery

## 2013-08-04 ENCOUNTER — Ambulatory Visit (HOSPITAL_COMMUNITY): Payer: Medicaid Other | Admitting: Anesthesiology

## 2013-08-04 ENCOUNTER — Inpatient Hospital Stay (HOSPITAL_COMMUNITY)
Admission: RE | Admit: 2013-08-04 | Discharge: 2013-08-06 | DRG: 039 | Disposition: A | Discharge status: Home / Self Care | Payer: Medicaid Other | Source: Ambulatory Visit | Attending: Vascular Surgery | Admitting: Vascular Surgery

## 2013-08-04 DIAGNOSIS — R29898 Other symptoms and signs involving the musculoskeletal system: Secondary | ICD-10-CM | POA: Diagnosis present

## 2013-08-04 DIAGNOSIS — Z87891 Personal history of nicotine dependence: Secondary | ICD-10-CM

## 2013-08-04 DIAGNOSIS — I6529 Occlusion and stenosis of unspecified carotid artery: Secondary | ICD-10-CM

## 2013-08-04 DIAGNOSIS — E785 Hyperlipidemia, unspecified: Secondary | ICD-10-CM | POA: Diagnosis present

## 2013-08-04 DIAGNOSIS — I63239 Cerebral infarction due to unspecified occlusion or stenosis of unspecified carotid arteries: Secondary | ICD-10-CM

## 2013-08-04 DIAGNOSIS — I69998 Other sequelae following unspecified cerebrovascular disease: Secondary | ICD-10-CM

## 2013-08-04 DIAGNOSIS — I69992 Facial weakness following unspecified cerebrovascular disease: Secondary | ICD-10-CM

## 2013-08-04 DIAGNOSIS — Z7982 Long term (current) use of aspirin: Secondary | ICD-10-CM

## 2013-08-04 HISTORY — PX: CAROTID ENDARTERECTOMY: SUR193

## 2013-08-04 HISTORY — PX: ENDARTERECTOMY: SHX5162

## 2013-08-04 LAB — GLUCOSE, CAPILLARY

## 2013-08-04 SURGERY — ENDARTERECTOMY, CAROTID
Anesthesia: General | Site: Neck | Laterality: Left | Wound class: Clean

## 2013-08-04 MED ORDER — ACETAMINOPHEN 650 MG RE SUPP: 325.0000 mg | RECTAL | Status: DC | PRN

## 2013-08-04 MED ORDER — DOCUSATE SODIUM 100 MG PO CAPS
100.0000 mg | ORAL_CAPSULE | Freq: Every day | ORAL | Status: DC
  Administered 2013-08-05 – 2013-08-06 (×2): 100 mg via ORAL
  Filled 2013-08-04 (×2): qty 1

## 2013-08-04 MED ORDER — GLYCOPYRROLATE 0.2 MG/ML IJ SOLN
INTRAMUSCULAR | Status: DC | PRN
  Administered 2013-08-04: .8 mg via INTRAVENOUS
  Administered 2013-08-04: .2 mg via INTRAVENOUS

## 2013-08-04 MED ORDER — ATORVASTATIN CALCIUM 80 MG PO TABS
80.0000 mg | ORAL_TABLET | Freq: Every day | ORAL | Status: DC
  Administered 2013-08-05 – 2013-08-06 (×2): 80 mg via ORAL
  Filled 2013-08-04 (×2): qty 1

## 2013-08-04 MED ORDER — PHENOL 1.4 % MT LIQD
1.0000 | OROMUCOSAL | Status: DC | PRN
  Administered 2013-08-04: 1 via OROMUCOSAL
  Filled 2013-08-04: qty 177

## 2013-08-04 MED ORDER — ALUM & MAG HYDROXIDE-SIMETH 200-200-20 MG/5ML PO SUSP: 15.0000 mL | ORAL | Status: DC | PRN

## 2013-08-04 MED ORDER — HYDRALAZINE HCL 20 MG/ML IJ SOLN: 10.0000 mg | INTRAMUSCULAR | Status: DC | PRN

## 2013-08-04 MED ORDER — PROPOFOL 10 MG/ML IV BOLUS
INTRAVENOUS | Status: DC | PRN
  Administered 2013-08-04: 120 mg via INTRAVENOUS
  Administered 2013-08-04: 30 mg via INTRAVENOUS

## 2013-08-04 MED ORDER — DEXTROSE 5 % IV SOLN
1.5000 g | Freq: Two times a day (BID) | INTRAVENOUS | Status: AC
  Administered 2013-08-04 – 2013-08-05 (×2): 1.5 g via INTRAVENOUS
  Filled 2013-08-04 (×2): qty 1.5

## 2013-08-04 MED ORDER — LIDOCAINE HCL (PF) 1 % IJ SOLN
INTRAMUSCULAR | Status: AC
  Filled 2013-08-04: qty 30

## 2013-08-04 MED ORDER — SODIUM CHLORIDE 0.9 % IR SOLN
Status: DC | PRN
  Administered 2013-08-04: 08:00:00

## 2013-08-04 MED ORDER — ROCURONIUM BROMIDE 100 MG/10ML IV SOLN
INTRAVENOUS | Status: DC | PRN
  Administered 2013-08-04: 50 mg via INTRAVENOUS

## 2013-08-04 MED ORDER — METOPROLOL TARTRATE 1 MG/ML IV SOLN: 2.0000 mg | INTRAVENOUS | Status: DC | PRN

## 2013-08-04 MED ORDER — OXYCODONE HCL 5 MG/5ML PO SOLN: 5.0000 mg | Freq: Once | ORAL | Status: DC | PRN

## 2013-08-04 MED ORDER — LABETALOL HCL 5 MG/ML IV SOLN: 10.0000 mg | INTRAVENOUS | Status: DC | PRN

## 2013-08-04 MED ORDER — PROTAMINE SULFATE 10 MG/ML IV SOLN
INTRAVENOUS | Status: DC | PRN
  Administered 2013-08-04 (×5): 10 mg via INTRAVENOUS

## 2013-08-04 MED ORDER — POTASSIUM CHLORIDE CRYS ER 20 MEQ PO TBCR: 20.0000 meq | EXTENDED_RELEASE_TABLET | Freq: Once | ORAL | Status: AC | PRN

## 2013-08-04 MED ORDER — ACETAMINOPHEN 325 MG PO TABS: 325.0000 mg | ORAL_TABLET | ORAL | Status: DC | PRN

## 2013-08-04 MED ORDER — NEOSTIGMINE METHYLSULFATE 1 MG/ML IJ SOLN
INTRAMUSCULAR | Status: DC | PRN
  Administered 2013-08-04: 4 mg via INTRAVENOUS

## 2013-08-04 MED ORDER — PROMETHAZINE HCL 25 MG/ML IJ SOLN: 6.2500 mg | INTRAMUSCULAR | Status: DC | PRN

## 2013-08-04 MED ORDER — DEXTROSE-NACL 5-0.9 % IV SOLN
INTRAVENOUS | Status: DC
  Administered 2013-08-04: 11:00:00 via INTRAVENOUS

## 2013-08-04 MED ORDER — LACTATED RINGERS IV SOLN
INTRAVENOUS | Status: DC | PRN
  Administered 2013-08-04 (×2): via INTRAVENOUS

## 2013-08-04 MED ORDER — 0.9 % SODIUM CHLORIDE (POUR BTL) OPTIME
TOPICAL | Status: DC | PRN
  Administered 2013-08-04: 1000 mL

## 2013-08-04 MED ORDER — OXYCODONE-ACETAMINOPHEN 5-325 MG PO TABS: 1.0000 | ORAL_TABLET | ORAL | Status: DC | PRN

## 2013-08-04 MED ORDER — SODIUM CHLORIDE 0.9 % IV SOLN: 500.0000 mL | Freq: Once | INTRAVENOUS | Status: AC | PRN

## 2013-08-04 MED ORDER — SODIUM CHLORIDE 0.9 % IV SOLN
20.0000 mg | INTRAVENOUS | Status: DC | PRN
  Administered 2013-08-04: 10 ug/min via INTRAVENOUS

## 2013-08-04 MED ORDER — ONDANSETRON HCL 4 MG/2ML IJ SOLN
4.0000 mg | Freq: Four times a day (QID) | INTRAMUSCULAR | Status: DC | PRN
  Administered 2013-08-04: 4 mg via INTRAVENOUS
  Filled 2013-08-04: qty 2

## 2013-08-04 MED ORDER — OXYCODONE-ACETAMINOPHEN 5-325 MG PO TABS
1.0000 | ORAL_TABLET | ORAL | Status: DC | PRN
  Administered 2013-08-05 (×4): 1 via ORAL
  Filled 2013-08-04 (×4): qty 1

## 2013-08-04 MED ORDER — PANTOPRAZOLE SODIUM 40 MG PO TBEC
40.0000 mg | DELAYED_RELEASE_TABLET | Freq: Every day | ORAL | Status: DC
  Administered 2013-08-05 – 2013-08-06 (×2): 40 mg via ORAL
  Filled 2013-08-04 (×2): qty 1

## 2013-08-04 MED ORDER — LIDOCAINE HCL 4 % MT SOLN
OROMUCOSAL | Status: DC | PRN
  Administered 2013-08-04: 4 mL via TOPICAL

## 2013-08-04 MED ORDER — FENTANYL CITRATE 0.05 MG/ML IJ SOLN
INTRAMUSCULAR | Status: DC | PRN
  Administered 2013-08-04: 250 ug via INTRAVENOUS

## 2013-08-04 MED ORDER — LIDOCAINE HCL (CARDIAC) 20 MG/ML IV SOLN
INTRAVENOUS | Status: DC | PRN
  Administered 2013-08-04: 30 mg via INTRAVENOUS

## 2013-08-04 MED ORDER — MORPHINE SULFATE 2 MG/ML IJ SOLN
2.0000 mg | INTRAMUSCULAR | Status: DC | PRN
  Administered 2013-08-04 (×3): 2 mg via INTRAVENOUS
  Filled 2013-08-04 (×3): qty 1

## 2013-08-04 MED ORDER — ARTIFICIAL TEARS OP OINT
TOPICAL_OINTMENT | OPHTHALMIC | Status: DC | PRN
  Administered 2013-08-04: 1 via OPHTHALMIC

## 2013-08-04 MED ORDER — ONDANSETRON HCL 4 MG/2ML IJ SOLN
INTRAMUSCULAR | Status: DC | PRN
  Administered 2013-08-04: 4 mg via INTRAVENOUS

## 2013-08-04 MED ORDER — GUAIFENESIN-DM 100-10 MG/5ML PO SYRP: 15.0000 mL | ORAL_SOLUTION | ORAL | Status: DC | PRN

## 2013-08-04 MED ORDER — FENTANYL CITRATE 0.05 MG/ML IJ SOLN: 25.0000 ug | INTRAMUSCULAR | Status: DC | PRN

## 2013-08-04 MED ORDER — MEPERIDINE HCL 25 MG/ML IJ SOLN: 6.2500 mg | INTRAMUSCULAR | Status: DC | PRN

## 2013-08-04 MED ORDER — MIDAZOLAM HCL 2 MG/2ML IJ SOLN: 0.5000 mg | Freq: Once | INTRAMUSCULAR | Status: DC | PRN

## 2013-08-04 MED ORDER — HEPARIN SODIUM (PORCINE) 1000 UNIT/ML IJ SOLN
INTRAMUSCULAR | Status: DC | PRN
  Administered 2013-08-04: 7000 [IU] via INTRAVENOUS

## 2013-08-04 MED ORDER — OXYCODONE HCL 5 MG PO TABS: 5.0000 mg | ORAL_TABLET | Freq: Once | ORAL | Status: DC | PRN

## 2013-08-04 MED ORDER — SODIUM CHLORIDE 0.9 % IV SOLN: INTRAVENOUS | Status: DC

## 2013-08-04 MED ORDER — ASPIRIN EC 81 MG PO TBEC
81.0000 mg | DELAYED_RELEASE_TABLET | Freq: Every day | ORAL | Status: DC
  Administered 2013-08-05 – 2013-08-06 (×2): 81 mg via ORAL
  Filled 2013-08-04 (×2): qty 1

## 2013-08-04 SURGICAL SUPPLY — 51 items
BENZOIN TINCTURE PRP APPL 2/3 (GAUZE/BANDAGES/DRESSINGS) ×2 IMPLANT
CANISTER SUCTION 2500CC (MISCELLANEOUS) ×2 IMPLANT
CATH ROBINSON RED A/P 18FR (CATHETERS) ×2 IMPLANT
CLIP LIGATING EXTRA MED SLVR (CLIP) ×2 IMPLANT
CLIP LIGATING EXTRA SM BLUE (MISCELLANEOUS) ×2 IMPLANT
CLOTH BEACON ORANGE TIMEOUT ST (SAFETY) ×2 IMPLANT
CLSR STERI-STRIP ANTIMIC 1/2X4 (GAUZE/BANDAGES/DRESSINGS) ×2 IMPLANT
COVER SURGICAL LIGHT HANDLE (MISCELLANEOUS) ×2 IMPLANT
CRADLE DONUT ADULT HEAD (MISCELLANEOUS) ×2 IMPLANT
DECANTER SPIKE VIAL GLASS SM (MISCELLANEOUS) IMPLANT
DRAIN HEMOVAC 1/8 X 5 (WOUND CARE) IMPLANT
DRAPE WARM FLUID 44X44 (DRAPE) ×2 IMPLANT
DRSG COVADERM 4X8 (GAUZE/BANDAGES/DRESSINGS) ×2 IMPLANT
ELECT REM PT RETURN 9FT ADLT (ELECTROSURGICAL) ×2
ELECTRODE REM PT RTRN 9FT ADLT (ELECTROSURGICAL) ×1 IMPLANT
EVACUATOR SILICONE 100CC (DRAIN) IMPLANT
GEL ULTRASOUND 20GR AQUASONIC (MISCELLANEOUS) IMPLANT
GLOVE BIO SURGEON STRL SZ 6 (GLOVE) ×2 IMPLANT
GLOVE BIOGEL PI IND STRL 6.5 (GLOVE) ×2 IMPLANT
GLOVE BIOGEL PI IND STRL 7.0 (GLOVE) ×1 IMPLANT
GLOVE BIOGEL PI IND STRL 7.5 (GLOVE) ×1 IMPLANT
GLOVE BIOGEL PI INDICATOR 6.5 (GLOVE) ×2
GLOVE BIOGEL PI INDICATOR 7.0 (GLOVE) ×1
GLOVE BIOGEL PI INDICATOR 7.5 (GLOVE) ×1
GLOVE ECLIPSE 6.0 STRL STRAW (GLOVE) ×2 IMPLANT
GLOVE SS BIOGEL STRL SZ 6.5 (GLOVE) ×1 IMPLANT
GLOVE SS BIOGEL STRL SZ 7.5 (GLOVE) ×1 IMPLANT
GLOVE SUPERSENSE BIOGEL SZ 6.5 (GLOVE) ×1
GLOVE SUPERSENSE BIOGEL SZ 7.5 (GLOVE) ×1
GOWN STRL NON-REIN LRG LVL3 (GOWN DISPOSABLE) IMPLANT
KIT BASIN OR (CUSTOM PROCEDURE TRAY) ×2 IMPLANT
KIT ROOM TURNOVER OR (KITS) ×2 IMPLANT
NEEDLE 22X1 1/2 (OR ONLY) (NEEDLE) IMPLANT
NS IRRIG 1000ML POUR BTL (IV SOLUTION) ×4 IMPLANT
PACK CAROTID (CUSTOM PROCEDURE TRAY) ×2 IMPLANT
PAD ARMBOARD 7.5X6 YLW CONV (MISCELLANEOUS) ×4 IMPLANT
PATCH HEMASHIELD 8X75 (Vascular Products) ×2 IMPLANT
SHUNT CAROTID BYPASS 10 (VASCULAR PRODUCTS) ×2 IMPLANT
SHUNT CAROTID BYPASS 12FRX15.5 (VASCULAR PRODUCTS) IMPLANT
STRIP CLOSURE SKIN 1/2X4 (GAUZE/BANDAGES/DRESSINGS) ×2 IMPLANT
SUT ETHILON 3 0 PS 1 (SUTURE) IMPLANT
SUT PROLENE 6 0 CC (SUTURE) ×4 IMPLANT
SUT SILK 3 0 (SUTURE)
SUT SILK 3-0 18XBRD TIE 12 (SUTURE) IMPLANT
SUT VIC AB 3-0 SH 27 (SUTURE) ×2
SUT VIC AB 3-0 SH 27X BRD (SUTURE) ×2 IMPLANT
SUT VICRYL 4-0 PS2 18IN ABS (SUTURE) ×2 IMPLANT
SYR CONTROL 10ML LL (SYRINGE) IMPLANT
TOWEL OR 17X24 6PK STRL BLUE (TOWEL DISPOSABLE) ×2 IMPLANT
TOWEL OR 17X26 10 PK STRL BLUE (TOWEL DISPOSABLE) ×2 IMPLANT
WATER STERILE IRR 1000ML POUR (IV SOLUTION) ×2 IMPLANT

## 2013-08-04 NOTE — Op Note (Signed)
[facility]  Patient name: Juan [name] MRN: [id number] DOB: 09/10/1951 Sex: male  08/04/2013 Pre-operative Diagnosis: Symptomatic left carotid stenosis Post-operative diagnosis:  Same Surgeon:  Larina Earthly, M.D. Assistants:  [name] Procedure:    left carotid Endarterectomy with Dacron patch angioplasty Anesthesia:  General Blood Loss:  See anesthesia record Specimens:  Carotid Plaque to pathology  Indications for surgery:  Left brain stroke  Procedure in detail:  The patient was taken to the operating and placed in the supine position. The neck was prepped and draped in the usual sterile fashion. An incision was made anterior to the sternocleidomastoid muscle and continued with electrocautery through the platysma muscle. The muscle was retracted posteriorly and the carotid sheath was opened. The facial vein was ligated with 2-0 silk ties and divided. The common carotid artery was encircled with an umbilical tape and Rummel tourniquet. Dissection was continued onto the carotid bifurcation. The superior thyroid artery was controlled with a 2-0 silk Potts tie. The external carotid organ was encircled with a vessel loop and the internal carotid was encircled with umbilical tape and Rummel tourniquet. The hypoglossal and vagus nerves were identified and preserved.  The patient was given systemic heparinization. After adequate circulation time, the internal,external and common carotid arteries were occluded. The common carotid was opened with an 11 blade and the arteriotomy was continued with Potts scissors onto the internal carotid artery. A 10 shunt was passed up the internal carotid artery, allowed to back bleed, and then passed down the common carotid artery. The shunt was secured with Rummel tourniquet. The endarterectomy was begun on the common carotid artery  plaque was divided proximally with Potts scissors. The endarterectomy was continued onto the carotid  bifurcation. The external carotid was endarterectomized by eversion technique and the internal carotid artery was endarterectomized in an open fashion. Remaining debris was removed from the endarterectomy plane. A Dacron patch was brought to the field and sewn as a patch angioplasty. Prior to completing the anastomosis, the shunt was removed and the usual flushing maneuvers were undertaken. The anastomosis was then completed and flow was restored first to the external and then the internal carotid artery. Excellent flow characteristics were noted with hand-held Doppler in the internal and external carotid arteries.  The patient was given protamine to reverse the heparin. Hemostasis was obtained with electrocautery. The wounds were irrigated with saline. The wound was closed by first reapproximating the sternocleidomastoid muscle over the carotid artery with interrupted 3-0 Vicryl sutures. Next, the platysma was closed with a running 3-0 Vicryl suture. The skin was closed with a 4-0 subcuticular Vicryl suture. Benzoin and Steri-Strips were applied to the incision. A sterile dressing was placed over the incision. All sponge and needle counts were correct. The patient was awakened in the operating room, neurologically intact. They were transferred to the PACU in stable condition.  Carotid stenosis at surgery:>80%  Disposition:  To PACU in stable condition,neurologically intact  Relevant Operative Details:  Focal bifurcation stenosis  Larina Earthly, M.D. [facility] Office: [phone] Pager:  [phone]

## 2013-08-04 NOTE — Transfer of Care (Signed)
Immediate Anesthesia Transfer of Care Note  Patient: Juan [name]  Procedure(s) Performed: Procedure(s): Left Carotid Endarterectomy with hemashield patch angioplasty (Left)  Patient Location: PACU  Anesthesia Type:General  Level of Consciousness: awake, alert  and oriented  Airway & Oxygen Therapy: Patient Spontanous Breathing and Patient connected to nasal cannula oxygen  Post-op Assessment: Report given to PACU RN, Patient moving all extremities, Patient moving all extremities X 4 and Patient able to stick tongue midline  Post vital signs: Reviewed and stable  Complications: No apparent anesthesia complications

## 2013-08-04 NOTE — Anesthesia Preprocedure Evaluation (Addendum)
Anesthesia Evaluation  Patient identified by MRN, date of birth, ID band Patient awake    Reviewed: Allergy & Precautions, H&P , NPO status , Patient's Chart, lab work & pertinent test results, reviewed documented beta blocker date and time   History of Anesthesia Complications Negative for: history of anesthetic complications  Airway Mallampati: II TM Distance: >3 FB Neck ROM: full    Dental  (+) Teeth Intact and Dental Advidsory Given   Pulmonary former smoker (quit '07),  breath sounds clear to auscultation  Pulmonary exam normal       Cardiovascular hypertension, Pt. on medications + Peripheral Vascular Disease Rhythm:Regular Rate:Normal  7/14 ECHO: EF 60-65%, valves OK   Neuro/Psych CVA (R arm weakness), Residual Symptoms    GI/Hepatic Neg liver ROS, Elevated LFT's   Endo/Other  diabetes (glu 131, ? borderline diabetic)  Renal/GU Creat 1.36     Musculoskeletal   Abdominal   Peds  Hematology   Anesthesia Other Findings Stroke with right sided weakness. Spoke through interpretor to patient regarding history and npo status.  Reproductive/Obstetrics                         Anesthesia Physical Anesthesia Plan  ASA: III  Anesthesia Plan: General   Post-op Pain Management:    Induction: Intravenous  Airway Management Planned: Oral ETT  Additional Equipment: Arterial line  Intra-op Plan:   Post-operative Plan: Extubation in OR  Informed Consent: I have reviewed the patients History and Physical, chart, labs and discussed the procedure including the risks, benefits and alternatives for the proposed anesthesia with the patient or authorized representative who has indicated his/her understanding and acceptance.   Dental Advisory Given and Dental advisory given  Plan Discussed with: Anesthesiologist, CRNA and Surgeon  Anesthesia Plan Comments: (Plan routine monitors, A line, GETA)        Anesthesia Quick Evaluation

## 2013-08-04 NOTE — Telephone Encounter (Signed)
lvm re appt, sent letter - kf

## 2013-08-04 NOTE — Interval H&P Note (Signed)
History and Physical Interval Note:  08/04/2013 7:14 AM  Y Juan [name]  has presented today for surgery, with the diagnosis of Left Internal Carotid Artery Stenosis   The various methods of treatment have been discussed with the patient and family. After consideration of risks, benefits and other options for treatment, the patient has consented to  Procedure(s): ENDARTERECTOMY CAROTID - LEFT (Left) as a surgical intervention .  The patient's history has been reviewed, patient examined, no change in status, stable for surgery.  I have reviewed the patient's chart and labs.  Questions were answered to the patient's satisfaction.     Cashe Gatt

## 2013-08-04 NOTE — OR Nursing (Signed)
Neuro checks the same as preop.

## 2013-08-04 NOTE — Telephone Encounter (Signed)
Message copied by Margaretmary Eddy on Fri Aug 04, 2013 11:01 AM ------      Message from: Melene Plan      Created: Fri Aug 04, 2013  9:17 AM                   ----- Message -----         From: Marlowe Shores, PA-C         Sent: 08/04/2013   9:12 AM           To: Melene Plan, RN, [facility] Admin Pool            2 week F/U CEA -Early ------

## 2013-08-04 NOTE — Discharge Summary (Signed)
[facility] Discharge Summary   Patient ID:  Juan [name] MRN: [id number] DOB/AGE: March 04, 1951 62 y.o.  Admit date: 08/04/2013 Discharge date: 08/06/13 Date of Surgery: 08/04/2013 Surgeon: Surgeon(s): Larina Earthly, MD  Admission Diagnosis: Left Internal Carotid Artery Stenosis   Discharge Diagnoses:  Left Internal Carotid Artery Stenosis   Secondary Diagnoses: Past Medical History  Diagnosis Date  . Hyperlipemia   . Stroke 06/2013    Procedure(s): Left Carotid Endarterectomy with hemashield patch angioplasty  Discharged Condition: good  HPI:  Juan [name] is a 62 y.o. male who was seen by Dr. Arbie Cookey for continued discussion of his severe bilateral carotid stenosis. He was seen by Dr. Darrick Penna while hospitalized with a left brain stroke. Workup included carotid duplex and CT angiogram showing bilateral carotid stenoses. He is here today with his family and a [place] interpreter. He has had minimal improvement in his right hand weakness. He does walk without difficulty and has not had any speech difficulty. He did have prior cervical disc surgery with an incision on his low anterior left neck It was recommended staged bilateral carotid endarterectomies. He does have a severe stenoses it is symptomatic on the left and asymptomatic on the right. He understands the procedure including expected one-day hospitalization and slight risk of stroke with the procedure. We will schedule his surgery for 08/04/2013  Hospital Course:  Brysan Mcevoy is a 62 y.o. male is S/P Left Carotid Endarterectomy with hemashield patch angioplasty Extubated: POD # 0 Physical exam: Neuro exam stable with marked weakness in the right hand. Able to lift it and has weak grip. RLE, LLE,LUE intact No C/O HA Post-op wounds healing well Pt. Ambulating, voiding and taking PO diet without difficulty. Pt pain controlled with PO pain meds. Labs as below Complications:none  Consults:     Significant  Diagnostic Studies: CBC Lab Results  Component Value Date   WBC 11.6* 08/01/2013   HGB 14.6 08/01/2013   HCT 41.8 08/01/2013   MCV 78.7 08/01/2013   PLT 291 08/01/2013    BMET    Component Value Date/Time   NA 133* 08/01/2013 1610   K 4.4 08/01/2013 1610   CL 95* 08/01/2013 1610   CO2 27 08/01/2013 1610   GLUCOSE 210* 08/01/2013 1610   BUN 17 08/01/2013 1610   CREATININE 1.36* 08/01/2013 1610   CALCIUM 9.8 08/01/2013 1610   GFRNONAA 54* 08/01/2013 1610   GFRAA 62* 08/01/2013 1610   COAG Lab Results  Component Value Date   INR 0.92 08/01/2013   INR 0.92 07/16/2013     Disposition:  Discharge to :Home  Future Appointments Provider Department Dept Phone   08/11/2013 2:30 PM [employer] Counselor [facility] [phone]   08/21/2013 1:45 PM Boykin Peek, MD [facility] [phone]       Medication List         aspirin 81 MG EC tablet  Take 1 tablet (81 mg total) by mouth daily.     atorvastatin 80 MG tablet  Commonly known as:  LIPITOR  Take 1 tablet (80 mg total) by mouth daily.     oxyCODONE-acetaminophen 5-325 MG per tablet  Commonly known as:  ROXICET  Take 1 tablet by mouth every 4 (four) hours as needed for pain.       Verbal and written Discharge instructions given to the patient. Wound care per Discharge AVS     Follow-up Information   Follow up with EARLY,  TODD, MD In 2 weeks. (office will arrange-sent)    Contact information:   [address] [place] [address] [phone]       Signed: Marlowe Shores 08/04/2013, 9:15 AM    --- For VQI Registry use --- Instructions: Press F2 to tab through selections.  Delete question if not applicable.   Modified Rankin score at D/C (0-6): Rankin Score=4  IV medication needed for:  1. Hypertension: No 2. Hypotension: No  Post-op Complications: No  1. Post-op CVA or TIA: No  2. CN injury: Yes - pre-op RUE weakness -unchanged post-op   3. Myocardial infarction:  No    4.  CHF: No  5.  Dysrhythmia (new): No  6. Wound infection: No  7. Reperfusion symptoms: No  8. Return to OR: No   Discharge medications: Statin use:  Yes ASA use:  Yes Beta blocker use:  no ACE-Inhibitor use: no P2Y12 Antagonist use: [x ] None, [ ]  Plavix, [ ]  Plasugrel, [ ]  Ticlopinine, [ ]  Ticagrelor, [ ]  Other, [ ]  No for medical reason, [ ]  Non-compliant, [ ]  Not-indicated Anti-coagulant use:  [x ] None, [ ]  Warfarin, [ ]  Rivaroxaban, [ ]  Dabigatran, [ ]  Other, [ ]  No for medical reason, [ ]  Non-compliant, [ ]  Not-indicated

## 2013-08-04 NOTE — OR Nursing (Signed)
Patient has weakness on right side with arm. Hand very weak able to lift arm from shoulder. Good grip on left. Moves legs and walks without assistance. Astrid Drafts RNFA

## 2013-08-04 NOTE — Anesthesia Postprocedure Evaluation (Signed)
  Anesthesia Post-op Note  Patient: Juan [name]  Procedure(s) Performed: Procedure(s): Left Carotid Endarterectomy with hemashield patch angioplasty (Left)  Patient Location: PACU  Anesthesia Type:General  Level of Consciousness: awake  Airway and Oxygen Therapy: Patient Spontanous Breathing  Post-op Pain: mild  Post-op Assessment: Post-op Vital signs reviewed, Patient's Cardiovascular Status Stable, Respiratory Function Stable, Patent Airway, No signs of Nausea or vomiting and Pain level controlled  Post-op Vital Signs: stable  Complications: No apparent anesthesia complications

## 2013-08-04 NOTE — Progress Notes (Signed)
Utilization review completed.  

## 2013-08-04 NOTE — Anesthesia Procedure Notes (Signed)
Procedure Name: Intubation Date/Time: 08/04/2013 7:30 AM Performed by: Carmela Rima Pre-anesthesia Checklist: Timeout performed, Patient identified, Emergency Drugs available, Suction available and Patient being monitored Patient Re-evaluated:Patient Re-evaluated prior to inductionOxygen Delivery Method: Circle system utilized Preoxygenation: Pre-oxygenation with 100% oxygen Intubation Type: IV induction Ventilation: Mask ventilation without difficulty Laryngoscope Size: Mac and 3 Grade View: Grade II Tube type: Oral Number of attempts: 1 Placement Confirmation: ETT inserted through vocal cords under direct vision,  positive ETCO2 and breath sounds checked- equal and bilateral Secured at: 23 cm Tube secured with: Tape Dental Injury: Teeth and Oropharynx as per pre-operative assessment

## 2013-08-04 NOTE — Interval H&P Note (Signed)
History and Physical Interval Note:  08/04/2013 7:15 AM  Juan [name]  has presented today for surgery, with the diagnosis of Left Internal Carotid Artery Stenosis   The various methods of treatment have been discussed with the patient and family. After consideration of risks, benefits and other options for treatment, the patient has consented to  Procedure(s): ENDARTERECTOMY CAROTID - LEFT (Left) as a surgical intervention .  The patient's history has been reviewed, patient examined, no change in status, stable for surgery.  I have reviewed the patient's chart and labs.  Questions were answered to the patient's satisfaction.     [name]

## 2013-08-04 NOTE — Preoperative (Signed)
Beta Blockers   Reason not to administer Beta Blockers:Not Applicable 

## 2013-08-04 NOTE — H&P (View-Only) (Signed)
The patient presents today for continued discussion of his severe bilateral carotid stenosis. He was seen by Dr. Darrick Penna while hospitalized with a left brain stroke. Workup included carotid duplex and CT angiogram showing bilateral carotid stenoses. He is here today with his family and a [place] interpreter. He has had minimal improvement in his right hand weakness. He does walk without difficulty and has not had any speech difficulty. He did have prior cervical disc surgery with an incision on his low anterior left neck  Past Medical History  Diagnosis Date  . Stroke     History  Substance Use Topics  . Smoking status: Former Smoker    Quit date: 12/28/2005  . Smokeless tobacco: Not on file  . Alcohol Use: No     Comment: a few drinks occasionally     History reviewed. No pertinent family history.  No Known Allergies  Current outpatient prescriptions:aspirin EC 81 MG EC tablet, Take 1 tablet (81 mg total) by mouth daily., Disp: 30 tablet, Rfl: 1;  atorvastatin (LIPITOR) 80 MG tablet, Take 1 tablet (80 mg total) by mouth daily., Disp: 30 tablet, Rfl: 1  BP 130/110  Pulse 69  Resp 16  Ht 5\' 4"  (1.626 m)  Wt 156 lb 11.2 oz (71.079 kg)  BMI 26.88 kg/m2  Body mass index is 26.88 kg/(m^2).       Physical exam: Marked weakness with minimal function in his right hand. He is alert and oriented in no acute distress I do not hear carotid bruits bilaterally Pulse status 2+ radial pulses bilaterally Heart regular rate and rhythm without murmur Abdomen soft nontender with no masses noted  I reviewed his CT angiogram and discussed this with the patient and his family present. I have recommended staged bilateral carotid endarterectomies. He does have a severe stenoses it is symptomatic on the left and asymptomatic on the right. He understands the procedure including expected one-day hospitalization and slight risk of stroke with the procedure. We will schedule his surgery for  08/04/2013

## 2013-08-05 LAB — BASIC METABOLIC PANEL
BUN: 13 mg/dL (ref 6–23)
CO2: 27 mEq/L (ref 19–32)
Chloride: 99 mEq/L (ref 96–112)
GFR calc Af Amer: 89 mL/min — ABNORMAL LOW (ref >90)
Potassium: 3.9 mEq/L (ref 3.5–5.1)

## 2013-08-05 LAB — CBC
HCT: 37 % — ABNORMAL LOW (ref 39.0–52.0)
RBC: 4.7 MIL/uL (ref 4.22–5.81)
RDW: 13.5 % (ref 11.5–15.5)
WBC: 12.2 10*3/uL — ABNORMAL HIGH (ref 4.0–10.5)

## 2013-08-05 MED ORDER — OXYCODONE-ACETAMINOPHEN 5-325 MG PO TABS: 1.0000 | ORAL_TABLET | Freq: Four times a day (QID) | ORAL | Status: DC | PRN

## 2013-08-05 MED ORDER — OXYCODONE HCL 5 MG PO TABS
5.0000 mg | ORAL_TABLET | ORAL | Status: DC | PRN
  Administered 2013-08-05: 10 mg via ORAL
  Filled 2013-08-05: qty 2

## 2013-08-05 NOTE — Progress Notes (Addendum)
VASCULAR AND VEIN SURGERY POST - OP CEA NOTE  POD 1 Left carotid endarterectomy  Subjective: He has had a head ache since last night.  It did get a little better after pain medication was given.     Patient reports headache; denies difficulty swallowing; reports weakness; Secondary to CVA left prior to surgery.  Right upper extremity weakness and left facial droop.  denies new symptoms of stroke or TIA since surgery, at baseline from prior stroke.   Physical Exam: Pt is A&O x 3 Speech is fluent left Neck Wound is clean, dry, intact  Positive, Negative weakness right upper Negative tongue deviation Positive, Negative facial droop left with smile  Plan: Follow-up in 2 weeks with Carotid Duplex scan   The patient is status post left carotid endarterectomy, postop day 1. He is tolerating his breakfast this morning, although he does complain of a sore throat. The patient does have a reperfusion headache. He did not take any pain medicine overnight so I suspect this will improve with oral narcotics. If the patient continues to improve throughout the day, he'll be stable for discharge later this afternoon. The patient was seen and examined with his son who is at the bedside acting as Nurse, learning disability.  Juan [name]

## 2013-08-06 NOTE — Progress Notes (Addendum)
VASCULAR AND VEIN SURGERY POST - OP CEA PROGRESS NOTE  Date of Surgery: 08/04/2013  Surgeon(s): Larina Earthly, MD 2 Days Post-Op left CEA .  HPI: Juan [name] is a 62 Juan.o. male who is 2 Days Post-Op . Patient is doing well. Pre-operative symptoms are Unchanged Patient denies headache; Patient denies difficulty swallowing; Denies increased weakness in Right upper  extremity Pt. denies other symptoms of stroke or TIA.  IMAGING: None   Significant Diagnostic Studies: CBC Lab Results  Component Value Date   WBC 12.2* 08/05/2013   HGB 12.5* 08/05/2013   HCT 37.0* 08/05/2013   MCV 78.7 08/05/2013   PLT 275 08/05/2013    BMET    Component Value Date/Time   NA 136 08/05/2013 0458   K 3.9 08/05/2013 0458   CL 99 08/05/2013 0458   CO2 27 08/05/2013 0458   GLUCOSE 158* 08/05/2013 0458   BUN 13 08/05/2013 0458   CREATININE 1.01 08/05/2013 0458   CALCIUM 9.2 08/05/2013 0458   GFRNONAA 77* 08/05/2013 0458   GFRAA 89* 08/05/2013 0458    COAG Lab Results  Component Value Date   INR 0.92 08/01/2013   INR 0.92 07/16/2013   No results found for this basename: PTT      Intake/Output Summary (Last 24 hours) at 08/06/13 0847 Last data filed at 08/06/13 0402  Gross per 24 hour  Intake    720 ml  Output    475 ml  Net    245 ml    Physical Exam:  BP Readings from Last 3 Encounters:  08/06/13 147/76  08/06/13 147/76  08/01/13 116/76   Temp Readings from Last 3 Encounters:  08/06/13 98.1 F (36.7 C) Oral  08/06/13 98.1 F (36.7 C) Oral  08/01/13 98 F (36.7 C)    SpO2 Readings from Last 3 Encounters:  08/06/13 93%  08/06/13 93%  08/01/13 96%   Pulse Readings from Last 3 Encounters:  08/06/13 87  08/06/13 87  08/01/13 73    Pt is A&O x 3 Gait is normal Speech is fluent left Neck Wound is healing well Patient with Negative tongue deviation and Negative facial droop Pt has good and equal strength in all extremities except mod weakness RUE  Assessment/Plan:: Juan [name] is a 62 Juan.o. male  is S/P Left CEA Pt is voiding, ambulating and taking po well Pre-op RUE weakness unchanged - no new symptoms    Discharge to: Home Follow-up in 2 weeks   Juan [name]  08/06/2013 8:47 AM  I agree with the above. The patient looks much better today. His neurologic exam is at baseline. His neck incision is healing nicely. He did ambulate around the unit yesterday. He is tolerating his diet. His pain is better controlled. I feel he is stable for discharge today.  Durene Cal

## 2013-08-06 NOTE — Progress Notes (Signed)
Pt d/c home per MD order, pt VSS, interpreter line offered by RN, pt and family declined, pt stated son will be here upon d/c, son arrived d/c instructions given with prescription, all questions answered

## 2013-08-07 ENCOUNTER — Encounter (HOSPITAL_COMMUNITY): Payer: Self-pay | Admitting: Vascular Surgery

## 2013-08-11 ENCOUNTER — Ambulatory Visit: Payer: Self-pay

## 2013-08-18 ENCOUNTER — Ambulatory Visit: Payer: Self-pay

## 2013-08-21 ENCOUNTER — Encounter: Payer: Self-pay | Admitting: Vascular Surgery

## 2013-08-21 ENCOUNTER — Encounter: Payer: Self-pay | Admitting: Internal Medicine

## 2013-08-22 ENCOUNTER — Encounter: Payer: Self-pay | Admitting: Internal Medicine

## 2013-08-22 ENCOUNTER — Ambulatory Visit (INDEPENDENT_AMBULATORY_CARE_PROVIDER_SITE_OTHER): Payer: Medicaid Other | Admitting: Vascular Surgery

## 2013-08-22 ENCOUNTER — Encounter: Payer: Self-pay | Admitting: Vascular Surgery

## 2013-08-22 DIAGNOSIS — I6529 Occlusion and stenosis of unspecified carotid artery: Secondary | ICD-10-CM

## 2013-08-22 NOTE — Progress Notes (Signed)
The patient has today for followup of left carotid endarterectomy and Dacron patch angioplasty for symptomatic carotid disease 01 08/04/2013. He had a significant left brain stroke preoperatively. CT angiogram revealed severe bilateral carotid stenosis. He did well and was discharged to home on postoperative day #1.  He does have more than the usual amount of tenderness around the incision and also a left temporal headache. I explained that this is related to nerve irritation. He does have a [place] interpreter here today as well he does understand [place] well.  Isn't seal incision is healing quite nicely with no evidence of hematoma. He does have significant left facial droop and right arm weakness. He does walk without difficulty.  I did explain the significance of his severe asymptomatic right carotid stenosis and recommended elective staged right carotid endarterectomy. He currently is applying for Medicaid and wishes to defer this until he has coverage. I explained that he is at some slight risk for stroke over the short period of time. He understands this and will notify assistant CS coverage. We have scheduled an appointment for him again in 3 months to ensure that we do not lose him to followup. He will notify us immediately should he develop any new neurologic deficits

## 2013-08-25 ENCOUNTER — Encounter: Payer: Self-pay | Admitting: Internal Medicine

## 2013-11-27 ENCOUNTER — Encounter: Payer: Self-pay | Admitting: Vascular Surgery

## 2013-11-28 ENCOUNTER — Ambulatory Visit: Payer: Self-pay | Admitting: Vascular Surgery

## 2013-12-11 ENCOUNTER — Encounter: Payer: Self-pay | Admitting: Vascular Surgery

## 2013-12-12 ENCOUNTER — Encounter (INDEPENDENT_AMBULATORY_CARE_PROVIDER_SITE_OTHER): Payer: Self-pay

## 2013-12-12 ENCOUNTER — Encounter: Payer: Self-pay | Admitting: Vascular Surgery

## 2013-12-12 ENCOUNTER — Ambulatory Visit (INDEPENDENT_AMBULATORY_CARE_PROVIDER_SITE_OTHER): Payer: Medicaid Other | Admitting: Vascular Surgery

## 2013-12-12 DIAGNOSIS — Z48812 Encounter for surgical aftercare following surgery on the circulatory system: Secondary | ICD-10-CM

## 2013-12-12 DIAGNOSIS — I6529 Occlusion and stenosis of unspecified carotid artery: Secondary | ICD-10-CM

## 2013-12-12 NOTE — Progress Notes (Signed)
Patient name: Juan [name] MRN: [id number] DOB: Sep 02, 1951 Sex: male     Reason for referral:  Chief Complaint  Patient presents with  . Carotid    4 mo F/up lLeft cea 08-04-13    HISTORY OF PRESENT ILLNESS: The patient presents today for continued discussion of extracranial cerebrovascular occlusive disease. He is known to me from a prior left carotid endarterectomy in August of 2014. At that time he had had a left brain stroke that left him with right hand weakness. He also was found to have severe right internal carotid artery stenosis. It was recommended he undergo a staged right carotid endarterectomy. He did not wish to proceed at that time since he had no insurance coverage and wanted to wait several months until he had Medicaid. He presents today for continued discussion. He has had no new neurologic deficits. He does continue to have persistent weakness and is right-handed. He has no right leg weakness and has never had any left-sided weakness. He speaks broken [place] and has a [place] interpreter with him today. He has had resolution of all perioperative soreness and a difficulty with his endarterectomy.  Past Medical History  Diagnosis Date  . Hyperlipemia   . Stroke 06/2013  . Carotid artery occlusion     Past Surgical History  Procedure Laterality Date  . Neck surgery  2003    Cord Compression  . Tee without cardioversion N/A 07/20/2013    Procedure: TRANSESOPHAGEAL ECHOCARDIOGRAM (TEE);  Surgeon: Wendall Stade, MD;  [facility] ENDOSCOPY;  Service: Cardiovascular;  Laterality: N/A;  . Endarterectomy Left 08/04/2013    Procedure: Left Carotid Endarterectomy with hemashield patch angioplasty;  Surgeon: Larina Earthly, MD;  Location: [facility] OR;  Service: Vascular;  Laterality: Left;  . Carotid endarterectomy Left 08-04-13    cea    History   Social History  . Marital Status: Married    Spouse Name: N/A    Number of Children: N/A  . Years of Education: N/A    Occupational History  . Not on file.   Social History Main Topics  . Smoking status: Former Smoker    Quit date: 12/28/2005  . Smokeless tobacco: Former Neurosurgeon  . Alcohol Use: No     Comment: a few drinks occasionally   . Drug Use: No  . Sexual Activity: Not on file   Other Topics Concern  . Not on file   Social History Narrative   Lives at home with wife, son, and grandchildren.    History reviewed. No pertinent family history.  Allergies as of 12/12/2013  . (No Known Allergies)    Current Outpatient Prescriptions on File Prior to Visit  Medication Sig Dispense Refill  . aspirin EC 81 MG EC tablet Take 1 tablet (81 mg total) by mouth daily.  30 tablet  1  . atorvastatin (LIPITOR) 80 MG tablet Take 1 tablet (80 mg total) by mouth daily.  30 tablet  1  . oxyCODONE-acetaminophen (PERCOCET/ROXICET) 5-325 MG per tablet Take 1 tablet by mouth every 6 (six) hours as needed for pain.  30 tablet  0  . oxyCODONE-acetaminophen (ROXICET) 5-325 MG per tablet Take 1 tablet by mouth every 4 (four) hours as needed for pain.  20 tablet  0   No current facility-administered medications on file prior to visit.     REVIEW OF SYSTEMS:  No change from prior evaluation  General: The patient is a well-nourished male, in no acute distress. Vital signs are  BP 185/111  Pulse 82  Resp 16  Ht 5\' 5"  (1.651 m)  Wt 162 lb (73.483 kg)  BMI 26.96 kg/m2  SpO2 99% Pulmonary: There is a good air exchange bilaterally without wheezing or rales. Abdomen: Soft and non-tender with normal pitch bowel sounds. Musculoskeletal: There are no major deformities.  There is no significant extremity pain. Neurologic: Diminished control and grip strength right hand otherwise normal Skin: There are no ulcer or rashes noted. Psychiatric: The patient has normal affect. Cardiovascular: There is a regular rate and rhythm without significant murmur appreciated. Left carotid incision well-healed with no bruits  bilaterally   Impression and Plan:  Status post left carotid endarterectomy for left carotid stenosis. Known right carotid stenosis, asymptomatic. Recommend right carotid endarterectomy. We will schedule this at the patient's convenience. He understands expected one night hospitalization and understands the slight risk of stroke with the procedure.    Zahmir Lalla [facility] [place] Office: [phone]

## 2013-12-20 ENCOUNTER — Telehealth: Payer: Self-pay | Admitting: *Deleted

## 2013-12-20 NOTE — Telephone Encounter (Signed)
I called today to see if they had been able to go to the [facility] to have his BP checked & possibly get medicine for BP. The son said no, they had gone there & gotten an appt but they didn't do anything when they walked in. I talked to a receptionist at the clinic and she said that was all they said they needed. I told her about the BP when in the office and that he needed CEA as soon as BP was under control. She said to tell him to come to the clinic Monday between 9-11 or 2-3:30 and they would see him for the BP.

## 2013-12-22 ENCOUNTER — Telehealth: Payer: Self-pay

## 2013-12-22 NOTE — Telephone Encounter (Signed)
Notified pt's son, [name], of the hours of the [facility] to have pt's BP evaluated.  Advised to take pt. to the [facility] 9:00-11:00 AM or 2:00 - 3:30 PM on Monday, 12/25/13.  Advised on the importance of getting the BP evaluated and under control, so pt's surgery - Right Carotid Endarterectomy can be scheduled.  Son verb. understanding.

## 2014-01-04 ENCOUNTER — Telehealth: Payer: Self-pay | Admitting: *Deleted

## 2014-01-04 NOTE — Telephone Encounter (Signed)
I spoke with "[name]" his son; he said that the pt had been to [facility]. He said Mr Juan [name] had received medicine but his BP was still "alittle" high. He could not give me BP numbers. Mr Juan [name] has an appt with them 01/31/14 and will call us back to hopefully schedule surgery after that appointment.

## 2014-02-18 ENCOUNTER — Emergency Department (HOSPITAL_COMMUNITY): Payer: PRIVATE HEALTH INSURANCE

## 2014-02-18 ENCOUNTER — Emergency Department (HOSPITAL_COMMUNITY)
Admission: EM | Admit: 2014-02-18 | Discharge: 2014-02-18 | Disposition: A | Discharge status: Home / Self Care | Payer: Self-pay | Attending: Emergency Medicine | Admitting: Emergency Medicine

## 2014-02-18 ENCOUNTER — Encounter (HOSPITAL_COMMUNITY): Payer: Self-pay | Admitting: Emergency Medicine

## 2014-02-18 DIAGNOSIS — Z7982 Long term (current) use of aspirin: Secondary | ICD-10-CM | POA: Insufficient documentation

## 2014-02-18 DIAGNOSIS — Z87891 Personal history of nicotine dependence: Secondary | ICD-10-CM | POA: Insufficient documentation

## 2014-02-18 DIAGNOSIS — R51 Headache: Secondary | ICD-10-CM | POA: Insufficient documentation

## 2014-02-18 DIAGNOSIS — Y9301 Activity, walking, marching and hiking: Secondary | ICD-10-CM | POA: Insufficient documentation

## 2014-02-18 DIAGNOSIS — W009XXA Unspecified fall due to ice and snow, initial encounter: Secondary | ICD-10-CM

## 2014-02-18 DIAGNOSIS — Z8673 Personal history of transient ischemic attack (TIA), and cerebral infarction without residual deficits: Secondary | ICD-10-CM | POA: Insufficient documentation

## 2014-02-18 DIAGNOSIS — S46909A Unspecified injury of unspecified muscle, fascia and tendon at shoulder and upper arm level, unspecified arm, initial encounter: Secondary | ICD-10-CM | POA: Insufficient documentation

## 2014-02-18 DIAGNOSIS — M79601 Pain in right arm: Secondary | ICD-10-CM

## 2014-02-18 DIAGNOSIS — Y9289 Other specified places as the place of occurrence of the external cause: Secondary | ICD-10-CM | POA: Insufficient documentation

## 2014-02-18 DIAGNOSIS — M79602 Pain in left arm: Secondary | ICD-10-CM

## 2014-02-18 DIAGNOSIS — M2550 Pain in unspecified joint: Secondary | ICD-10-CM | POA: Insufficient documentation

## 2014-02-18 DIAGNOSIS — E785 Hyperlipidemia, unspecified: Secondary | ICD-10-CM | POA: Insufficient documentation

## 2014-02-18 DIAGNOSIS — W010XXA Fall on same level from slipping, tripping and stumbling without subsequent striking against object, initial encounter: Secondary | ICD-10-CM | POA: Insufficient documentation

## 2014-02-18 DIAGNOSIS — S4980XA Other specified injuries of shoulder and upper arm, unspecified arm, initial encounter: Secondary | ICD-10-CM | POA: Insufficient documentation

## 2014-02-18 DIAGNOSIS — M25511 Pain in right shoulder: Secondary | ICD-10-CM

## 2014-02-18 DIAGNOSIS — I6529 Occlusion and stenosis of unspecified carotid artery: Secondary | ICD-10-CM | POA: Insufficient documentation

## 2014-02-18 DIAGNOSIS — Z79899 Other long term (current) drug therapy: Secondary | ICD-10-CM | POA: Insufficient documentation

## 2014-02-18 DIAGNOSIS — M549 Dorsalgia, unspecified: Secondary | ICD-10-CM | POA: Insufficient documentation

## 2014-02-18 MED ORDER — ACETAMINOPHEN 500 MG PO TABS: 500.0000 mg | ORAL_TABLET | Freq: Four times a day (QID) | ORAL | Status: DC | PRN

## 2014-02-18 MED ORDER — OXYCODONE-ACETAMINOPHEN 5-325 MG PO TABS
2.0000 | ORAL_TABLET | Freq: Once | ORAL | Status: AC
Start: 2014-02-18 — End: 2014-02-18
  Administered 2014-02-18: 2 via ORAL
  Filled 2014-02-18: qty 2

## 2014-02-18 MED ORDER — TRAMADOL HCL 50 MG PO TABS: 50.0000 mg | ORAL_TABLET | Freq: Four times a day (QID) | ORAL | Status: DC | PRN

## 2014-02-18 NOTE — ED Provider Notes (Signed)
CSN: [id number]     Arrival date & time 02/18/14  1222 History   First MD Initiated Contact with Patient 02/18/14 1307     Chief Complaint  Patient presents with  . Fall     (Consider location/radiation/quality/duration/timing/severity/associated sxs/prior Treatment) The history is provided by the patient and a relative. The history is limited by a language barrier. A language interpreter was used (adult son-no professional interpretor available ).   Pt is a 63yo male with hx of stroke presenting with head, back, and bilateral arm pain after slipping on ice in parking lot at church, fell on his back and hit his head. Denies LOC. Denies nausea or change in vision. Pt c/o bilateral arm pain, unable to give severity, states it is constant, sharp pain, worst in right shoulder, worse with movement.  Pt reports decreased strength in right arm chronically due to stroke. Denies increased weakness after fall today.  No pain medication PTA.  Denies nausea or vomiting.  Denies neck, chest, or abdominal pain. Denies hip or leg pain.  Denies numbness or tingling in arms or legs.  Past Medical History  Diagnosis Date  . Hyperlipemia   . Stroke 06/2013  . Carotid artery occlusion    Past Surgical History  Procedure Laterality Date  . Neck surgery  2003    Cord Compression  . Tee without cardioversion N/A 07/20/2013    Procedure: TRANSESOPHAGEAL ECHOCARDIOGRAM (TEE);  Surgeon: Wendall StadePeter C Nishan, MD;  [facility];  Service: Cardiovascular;  Laterality: N/A;  . Endarterectomy Left 08/04/2013    Procedure: Left Carotid Endarterectomy with hemashield patch angioplasty;  Surgeon: Larina Earthlyodd F Early, MD;  [facility];  Service: Vascular;  Laterality: Left;  . Carotid endarterectomy Left 08-04-13    cea   History reviewed. No pertinent family history. History  Substance Use Topics  . Smoking status: Former Smoker    Quit date: 12/28/2005  . Smokeless tobacco: Former NeurosurgeonUser  . Alcohol Use: No   Comment: a few drinks occasionally     Review of Systems  Musculoskeletal: Positive for arthralgias, back pain and myalgias. Negative for gait problem, joint swelling, neck pain and neck stiffness.  Neurological: Positive for headaches.  All other systems reviewed and are negative.      Allergies  Review of patient's allergies indicates no known allergies.  Home Medications   Current Outpatient Rx  Name  Route  Sig  Dispense  Refill  . aspirin EC 81 MG EC tablet   Oral   Take 1 tablet (81 mg total) by mouth daily.   30 tablet   1   . atorvastatin (LIPITOR) 80 MG tablet   Oral   Take 1 tablet (80 mg total) by mouth daily.   30 tablet   1   . lisinopril (PRINIVIL,ZESTRIL) 5 MG tablet   Oral   Take 5 mg by mouth daily.         Marland Kitchen. acetaminophen (TYLENOL) 500 MG tablet   Oral   Take 1 tablet (500 mg total) by mouth every 6 (six) hours as needed.   30 tablet   0   . metFORMIN (GLUCOPHAGE) 500 MG tablet   Oral   Take 500 mg by mouth daily.         . traMADol (ULTRAM) 50 MG tablet   Oral   Take 1 tablet (50 mg total) by mouth every 6 (six) hours as needed.   15 tablet   0    BP 155/98  Pulse 88  Temp(Src) 98 F (36.7 C) (Oral)  Resp 18  SpO2 99% Physical Exam  Nursing note and vitals reviewed. Constitutional: He is oriented to person, place, and time. He appears well-developed and well-nourished.  Pt lying comfortably in exam bed, NAD.   HENT:  Head: Normocephalic and atraumatic.  Eyes: Conjunctivae are normal. No scleral icterus.  Neck: Normal range of motion.  No midline bone tenderness, no crepitus or step-offs.   Cardiovascular: Normal rate, regular rhythm and normal heart sounds.   Radial pulse 2+ bilaterally.  Pulmonary/Chest: Effort normal and breath sounds normal. No respiratory distress. He has no wheezes. He has no rales. He exhibits no tenderness.  Abdominal: Soft. Bowel sounds are normal. He exhibits no distension and no mass. There is no  tenderness. There is no rebound and no guarding.  Musculoskeletal: He exhibits tenderness. He exhibits no edema.  No midline spinal tenderness. No tenderness to paraspinal muscles.  RIGHT arm: decreased ROM shoulder (chronic per pt, due to CVA).  FROM elbow. 4/5 grip strength (chronic, due to CVA).   LEFT arm: FROM left shoulder and elbow. 5/5 grip strength. Tenderness to light touch on volar aspects both forearms.     Neurological: He is alert and oriented to person, place, and time. No cranial nerve deficit. Coordination normal.  Normal gait  Skin: Skin is warm and dry.    ED Course  Procedures (including critical care time) Labs Review Labs Reviewed - No data to display Imaging Review Dg Shoulder Right  02/18/2014   CLINICAL DATA:  Fall, pain  EXAM: RIGHT SHOULDER - 2+ VIEW  COMPARISON:  None.  FINDINGS: Minor degenerative changes of the [facility] and glenohumeral joints. No malalignment or fracture.  IMPRESSION: No acute osseous finding.   Electronically Signed   By: Ruel Favors M.D.   On: 02/18/2014 15:44   Dg Elbow Complete Left  02/18/2014   CLINICAL DATA:  Left elbow pain secondary to a fall today.  EXAM: LEFT ELBOW - COMPLETE 3+ VIEW  COMPARISON:  None.  FINDINGS: There is no fracture or dislocation or joint effusion. There are minimal degenerative changes on the olecranon.  IMPRESSION: No acute abnormality.   Electronically Signed   By: Geanie Cooley M.D.   On: 02/18/2014 15:49   Dg Elbow Complete Right  02/18/2014   CLINICAL DATA:  Elbow pain secondary to a fall today.  EXAM: RIGHT ELBOW - COMPLETE 3+ VIEW  COMPARISON:  None.  FINDINGS: There is no fracture or dislocation or joint effusion. There is slight degenerative changes at the elbow joint as well as calcification in the origin of the common flexor tendon consistent with chronic calcific tendinosis.  IMPRESSION: No acute abnormality.   Electronically Signed   By: Geanie Cooley M.D.   On: 02/18/2014 15:48    EKG Interpretation    None       MDM   Final diagnoses:  Fall from slipping on ice  Bilateral arm pain  Right shoulder pain    Will get plain films right shoulder and bilateral elbows due to pain, low concern for elbow fracture or dislocation due to FROM, however, higher suspicion for right shoulder as full exam is limited by pt's baseline ROM of right shoulder.  Not concerned for intracranial hemorrhage due to no LOC, nausea, vomiting, change in vision. Pt appears alert and oriented. No tenderness, crepitus or signs of trauma to head or neck.   Plain films right shoulder, right elbow, left elbow: no acute  abnormality   Will discharge home with pain medication, Rx: tramadol. Advised to f/u with PCP. Resource guide provided as Dr. Delane Ginger is listed as PCP but pt reports not having one at this time. Return precautions provided. Pt verbalized understanding and agreement with tx plan.     Junius Finner, PA-C 02/18/14 1643

## 2014-02-18 NOTE — ED Notes (Addendum)
Pt presents to department for evaluation of fall. States he accidentally slipped and fell on ice in parking lot at church. Now c/o bilateral arm pain, also states head and R shoulder pain. No obvious deformities noted. Able to move all extremities. Pt is alert and oriented x4. No signs of distress noted.

## 2014-02-18 NOTE — ED Notes (Signed)
Pt complaining of head pain, back pain, and left arm and shoulder pain after a fall on the ice.

## 2014-02-18 NOTE — ED Notes (Signed)
Patient transported to X-ray 

## 2014-02-18 NOTE — Discharge Instructions (Signed)
°Emergency Department Resource Guide °1) Find a Doctor and Pay Out of Pocket °Although you won't have to find out who is covered by your insurance plan, it is a good idea to ask around and get recommendations. You will then need to call the office and see if the doctor you have chosen will accept you as a new patient and what types of options they offer for patients who are self-pay. Some doctors offer discounts or will set up payment plans for their patients who do not have insurance, but you will need to ask so you aren't surprised when you get to your appointment. ° °2) Contact Your Local Health Department °Not all health departments have doctors that can see patients for sick visits, but many do, so it is worth a call to see if yours does. If you don't know where your local health department is, you can check in your phone book. The CDC also has a tool to help you locate your state's health department, and many state websites also have listings of all of their local health departments. ° °3) Find a Walk-in Clinic °If your illness is not likely to be very severe or complicated, you may want to try a walk in clinic. These are popping up all over the country in pharmacies, drugstores, and shopping centers. They're usually staffed by nurse practitioners or physician assistants that have been trained to treat common illnesses and complaints. They're usually fairly quick and inexpensive. However, if you have serious medical issues or chronic medical problems, these are probably not your best option. ° °No Primary Care Doctor: °- Call [facility] at  832-8000 - they can help you locate a primary care doctor that  accepts your insurance, provides certain services, etc. °- Physician Referral Service- 1-800-533-3463 ° °Chronic Pain Problems: °Organization         Address  Phone   Notes  °[facility]  (336) 297-2271 Patients need to be referred by their primary care doctor.  ° °Medication  Assistance: °Organization         Address  Phone   Notes  °[place] [facility] [address] °[place], [place] [address] (336) 641-8030 --Must be a resident of [place] °-- Must have NO insurance coverage whatsoever (no Medicaid/ Medicare, etc.) °-- The pt. MUST have a primary care doctor that directs their care regularly and follows them in the community °  °[facility]  (866) 331-1348   °[employer]  (888) 892-1162   ° °Agencies that provide inexpensive medical care: °Organization         Address  Phone   Notes  °[facility]  (336) 832-8035   °[facility]    (336) 832-7272   °[facility] [address] °[place], [place] [address] (336) 832-4777   °[facility] [address], °[place] (336) 271-4999   °[facility]    (336) 373-0678   °[facility]    (336) 272-1050   °[facility] ° [address], [place] Phone:  (336) 832-4444, Fax:  (336) 832-4440 Hours of Operation:  9 am - 6 pm, M-F.  Also accepts Medicaid/Medicare and self-pay.  °[facility] ° [address], [place] Phone: (336) 832-3150, Fax: (336) 832-3151. Hours of Operation:  8:30 am - 5:30 pm, M-F.  Also accepts Medicaid and self-pay.  °[facility] [place] [address], [place] Phone: (336) 878-6027   °[facility] [address], [place], [place] (336)723-1848, Ext. 123 Mondays & Thursdays: 7-9 AM.  First 15 patients are seen on a first come, first serve basis. °  ° °Medicaid-accepting [place] Providers: ° °Organization         Address  Phone   Notes  °[facility] [address], [place] (336) 641-2100 Also accepts self-pay patients.  °[facility] [address], [place] ° (336) 856-9996   °[facility] [address], [place]  (336) 288-8857   °[facility] [address] [place] [address], [place] (336) 299-7000   °Veita Bland [address], [place]  ° (336) 373-1557 Only accepts [place] Access Medicaid patients after they have their name applied to their card.  ° °Self-Pay (no insurance) in [place]: ° °Organization         Address  Phone   Notes  °Sickle Cell Patients, [facility] [address], [place] (336) 832-1970   °[facility] [address], [place] (336) 832-4400   °[facility] [place] ° [address],  (336) 992-4800   °[facility]/Dr. Osei-Bonsu ° [address] [place] [address], [place] or [address], [place] (336) 841-8500 Phone number for both [place] and [place] locations is the same.  °[facility] [address], [place] (336) 299-0000   °[facility] [address] [place] [address], [place] or [address] (336) 852-7530 °(336) 878-2260   °[facility] [address], [place] (336) 350-1642, phone; (336) 294-5005, fax Sees patients 1st and 3rd Saturday of every month.  Must not qualify for public or private insurance (i.e. Medicaid, Medicare, [place] Health Choice, Veterans' Benefits) • Household income should be no more than 200% of the poverty level •The clinic cannot treat you if you are pregnant or think you are pregnant • Sexually transmitted diseases are not treated at the clinic.  ° ° °Dental Care: °Organization         Address  Phone  Notes  °[place] [facility] [address], [place] (336) 641-6152 Accepts children up to age 21 who are enrolled in Medicaid or [place] Health Choice; pregnant women with a Medicaid card; and children who have applied for Medicaid or [place] Health Choice, but were declined, whose parents can pay a reduced fee at time of service.  °[place]  [facility] [place]  [address], [place] (336) 641-7733 Accepts children up to age 21 who are enrolled in Medicaid or [place] Health Choice; pregnant women with a Medicaid card; and children who have applied for Medicaid or  Health Choice, but were declined, whose parents can pay a reduced fee at time of service.  °[facility] ° [address], [place] (336) 641-4533 Patients are seen by appointment only. Walk-ins are not accepted. [facility] will see patients 18 years of age and older. °Monday - Tuesday (8am-5pm) °Most Wednesdays (8:30-5pm) °$30 per visit, cash only  °[facility] ° [address], [place] (336) 641-4533 Patients are seen by appointment only. Walk-ins are not accepted. [facility] will see patients 18 years of age and older. °One   Wednesday Evening (Monthly: Volunteer Based).  $30 per visit, cash only  °[facility]  (919) 537-3737 for adults; Children under age 4, call Graduate Pediatric Dentistry at (919) [phone]. Children aged 4-14, please call (919) [phone] to request a pediatric application. ° Dental services are provided in all areas of dental care including fillings, crowns and bridges, complete and partial dentures, implants, gum treatment, root canals, and extractions. Preventive care is also provided. Treatment is provided to both adults and children. °Patients are selected via a lottery and there is often a waiting list. °  °[facility] [address], °[place] ° (336) 763-8833 [email or web address] °  °[facility] [address], [place], [place] (336)723-1848, Ext. 123 Second and Fourth Thursday of each month, opens at 6:30 AM; Clinic ends at 9 AM.  Patients are seen on a first-come first-served basis, and a limited number are seen during each clinic.  ° °[facility] ° [address], [place], [place] (336) 723-7904    Eligibility Requirements °You must have lived in [place], [place], or [place] counties for at least the last three months. °  You cannot be eligible for state or federal sponsored healthcare insurance, including Veterans Administration, Medicaid, or Medicare. °  You generally cannot be eligible for healthcare insurance through your employer.  °  How to apply: °Eligibility screenings are held every Tuesday and Wednesday afternoon from 1:00 pm until 4:00 pm. You do not need an appointment for the interview!  °[facility] [address], [place], [place] 336-631-2330   °[place] [facility]  336-342-8273   °[facility]  336-703-3100   °[facility]  336-570-6415   ° °Behavioral Health Resources in the Community: °Intensive Outpatient Programs °Organization         Address  Phone  Notes  °[facility] [address], [place], [place] 336-878-6098   °[facility] [address], [place], [place] 336-832-9800   °[facility] [address], [place], [place] ° 336-882-2125   °[facility] [address],  °[place], [place] 1-800-853-5163 or 336-641-4981   °Substance Abuse Resources °Organization         Address  Phone  Notes  °[facility]  336-882-2125   °[facility]  336-784-9470   °[facility]  336-285-9073   °[facility]  336-845-3988   °Residential & Outpatient Substance Abuse Program  1-800-659-3381   °Psychological Services °Organization         Address  Phone  Notes  ° Health  336- 832-9600   °[facility]  336- 378-7881   °[facility] [address], [place] 1-800-853-5163 or 336-641-4981   ° °Mobile Crisis Teams °Organization         Address  Phone  Notes  °[facility], Mobile Crisis Care Unit  1-877-626-1772   °Assertive °[facility] ° [address]  [place], [place] 336-834-9664   °Sharon DeEsch [address] °[place] 336-554-5454   ° °Self-Help/Support Groups °Organization         Address  Phone             Notes  °[facility]  - variety of support groups  336- 373-1402 Call for more information  °Narcotics Anonymous (NA), [facility] [address], °[place]  2 meetings at this location  ° °  Residential Treatment Programs °Organization         Address  Phone  Notes  °[facility] [address],    °[place]  1-866-801-8205   °[facility] ° [address], [place], [place] 704-293-8524   °[facility] [address], [place] 336-845-3988 Admissions: 8am-3pm M-F  °[facility] [address]    °[place], [place] 336-841-1104   °[facility] [address], [place], [place] 336-379-7146   °[facility] [address]  °[place], [place] 336-285-9073   °[facility] Intensive Outpatient [address], [place], [place] 336-852-3033   °[facility] [address]  °[place], [place] 1-877-615-2722 or 336-784-9470   °[facility] [address] [place], [place] 336-227-7417 Accepts Medicaid  °[facility] [address]  °[place] 1-800-659-3381 Substance Abuse/Addiction Treatment  ° °[place] Behavioral Health Resources °Organization         Address  Phone  Notes  °[facility]  (888) 581-9988   °Julie Brannon, PhD [address] [place], [place]   (336) 349-5553 or (336) 951-0000   °[facility]   [address] °[place], [place] (336) 349-4454   °[facility] [address], [place], [place] (336) 342-8316 Insurance/Medicaid/sponsorship through [facility]  °[facility] [address]                                    [place], [place] (336) 342-8316 Therapy/tele-psych/case    °[facility] [address]  ° [place], [place] (336) 349-2233    °Dr. Arfeen  (336) 349-4544   °[facility] [place]  [employer] [place] [facility] 1) [address], [place] °2) [address], [place] °3)  [address], [place] (336) 349-3220 °(336) 342-7768 ° °(336) 342-8140   °[place] [facility] (336) 342-1394 or (336) 342-3537 (After Hours)    ° ° °

## 2014-02-22 NOTE — ED Provider Notes (Signed)
Medical screening examination/treatment/procedure(s) were performed by non-physician practitioner and as supervising physician I was immediately available for consultation/collaboration.  EKG Interpretation   None         Dasani Crear, MD 02/22/14 1438 

## 2014-04-05 DIAGNOSIS — Z0279 Encounter for issue of other medical certificate: Secondary | ICD-10-CM

## 2014-06-28 ENCOUNTER — Other Ambulatory Visit (HOSPITAL_COMMUNITY): Payer: Self-pay | Admitting: Internal Medicine

## 2014-06-28 DIAGNOSIS — I739 Peripheral vascular disease, unspecified: Secondary | ICD-10-CM

## 2014-07-04 ENCOUNTER — Ambulatory Visit (HOSPITAL_COMMUNITY)
Admission: RE | Admit: 2014-07-04 | Discharge: 2014-07-04 | Disposition: A | Discharge status: Home / Self Care | Payer: PRIVATE HEALTH INSURANCE | Source: Ambulatory Visit | Attending: Internal Medicine | Admitting: Internal Medicine

## 2014-07-04 DIAGNOSIS — I739 Peripheral vascular disease, unspecified: Secondary | ICD-10-CM

## 2014-07-04 DIAGNOSIS — M79609 Pain in unspecified limb: Secondary | ICD-10-CM

## 2014-07-04 NOTE — Progress Notes (Signed)
VASCULAR LAB PRELIMINARY  PRELIMINARY  PRELIMINARY  PRELIMINARY  VASCULAR LAB PRELIMINARY  ARTERIAL  ABI completed: 162   RIGHT    LEFT    PRESSURE WAVEFORM  PRESSURE WAVEFORM  BRACHIAL 162 Triphasic BRACHIAL 138  Triphasic  DP 165 Triphasic DP 151 Bilateral  PT 155 Biphasic PT 162 Triphasic    RIGHT LEFT  ABI 1.02 1.0    ABIs and Doppler waveforms are within normal limits bilaterally at rest.  Olga Seyler, RVS 07/04/2014, 2:26 PM

## 2014-10-15 ENCOUNTER — Encounter: Payer: Self-pay | Admitting: Internal Medicine

## 2014-10-15 ENCOUNTER — Encounter: Payer: PRIVATE HEALTH INSURANCE | Admitting: Internal Medicine

## 2014-10-23 ENCOUNTER — Ambulatory Visit: Payer: PRIVATE HEALTH INSURANCE | Admitting: Internal Medicine

## 2014-10-25 ENCOUNTER — Ambulatory Visit (INDEPENDENT_AMBULATORY_CARE_PROVIDER_SITE_OTHER): Payer: Self-pay | Admitting: Internal Medicine

## 2014-10-25 ENCOUNTER — Encounter: Payer: Self-pay | Admitting: Internal Medicine

## 2014-10-25 VITALS — BP 159/92 | HR 75 | Temp 97.8°F | Ht 65.0 in | Wt 159.2 lb

## 2014-10-25 DIAGNOSIS — I10 Essential (primary) hypertension: Secondary | ICD-10-CM

## 2014-10-25 DIAGNOSIS — Z8673 Personal history of transient ischemic attack (TIA), and cerebral infarction without residual deficits: Secondary | ICD-10-CM

## 2014-10-25 DIAGNOSIS — IMO0002 Reserved for concepts with insufficient information to code with codable children: Secondary | ICD-10-CM

## 2014-10-25 DIAGNOSIS — Z8669 Personal history of other diseases of the nervous system and sense organs: Secondary | ICD-10-CM

## 2014-10-25 DIAGNOSIS — E785 Hyperlipidemia, unspecified: Secondary | ICD-10-CM

## 2014-10-25 DIAGNOSIS — E1165 Type 2 diabetes mellitus with hyperglycemia: Secondary | ICD-10-CM

## 2014-10-25 DIAGNOSIS — Z23 Encounter for immunization: Secondary | ICD-10-CM

## 2014-10-25 DIAGNOSIS — I63239 Cerebral infarction due to unspecified occlusion or stenosis of unspecified carotid arteries: Secondary | ICD-10-CM

## 2014-10-25 DIAGNOSIS — E118 Type 2 diabetes mellitus with unspecified complications: Secondary | ICD-10-CM

## 2014-10-25 DIAGNOSIS — Z1211 Encounter for screening for malignant neoplasm of colon: Secondary | ICD-10-CM

## 2014-10-25 DIAGNOSIS — Z0000 Encounter for general adult medical examination without abnormal findings: Secondary | ICD-10-CM

## 2014-10-25 LAB — COMPLETE METABOLIC PANEL WITH GFR
ALBUMIN: 3.9 g/dL (ref 3.5–5.2)
ALK PHOS: 219 U/L — AB (ref 39–117)
ALT: 43 U/L (ref 0–53)
AST: 25 U/L (ref 0–37)
BUN: 13 mg/dL (ref 6–23)
CALCIUM: 9.3 mg/dL (ref 8.4–10.5)
CO2: 25 meq/L (ref 19–32)
Chloride: 99 mEq/L (ref 96–112)
Creat: 1.09 mg/dL (ref 0.50–1.35)
GFR, EST AFRICAN AMERICAN: 82 mL/min
GFR, EST NON AFRICAN AMERICAN: 71 mL/min
GLUCOSE: 236 mg/dL — AB (ref 70–99)
POTASSIUM: 4.7 meq/L (ref 3.5–5.3)
Sodium: 137 mEq/L (ref 135–145)
TOTAL PROTEIN: 7.7 g/dL (ref 6.0–8.3)
Total Bilirubin: 0.5 mg/dL (ref 0.2–1.2)

## 2014-10-25 LAB — LIPID PANEL
CHOL/HDL RATIO: 4.3 ratio
CHOLESTEROL: 145 mg/dL (ref 0–200)
HDL: 34 mg/dL — ABNORMAL LOW (ref >39)
LDL Cholesterol: 75 mg/dL (ref 0–99)
Triglycerides: 182 mg/dL — ABNORMAL HIGH (ref <150)
VLDL: 36 mg/dL (ref 0–40)

## 2014-10-25 LAB — GLUCOSE, CAPILLARY: GLUCOSE-CAPILLARY: 220 mg/dL — AB (ref 70–99)

## 2014-10-25 LAB — POCT GLYCOSYLATED HEMOGLOBIN (HGB A1C): HEMOGLOBIN A1C: 8.6

## 2014-10-25 MED ORDER — LISINOPRIL 5 MG PO TABS: 5.0000 mg | ORAL_TABLET | Freq: Every day | ORAL | Status: DC

## 2014-10-25 MED ORDER — ASPIRIN 81 MG PO TBEC: 81.0000 mg | DELAYED_RELEASE_TABLET | Freq: Every day | ORAL | Status: DC

## 2014-10-25 NOTE — Progress Notes (Signed)
Medicine attending: Medical history, physical findings, medications, reviewed with resident physician Dr. Alvina ChouJacqueline Gill and I concur with her evaluation and management plan. Cephas DarbyJames Granfortuna, M.D., FACP

## 2014-10-25 NOTE — Assessment & Plan Note (Addendum)
Pt reports compliance with atorvastatin 80mg  daily. -check lipid panel

## 2014-10-25 NOTE — Patient Instructions (Addendum)
Thank you for your visit today.   Please return to the internal medicine clinic in 3 month(s) or sooner if needed.   We will need to check your stool for blood.  We will give you the cards to check your stool, please bring them back at next visit.  Please take your metformin 500mg  twice daily with meals.   Please take your aspirin 81mg  daily.     Please be sure to bring all of your medications with you to every visit; this includes herbal supplements, vitamins, eye drops, and any over-the-counter medications.   Should you have any questions regarding your medications and/or any new or worsening symptoms, please be sure to call the clinic at [phone].   If you believe that you are suffering from a life threatening condition or one that may result in the loss of limb or function, then you should call 911 or proceed to the nearest Emergency Department.   Diabetes Mellitus and Food It is important for you to manage your blood sugar (glucose) level. Your blood glucose level can be greatly affected by what you eat. Eating healthier foods in the appropriate amounts throughout the day at about the same time each day will help you control your blood glucose level. It can also help slow or prevent worsening of your diabetes mellitus. Healthy eating may even help you improve the level of your blood pressure and reach or maintain a healthy weight.  HOW CAN FOOD AFFECT ME? Carbohydrates Carbohydrates affect your blood glucose level more than any other type of food. Your dietitian will help you determine how many carbohydrates to eat at each meal and teach you how to count carbohydrates. Counting carbohydrates is important to keep your blood glucose at a healthy level, especially if you are using insulin or taking certain medicines for diabetes mellitus. Alcohol Alcohol can cause sudden decreases in blood glucose (hypoglycemia), especially if you use insulin or take certain medicines for diabetes  mellitus. Hypoglycemia can be a life-threatening condition. Symptoms of hypoglycemia (sleepiness, dizziness, and disorientation) are similar to symptoms of having too much alcohol.  If your health care provider has given you approval to drink alcohol, do so in moderation and use the following guidelines:  Women should not have more than one drink per day, and men should not have more than two drinks per day. One drink is equal to:  12 oz of beer.  5 oz of wine.  1 oz of hard liquor.  Do not drink on an empty stomach.  Keep yourself hydrated. Have water, diet soda, or unsweetened iced tea.  Regular soda, juice, and other mixers might contain a lot of carbohydrates and should be counted. WHAT FOODS ARE NOT RECOMMENDED? As you make food choices, it is important to remember that all foods are not the same. Some foods have fewer nutrients per serving than other foods, even though they might have the same number of calories or carbohydrates. It is difficult to get your body what it needs when you eat foods with fewer nutrients. Examples of foods that you should avoid that are high in calories and carbohydrates but low in nutrients include:  Trans fats (most processed foods list trans fats on the Nutrition Facts label).  Regular soda.  Juice.  Candy.  Sweets, such as cake, pie, doughnuts, and cookies.  Fried foods. WHAT FOODS CAN I EAT? Have nutrient-rich foods, which will nourish your body and keep you healthy. The food you should eat also will depend  on several factors, including:  The calories you need.  The medicines you take.  Your weight.  Your blood glucose level.  Your blood pressure level.  Your cholesterol level. You also should eat a variety of foods, including:  Protein, such as meat, poultry, fish, tofu, nuts, and seeds (lean animal proteins are best).  Fruits.  Vegetables.  Dairy products, such as milk, cheese, and yogurt (low fat is best).  Breads,  grains, pasta, cereal, rice, and beans.  Fats such as olive oil, trans fat-free margarine, canola oil, avocado, and olives. DOES EVERYONE WITH DIABETES MELLITUS HAVE THE SAME MEAL PLAN? Because every person with diabetes mellitus is different, there is not one meal plan that works for everyone. It is very important that you meet with a dietitian who will help you create a meal plan that is just right for you. Document Released: 09/10/2005 Document Revised: 12/19/2013 Document Reviewed: 11/10/2013 [facility] Patient Information 2015 [employer] [place] [employer] [place]. This information is not intended to replace advice given to you by your health care provider. Make sure you discuss any questions you have with your health care provider.

## 2014-10-25 NOTE — Assessment & Plan Note (Signed)
-  provided pt with hemoccult cards today (no insurance for referral for colonoscopy, however, he is in the process of trying to get Medicaid)

## 2014-10-25 NOTE — Assessment & Plan Note (Signed)
Pt s/p left endarerectomy by Dr. Arbie CookeyEarly last year presents for f/u.  Currently asymptomatic.  States that he is in the process of getting disability and Medicaid and then he will proceed with procedure.  Currently on ASA 81mg .  -referral to CSW to find out if he needs any assistance in the Medicaid process -continue ASA 81mg  (refilled today) and atorvastatin 80mg  daily  -follow up with Dr. Arbie CookeyEarly when Medicaid approved

## 2014-10-25 NOTE — Assessment & Plan Note (Signed)
BP Readings from Last 3 Encounters:  10/25/14 159/92  02/18/14 155/98  12/12/13 185/111    Lab Results  Component Value Date   NA 136 08/05/2013   K 3.9 08/05/2013   CREATININE 1.01 08/05/2013    Assessment: Blood pressure control: mildly elevated Progress toward BP goal:  unchanged Comments: reports taking lisinopril 5mg  daily   Plan: Medications:  increase lisinopril to 10mg  daily  Educational resources provided:   Self management tools provided:   Other plans:

## 2014-10-25 NOTE — Assessment & Plan Note (Addendum)
Lab Results  Component Value Date   HGBA1C 8.6 10/25/2014   HGBA1C 6.4 07/11/2013     Assessment: Diabetes control: fair control Progress toward A1C goal:  deteriorated Comments: pt has been followed by [facility] because he was unsure of how to find me?  he reports taking metformin 500mg  once daily; diet: he reports eating mainly vegetables (not fried)   Plan: Medications:  increase metformin to 500mg  twice daily with meals Home glucose monitoring: no need to check blood sugars only on metformin  Instruction/counseling given: discussed diet Other plans: check urine microalbumin and lipid panel, will need retinal scan at next OV; [facility]

## 2014-10-25 NOTE — Assessment & Plan Note (Signed)
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  He is currently on ASA 81mg  and atorvastatin 80mg .  Also with h/o HTN on lisinopril 5mg  daily and uncontrolled DMII now, on metformin 500mg  bid.  Was supposed to follow up with Dr. Arbie CookeyEarly when he received Medicaid for the right endarerectomy.  Completely asymptomatic, no new weakness.  On exam, has residual right sided UE and LE weakness. -continue currents meds -referral to CSW for help with Medicaid and inquire what other needs pt has -follow up with Dr. Arbie CookeyEarly when Medicaid approved

## 2014-10-25 NOTE — Progress Notes (Signed)
Patient ID: Juan [name], male   DOB: 03/15/1951, 63 y.o.   MRN: [id number]    Subjective:   Patient ID: Juan [name] male    DOB: 09/01/1951 63 y.o.    MRN: [id number] Health Maintenance Due: Health Maintenance Due  Topic Date Due  . Tetanus/tdap  03/29/1969  . Colonoscopy  03/29/2000  . Zostavax  03/29/2010  . Hemoglobin A1c  10/11/2013  . Influenza Vaccine  07/28/2014    _________________________________________________  HPI: Juan SenegalMr.Juan [name] is a 63 y.o. male [place] speaking male here with an interpretor for a routine visit.  Pt is new to me and has previously been followed by the [facility] due to a misunderstanding and miscommunication.  He reports that he did not know "where to find me."  He has a PMH outlined below.  Please see problem-based charting assessment and plan note for further details of medical issues addressed at today's visit.  PMH: Past Medical History  Diagnosis Date  . Hyperlipemia   . Stroke 06/2013  . Carotid artery occlusion     Medications: Current Outpatient Prescriptions on File Prior to Visit  Medication Sig Dispense Refill  . atorvastatin (LIPITOR) 80 MG tablet Take 1 tablet (80 mg total) by mouth daily.  30 tablet  1  . metFORMIN (GLUCOPHAGE) 500 MG tablet Take 500 mg by mouth daily.       No current facility-administered medications on file prior to visit.    Allergies: No Known Allergies  FH: No family history on file.  SH: History   Social History  . Marital Status: Married    Spouse Name: N/A    Number of Children: N/A  . Years of Education: N/A   Social History Main Topics  . Smoking status: Former Smoker    Quit date: 12/28/2005  . Smokeless tobacco: Former NeurosurgeonUser  . Alcohol Use: No     Comment: a few drinks occasionally   . Drug Use: No  . Sexual Activity: Not on file   Other Topics Concern  . Not on file   Social History Narrative   Lives at home with wife, son, and grandchildren.    Review of  Systems: Constitutional: Negative for fever, chills and weight loss.  Eyes: Negative for blurred vision.  Respiratory: Negative for cough and shortness of breath.  Cardiovascular: Negative for chest pain, palpitations and leg swelling.  Gastrointestinal: Negative for nausea, vomiting, abdominal pain, diarrhea, constipation and blood in stool.  Genitourinary: Negative for dysuria, urgency and frequency.  Musculoskeletal: Negative for myalgias and back pain.  Neurological: Negative for dizziness, weakness and headaches.     Objective:   Vital Signs: Filed Vitals:   10/25/14 0831  BP: 159/92  Pulse: 75  Temp: 97.8 F (36.6 C)  TempSrc: Oral  Height: 5\' 5"  (1.651 m)  Weight: 159 lb 3.2 oz (72.213 kg)  SpO2: 97%      BP Readings from Last 3 Encounters:  10/25/14 159/92  02/18/14 155/98  12/12/13 185/111    Physical Exam: Constitutional: Vital signs reviewed.  Patient is well-developed and well-nourished in NAD and cooperative with exam.  Head: Normocephalic and atraumatic. Eyes: PERRL, EOMI, conjunctivae nl, no scleral icterus.  Neck: Supple. Cardiovascular: RRR, no MRG. Pulmonary/Chest: normal effort, non-tender to palpation, CTAB, no wheezes, rales, or rhonchi. Abdominal: Soft. NT/ND +BS. Neurological: A&O x3, cranial nerves II-XII are grossly intact, moving all extremities, RUE 4/5, RLE 4/5; LUE 5/5, LLE 5/5.  Extremities: 2+DP b/l; no pitting edema. Skin: Warm, dry  and intact. No rash.   Assessment & Plan:   Assessment and plan was discussed and formulated with my attending.  [facility]    Topics to be addressed at next visit include: metformin compliance and BP.   Also needs orange card.

## 2014-10-26 LAB — MICROALBUMIN / CREATININE URINE RATIO
Creatinine, Urine: 146.2 mg/dL
MICROALB UR: 102.1 mg/dL — AB (ref <2.0)
Microalb Creat Ratio: 698.4 mg/g — ABNORMAL HIGH (ref 0.0–30.0)

## 2014-10-31 ENCOUNTER — Telehealth: Payer: Self-pay | Admitting: Licensed Clinical Social Worker

## 2014-10-31 NOTE — Telephone Encounter (Signed)
Mr. Juan [name] was referred to CSW for community resources for applying for disability/medicaid.  Attempted to contact Mr. Juan [name] at home number with telephonic interpreter, no answer/voicemail.  Chart indicates pt's son interprets for pt during office visit.  MyChart not set up.  CSW placed call to pt's son, Juan [name].  Son states pt does need information on applying for disability and medicaid.  CSW inquired as to what language information should be provided in, son states English is fine.  CSW will send information to Mr. Juan MalletNie regarding applying for Disability, Medicaid and information on [facility].

## 2014-11-02 ENCOUNTER — Other Ambulatory Visit (INDEPENDENT_AMBULATORY_CARE_PROVIDER_SITE_OTHER): Payer: Self-pay

## 2014-11-02 DIAGNOSIS — Z1211 Encounter for screening for malignant neoplasm of colon: Secondary | ICD-10-CM

## 2014-11-02 LAB — POC HEMOCCULT BLD/STL (HOME/3-CARD/SCREEN)
FECAL OCCULT BLD: NEGATIVE
FECAL OCCULT BLD: NEGATIVE
Fecal Occult Blood, POC: NEGATIVE

## 2014-11-02 NOTE — Addendum Note (Signed)
Addended by: Bufford SpikesFULCHER, Marelin Tat N on: 11/02/2014 11:59 AM   Modules accepted: Orders

## 2014-11-06 NOTE — Telephone Encounter (Signed)
Letter mailed with community information

## 2015-01-28 ENCOUNTER — Ambulatory Visit (INDEPENDENT_AMBULATORY_CARE_PROVIDER_SITE_OTHER): Payer: Self-pay | Admitting: Internal Medicine

## 2015-01-28 ENCOUNTER — Encounter: Payer: Self-pay | Admitting: Internal Medicine

## 2015-01-28 VITALS — BP 144/78 | HR 88 | Temp 98.0°F | Ht 65.0 in | Wt 157.9 lb

## 2015-01-28 DIAGNOSIS — E1165 Type 2 diabetes mellitus with hyperglycemia: Secondary | ICD-10-CM

## 2015-01-28 DIAGNOSIS — Z7982 Long term (current) use of aspirin: Secondary | ICD-10-CM

## 2015-01-28 DIAGNOSIS — E785 Hyperlipidemia, unspecified: Secondary | ICD-10-CM

## 2015-01-28 DIAGNOSIS — IMO0002 Reserved for concepts with insufficient information to code with codable children: Secondary | ICD-10-CM

## 2015-01-28 DIAGNOSIS — E1169 Type 2 diabetes mellitus with other specified complication: Secondary | ICD-10-CM

## 2015-01-28 DIAGNOSIS — Z87891 Personal history of nicotine dependence: Secondary | ICD-10-CM

## 2015-01-28 DIAGNOSIS — I10 Essential (primary) hypertension: Secondary | ICD-10-CM

## 2015-01-28 DIAGNOSIS — Z0000 Encounter for general adult medical examination without abnormal findings: Secondary | ICD-10-CM

## 2015-01-28 DIAGNOSIS — Z8673 Personal history of transient ischemic attack (TIA), and cerebral infarction without residual deficits: Secondary | ICD-10-CM

## 2015-01-28 DIAGNOSIS — E118 Type 2 diabetes mellitus with unspecified complications: Principal | ICD-10-CM

## 2015-01-28 LAB — BASIC METABOLIC PANEL WITH GFR
BUN: 12 mg/dL (ref 6–23)
CALCIUM: 9.3 mg/dL (ref 8.4–10.5)
CO2: 27 meq/L (ref 19–32)
Chloride: 99 mEq/L (ref 96–112)
Creat: 1.04 mg/dL (ref 0.50–1.35)
GFR, EST AFRICAN AMERICAN: 87 mL/min
GFR, EST NON AFRICAN AMERICAN: 76 mL/min
Glucose, Bld: 218 mg/dL — ABNORMAL HIGH (ref 70–99)
POTASSIUM: 4.4 meq/L (ref 3.5–5.3)
Sodium: 136 mEq/L (ref 135–145)

## 2015-01-28 LAB — POCT GLYCOSYLATED HEMOGLOBIN (HGB A1C): HEMOGLOBIN A1C: 8.5

## 2015-01-28 LAB — GLUCOSE, CAPILLARY: Glucose-Capillary: 239 mg/dL — ABNORMAL HIGH (ref 70–99)

## 2015-01-28 MED ORDER — METFORMIN HCL 500 MG PO TABS: 500.0000 mg | ORAL_TABLET | Freq: Two times a day (BID) | ORAL | Status: DC

## 2015-01-28 MED ORDER — ASPIRIN 81 MG PO TBEC: 81.0000 mg | DELAYED_RELEASE_TABLET | Freq: Every day | ORAL | Status: DC

## 2015-01-28 MED ORDER — ATORVASTATIN CALCIUM 80 MG PO TABS: 80.0000 mg | ORAL_TABLET | Freq: Every day | ORAL | Status: DC

## 2015-01-28 MED ORDER — LISINOPRIL 5 MG PO TABS: 5.0000 mg | ORAL_TABLET | Freq: Every day | ORAL | Status: DC

## 2015-01-28 NOTE — Assessment & Plan Note (Addendum)
-  will need to f/u with vascular surgery for endarderectomy once Medicaid eligible -continue ASA (reports not taking daily; I stressed the importance of daily ASA)

## 2015-01-28 NOTE — Assessment & Plan Note (Signed)
-   refilled atorvastatin 

## 2015-01-28 NOTE — Patient Instructions (Signed)
Thank you for your visit today.   Please return to the internal medicine clinic in 3 month(s) or sooner if needed.    I have made the following additions/changes to your medications: I have increased your metformin to twice daily with meals.  Please continue all of your other medications.   I have sent your medication refills to your pharmacy.   I have made the following referrals for you: Debera Lat, who is our diabetes specialist.    You need the following test(s) for regular health maintenance: colonoscopy.  Please be sure to bring all of your medications with you to every visit; this includes herbal supplements, vitamins, eye drops, and any over-the-counter medications.   Should you have any questions regarding your medications and/or any new or worsening symptoms, please be sure to call the clinic at [phone].   If you believe that you are suffering from a life threatening condition or one that may result in the loss of limb or function, then you should call 911 or proceed to the nearest Emergency Department.     A healthy lifestyle and preventative care can promote health and wellness.   Maintain regular health, dental, and eye exams.  Eat a healthy diet. Foods like vegetables, fruits, whole grains, low-fat dairy products, and lean protein foods contain the nutrients you need without too many calories. Decrease your intake of foods high in solid fats, added sugars, and salt. Get information about a proper diet from your caregiver, if necessary.  Regular physical exercise is one of the most important things you can do for your health. Most adults should get at least 150 minutes of moderate-intensity exercise (any activity that increases your heart rate and causes you to sweat) each week. In addition, most adults need muscle-strengthening exercises on 2 or more days a week.   Maintain a healthy weight. The body mass index (BMI) is a screening tool to identify possible weight  problems. It provides an estimate of body fat based on height and weight. Your caregiver can help determine your BMI, and can help you achieve or maintain a healthy weight. For adults 20 years and older:  A BMI below 18.5 is considered underweight.  A BMI of 18.5 to 24.9 is normal.  A BMI of 25 to 29.9 is considered overweight.  A BMI of 30 and above is considered obese.  Metformin tablets What is this medicine? METFORMIN (met FOR min) is used to treat type 2 diabetes. It helps to control blood sugar. Treatment is combined with diet and exercise. This medicine can be used alone or with other medicines for diabetes. This medicine may be used for other purposes; ask your health care provider or pharmacist if you have questions. COMMON BRAND NAME(S): Glucophage What should I tell my health care provider before I take this medicine? They need to know if you have any of these conditions: -anemia -frequently drink alcohol-containing beverages -become easily dehydrated -heart attack -heart failure that is treated with medications -kidney disease -liver disease -polycystic ovary syndrome -serious infection or injury -vomiting -an unusual or allergic reaction to metformin, other medicines, foods, dyes, or preservatives -pregnant or trying to get pregnant -breast-feeding How should I use this medicine? Take this medicine by mouth. Take it with meals. Swallow the tablets with a drink of water. Follow the directions on the prescription label. Take your medicine at regular intervals. Do not take your medicine more often than directed. Talk to your pediatrician regarding the use of this medicine  in children. While this drug may be prescribed for children as young as 1 years of age for selected conditions, precautions do apply. Overdosage: If you think you have taken too much of this medicine contact a poison control center or emergency room at once. NOTE: This medicine is only for you. Do not  share this medicine with others. What if I miss a dose? If you miss a dose, take it as soon as you can. If it is almost time for your next dose, take only that dose. Do not take double or extra doses. What may interact with this medicine? Do not take this medicine with any of the following medications: -dofetilide -gatifloxacin -certain contrast medicines given before X-rays, CT scans, MRI, or other procedures This medicine may also interact with the following medications: -digoxin -diuretics -male hormones, like estrogens or progestins and birth control pills -isoniazid -medicines for blood pressure, heart disease, irregular heart beat -morphine -nicotinic acid -phenothiazines like chlorpromazine, mesoridazine, prochlorperazine, thioridazine -phenytoin -procainamide -quinidine -quinine -ranitidine -steroid medicines like prednisone or cortisone -stimulant medicines for attention disorders, weight loss, or to stay awake -thyroid medicines -trimethoprim -vancomycin This list may not describe all possible interactions. Give your health care provider a list of all the medicines, herbs, non-prescription drugs, or dietary supplements you use. Also tell them if you smoke, drink alcohol, or use illegal drugs. Some items may interact with your medicine. What should I watch for while using this medicine? Visit your doctor or health care professional for regular checks on your progress. A test called the HbA1C (A1C) will be monitored. This is a simple blood test. It measures your blood sugar control over the last 2 to 3 months. You will receive this test every 3 to 6 months. Learn how to check your blood sugar. Learn the symptoms of low and high blood sugar and how to manage them. Always carry a quick-source of sugar with you in case you have symptoms of low blood sugar. Examples include hard sugar candy or glucose tablets. Make sure others know that you can choke if you eat or drink when you  develop serious symptoms of low blood sugar, such as seizures or unconsciousness. They must get medical help at once. Tell your doctor or health care professional if you have high blood sugar. You might need to change the dose of your medicine. If you are sick or exercising more than usual, you might need to change the dose of your medicine. Do not skip meals. Ask your doctor or health care professional if you should avoid alcohol. Many nonprescription cough and cold products contain sugar or alcohol. These can affect blood sugar. This medicine may cause ovulation in premenopausal women who do not have regular monthly periods. This may increase your chances of becoming pregnant. You should not take this medicine if you become pregnant or think you may be pregnant. Talk with your doctor or health care professional about your birth control options while taking this medicine. Contact your doctor or health care professional right away if think you are pregnant. If you are going to need surgery, a MRI, CT scan, or other procedure, tell your doctor that you are taking this medicine. You may need to stop taking this medicine before the procedure. Wear a medical ID bracelet or chain, and carry a card that describes your disease and details of your medicine and dosage times. What side effects may I notice from receiving this medicine? Side effects that you should report to your  doctor or health care professional as soon as possible: -allergic reactions like skin rash, itching or hives, swelling of the face, lips, or tongue -breathing problems -feeling faint or lightheaded, falls -muscle aches or pains -signs and symptoms of low blood sugar such as feeling anxious, confusion, dizziness, increased hunger, unusually weak or tired, sweating, shakiness, cold, irritable, headache, blurred vision, fast heartbeat, loss of consciousness -slow or irregular heartbeat -unusual stomach pain or discomfort -unusually tired  or weak Side effects that usually do not require medical attention (report to your doctor or health care professional if they continue or are bothersome): -diarrhea -headache -heartburn -metallic taste in mouth -nausea -stomach gas, upset This list may not describe all possible side effects. Call your doctor for medical advice about side effects. You may report side effects to FDA at 1-800-FDA-1088. Where should I keep my medicine? Keep out of the reach of children. Store at room temperature between 15 and 30 degrees C (59 and 86 degrees F). Protect from moisture and light. Throw away any unused medicine after the expiration date. NOTE: This sheet is a summary. It may not cover all possible information. If you have questions about this medicine, talk to your doctor, pharmacist, or health care provider.  2015, Elsevier/[employer] Standard. (2013-03-28 16:03:44)

## 2015-01-28 NOTE — Progress Notes (Signed)
Patient ID: Juan [name], male   DOB: 01/16/1951, 64 y.o.   MRN: [id number]    Subjective:   Patient ID: Juan [name] male    DOB: 02/09/1951 64 y.o.    MRN: [id number] Health Maintenance Due: Health Maintenance Due  Topic Date Due  . OPHTHALMOLOGY EXAM  03/29/1960  . TETANUS/TDAP  03/29/1969    _________________________________________________  HPI: Juan [name] is a 64 y.o. male here for a routine visit.  Pt has a PMH outlined below.  Please see problem-based charting assessment and plan note for further details of medical issues addressed at today's visit.  PMH: Past Medical History  Diagnosis Date  . Hyperlipemia   . Stroke 06/2013  . Carotid artery occlusion     Medications: No current outpatient prescriptions on file prior to visit.   No current facility-administered medications on file prior to visit.    Allergies: No Known Allergies  FH: No family history on file.  SH: History   Social History  . Marital Status: Married    Spouse Name: N/A    Number of Children: N/A  . Years of Education: N/A   Social History Main Topics  . Smoking status: Former Smoker    Quit date: 12/28/2005  . Smokeless tobacco: Former NeurosurgeonUser  . Alcohol Use: No     Comment: a few drinks occasionally   . Drug Use: No  . Sexual Activity: None   Other Topics Concern  . None   Social History Narrative   Lives at home with wife, son, and grandchildren.    Review of Systems: Constitutional: Negative for fever, chills and weight loss.  Eyes: Negative for blurred vision.  Respiratory: Negative for cough and shortness of breath.  Cardiovascular: Negative for chest pain, palpitations and leg swelling.  Gastrointestinal: Negative for nausea, vomiting, abdominal pain, diarrhea, constipation and blood in stool.  Genitourinary: Negative for dysuria, urgency and frequency.  Musculoskeletal: Negative for myalgias and back pain.  Neurological: Negative for dizziness, weakness and headaches.      Objective:   Vital Signs: Filed Vitals:   01/28/15 1529  BP: 144/78  Pulse: 88  Temp: 98 F (36.7 C)  TempSrc: Oral  Height: 5\' 5"  (1.651 m)  Weight: 157 lb 14.4 oz (71.623 kg)  SpO2: 98%     BP Readings from Last 3 Encounters:  01/28/15 144/78  10/25/14 159/92  02/18/14 155/98    Physical Exam: Constitutional: Vital signs reviewed.  Patient is well-developed and well-nourished in NAD and cooperative with exam.  Head: Normocephalic and atraumatic. Eyes: PERRL, EOMI, conjunctivae nl, no scleral icterus.  Neck: Supple. Cardiovascular: RRR, no MRG. Pulmonary/Chest: normal effort, CTAB, no wheezes, rales, or rhonchi. Abdominal: Soft. NT/ND +BS. Neurological: A&O x3, cranial nerves II-XII are grossly intact, moving all extremities. Extremities: 2+DP b/l; no pitting edema. Skin: Warm, dry and intact. No rash.   Assessment & Plan:   Assessment and plan was discussed and formulated with my attending.

## 2015-01-28 NOTE — Progress Notes (Signed)
Case discussed with Dr. Gill at the time of the visit.  We reviewed the resident's history and exam and pertinent patient test results.  I agree with the assessment, diagnosis, and plan of care documented in the resident's note. 

## 2015-01-28 NOTE — Assessment & Plan Note (Signed)
Hemoccult cards neg x 3 in 10/2014.   -retinal scan deferred until April when Medicaid eligible -colonoscopy deferred until April when Medicaid eligible

## 2015-01-29 NOTE — Assessment & Plan Note (Signed)
Lab Results  Component Value Date   HGBA1C 8.5 01/28/2015   Pt reports running out of metformin and speaks very little English although his son speaks some and interprets.   -refilled metformin for 500mg  bid to titrate up as needed -return in 3 months to recheck HA1c -reminded patient to call the clinic if he runs out of meds in the future

## 2015-01-29 NOTE — Assessment & Plan Note (Signed)
Reports non-compliance with lisinopril. -refilled lisinopril and reminded him the importance of good BP control

## 2015-08-12 ENCOUNTER — Ambulatory Visit (INDEPENDENT_AMBULATORY_CARE_PROVIDER_SITE_OTHER): Payer: PPO | Admitting: Internal Medicine

## 2015-08-12 ENCOUNTER — Encounter: Payer: Self-pay | Admitting: Internal Medicine

## 2015-08-12 VITALS — BP 178/85 | HR 73 | Temp 98.4°F | Ht 65.0 in | Wt 158.3 lb

## 2015-08-12 DIAGNOSIS — Z23 Encounter for immunization: Secondary | ICD-10-CM

## 2015-08-12 DIAGNOSIS — Z79899 Other long term (current) drug therapy: Secondary | ICD-10-CM

## 2015-08-12 DIAGNOSIS — E118 Type 2 diabetes mellitus with unspecified complications: Secondary | ICD-10-CM

## 2015-08-12 DIAGNOSIS — E785 Hyperlipidemia, unspecified: Secondary | ICD-10-CM

## 2015-08-12 DIAGNOSIS — E1169 Type 2 diabetes mellitus with other specified complication: Secondary | ICD-10-CM

## 2015-08-12 DIAGNOSIS — E1165 Type 2 diabetes mellitus with hyperglycemia: Secondary | ICD-10-CM

## 2015-08-12 DIAGNOSIS — Z0000 Encounter for general adult medical examination without abnormal findings: Secondary | ICD-10-CM

## 2015-08-12 DIAGNOSIS — Z8673 Personal history of transient ischemic attack (TIA), and cerebral infarction without residual deficits: Secondary | ICD-10-CM

## 2015-08-12 DIAGNOSIS — I63239 Cerebral infarction due to unspecified occlusion or stenosis of unspecified carotid arteries: Secondary | ICD-10-CM

## 2015-08-12 DIAGNOSIS — IMO0002 Reserved for concepts with insufficient information to code with codable children: Secondary | ICD-10-CM

## 2015-08-12 DIAGNOSIS — I10 Essential (primary) hypertension: Secondary | ICD-10-CM | POA: Diagnosis not present

## 2015-08-12 DIAGNOSIS — Z9889 Other specified postprocedural states: Secondary | ICD-10-CM

## 2015-08-12 LAB — GLUCOSE, CAPILLARY: GLUCOSE-CAPILLARY: 140 mg/dL — AB (ref 65–99)

## 2015-08-12 LAB — POCT GLYCOSYLATED HEMOGLOBIN (HGB A1C): Hemoglobin A1C: 8.7

## 2015-08-12 MED ORDER — LISINOPRIL 20 MG PO TABS: 20.0000 mg | ORAL_TABLET | Freq: Every day | ORAL | Status: DC

## 2015-08-12 MED ORDER — METFORMIN HCL 500 MG PO TABS: 1000.0000 mg | ORAL_TABLET | Freq: Two times a day (BID) | ORAL | Status: DC

## 2015-08-12 NOTE — Progress Notes (Signed)
Patient ID: Juan [name], male   DOB: 1951-09-05, 64 y.o.   MRN: [id number]     Subjective:   Patient ID: Juan [name] male    DOB: [birth year] 64 y.o.    MRN: [id number] Health Maintenance Due: Health Maintenance Due  Topic Date Due  . Hepatitis C Screening  1951-01-11  . OPHTHALMOLOGY EXAM  03/29/1960  . TETANUS/TDAP  03/29/1969  . HEMOGLOBIN A1C  04/28/2015  . INFLUENZA VACCINE  07/29/2015    _________________________________________________  HPI: Juan [name] is a 64 y.o. male here for a routine visit.  Pt has a PMH outlined below.  Please see problem-based charting assessment and plan for further details of medical issues addressed at today's visit.  PMH: Past Medical History  Diagnosis Date  . Hyperlipemia   . Stroke 06/2013  . Carotid artery occlusion     Medications: Current Outpatient Prescriptions on File Prior to Visit  Medication Sig Dispense Refill  . aspirin 81 MG EC tablet Take 1 tablet (81 mg total) by mouth daily. 30 tablet 6  . atorvastatin (LIPITOR) 80 MG tablet Take 1 tablet (80 mg total) by mouth daily. 30 tablet 6   No current facility-administered medications on file prior to visit.    Allergies: No Known Allergies  FH: No family history on file.  SH: Social History   Social History  . Marital Status: Married    Spouse Name: N/A  . Number of Children: N/A  . Years of Education: N/A   Social History Main Topics  . Smoking status: Former Smoker    Quit date: 12/28/2005  . Smokeless tobacco: Former Neurosurgeon  . Alcohol Use: No     Comment: a few drinks occasionally   . Drug Use: No  . Sexual Activity: Not Asked   Other Topics Concern  . None   Social History Narrative   Lives at home with wife, son, and grandchildren.    Review of Systems: Constitutional: Negative for fever, chills and weight loss.  Eyes: Negative for blurred vision.  Respiratory: Negative for cough and shortness of breath.  Cardiovascular: Negative for chest pain,  palpitations and leg swelling.  Gastrointestinal: Negative for nausea, vomiting, abdominal pain, diarrhea, +occasional constipation and neg blood in stool.  Genitourinary: Negative for dysuria, urgency and frequency.  Musculoskeletal: Negative for myalgias and back pain.  Neurological: Negative for dizziness, weakness and headaches.     Objective:   Vital Signs: Filed Vitals:   08/12/15 1526  BP: 178/85  Pulse: 73  Temp: 98.4 F (36.9 C)  TempSrc: Oral  Height:  (1.651 m)  Weight: 158 lb 4.8 oz (71.804 kg)  SpO2: 98%      BP Readings from Last 3 Encounters:  08/12/15 178/85  01/28/15 144/78  10/25/14 159/92    Physical Exam: Constitutional: Vital signs reviewed.  Patient is in NAD and cooperative with exam.  Head: Normocephalic and atraumatic. Eyes: EOMI, conjunctivae nl, no scleral icterus.  Neck: Supple. Left anterior cervical surgical scar.  Cardiovascular: RRR, no MRG. Pulmonary/Chest: normal effort, CTAB, no wheezes, rales, or rhonchi. Abdominal: Soft. NT/ND +BS. Neurological: A&O x3, cranial nerves II-XII are grossly intact, moving all extremities. Extremities: 2+DP b/l; no pitting edema. Skin: Warm, dry and intact. No rash.   Assessment & Plan:   Assessment and plan was discussed and formulated with my attending.

## 2015-08-12 NOTE — Patient Instructions (Addendum)
Thank you for your visit today.   Please return to the internal medicine clinic in 1 week or sooner if needed.  We need to recheck your blood pressure.    I have made the following additions/changes to your medications:  Increase lisinopril to 20mg  daily.   Increase metformin to 1000mg  twice daily with meals.   Please be sure to bring all of your medications with you to every visit; this includes herbal supplements, vitamins, eye drops, and any over-the-counter medications.   Should you have any questions regarding your medications and/or any new or worsening symptoms, please be sure to call the clinic at [phone].   If you believe that you are suffering from a life threatening condition or one that may result in the loss of limb or function, then you should call 911 and proceed to the nearest Emergency Department.  DASH Eating Plan DASH stands for "Dietary Approaches to Stop Hypertension." The DASH eating plan is a healthy eating plan that has been shown to reduce high blood pressure (hypertension). Additional health benefits may include reducing the risk of type 2 diabetes mellitus, heart disease, and stroke. The DASH eating plan may also help with weight loss. WHAT DO I NEED TO KNOW ABOUT THE DASH EATING PLAN? For the DASH eating plan, you will follow these general guidelines:  Choose foods with a percent daily value for sodium of less than 5% (as listed on the food label).  Use salt-free seasonings or herbs instead of table salt or sea salt.  Check with your health care provider or pharmacist before using salt substitutes.  Eat lower-sodium products, often labeled as "lower sodium" or "no salt added."  Eat fresh foods.  Eat more vegetables, fruits, and low-fat dairy products.  Choose whole grains. Look for the word "whole" as the first word in the ingredient list.  Choose fish and skinless chicken or [place] more often than red meat. Limit fish, poultry, and meat to 6 oz (170  g) each day.  Limit sweets, desserts, sugars, and sugary drinks.  Choose heart-healthy fats.  Limit cheese to 1 oz (28 g) per day.  Eat more home-cooked food and less restaurant, buffet, and fast food.  Limit fried foods.  Cook foods using methods other than frying.  Limit canned vegetables. If you do use them, rinse them well to decrease the sodium.  When eating at a restaurant, ask that your food be prepared with less salt, or no salt if possible. WHAT FOODS CAN I EAT? Seek help from a dietitian for individual calorie needs. Grains Whole grain or whole wheat bread. Brown rice. Whole grain or whole wheat pasta. Quinoa, bulgur, and whole grain cereals. Low-sodium cereals. Corn or whole wheat flour tortillas. Whole grain cornbread. Whole grain crackers. Low-sodium crackers. Vegetables Fresh or frozen vegetables (raw, steamed, roasted, or grilled). Low-sodium or reduced-sodium tomato and vegetable juices. Low-sodium or reduced-sodium tomato sauce and paste. Low-sodium or reduced-sodium canned vegetables.  Fruits All fresh, canned (in natural juice), or frozen fruits. Meat and Other Protein Products Ground beef (85% or leaner), grass-fed beef, or beef trimmed of fat. Skinless chicken or [place]. Ground chicken or [place]. Pork trimmed of fat. All fish and seafood. Eggs. Dried beans, peas, or lentils. Unsalted nuts and seeds. Unsalted canned beans. Dairy Low-fat dairy products, such as skim or 1% milk, 2% or reduced-fat cheeses, low-fat ricotta or cottage cheese, or plain low-fat yogurt. Low-sodium or reduced-sodium cheeses. Fats and Oils Tub margarines without trans fats. Light or reduced-fat  mayonnaise and salad dressings (reduced sodium). Avocado. Safflower, olive, or canola oils. Natural peanut or almond butter. Other Unsalted popcorn and pretzels. The items listed above may not be a complete list of recommended foods or beverages. Contact your dietitian for more options. WHAT FOODS  ARE NOT RECOMMENDED? Grains White bread. White pasta. White rice. Refined cornbread. Bagels and croissants. Crackers that contain trans fat. Vegetables Creamed or fried vegetables. Vegetables in a cheese sauce. Regular canned vegetables. Regular canned tomato sauce and paste. Regular tomato and vegetable juices. Fruits Dried fruits. Canned fruit in light or heavy syrup. Fruit juice. Meat and Other Protein Products Fatty cuts of meat. Ribs, chicken wings, bacon, sausage, bologna, salami, chitterlings, fatback, hot dogs, bratwurst, and packaged luncheon meats. Salted nuts and seeds. Canned beans with salt. Dairy Whole or 2% milk, cream, half-and-half, and cream cheese. Whole-fat or sweetened yogurt. Full-fat cheeses or blue cheese. Nondairy creamers and whipped toppings. Processed cheese, cheese spreads, or cheese curds. Condiments Onion and garlic salt, seasoned salt, table salt, and sea salt. Canned and packaged gravies. Worcestershire sauce. Tartar sauce. Barbecue sauce. Teriyaki sauce. Soy sauce, including reduced sodium. Steak sauce. Fish sauce. Oyster sauce. Cocktail sauce. Horseradish. Ketchup and mustard. Meat flavorings and tenderizers. Bouillon cubes. Hot sauce. Tabasco sauce. Marinades. Taco seasonings. Relishes. Fats and Oils Butter, stick margarine, lard, shortening, ghee, and bacon fat. Coconut, palm kernel, or palm oils. Regular salad dressings. Other Pickles and olives. Salted popcorn and pretzels. The items listed above may not be a complete list of foods and beverages to avoid. Contact your dietitian for more information. WHERE CAN I FIND MORE INFORMATION? National Heart, Lung, and Blood Institute: [employer] Document Released: 12/03/2011 Document Revised: 04/30/2014 Document Reviewed: 10/18/2013 [facility] Patient Information 2015 [place], [place]. This information is not intended to replace advice given to you by your health care provider.  Make sure you discuss any questions you have with your health care provider.

## 2015-08-12 NOTE — Assessment & Plan Note (Signed)
-  cont atorvastatin

## 2015-08-12 NOTE — Assessment & Plan Note (Signed)
Pt had left endarterectomy by Dr. Arbie Cookey and was supposed to f/u with right endarterectomy but has not.   -referral back to Dr. Arbie Cookey  -cont current mgmt

## 2015-08-12 NOTE — Assessment & Plan Note (Addendum)
BP 178/85.  Reports being out of meds for the past month.  Reminded him to let us know when he is out of meds and will be happy to refill.  -increase lisinopril to  daily  -provided information on DASH diet -f/u in 1 week for BP recheck, will also need BMP (since going up on lisinopril)

## 2015-08-13 LAB — CBC WITH DIFFERENTIAL/PLATELET
BASOS ABS: 0.1 10*3/uL (ref 0.0–0.2)
BASOS: 1 %
EOS (ABSOLUTE): 1.9 10*3/uL — ABNORMAL HIGH (ref 0.0–0.4)
EOS: 19 %
HEMATOCRIT: 44.9 % (ref 37.5–51.0)
HEMOGLOBIN: 14.8 g/dL (ref 12.6–17.7)
Immature Grans (Abs): 0 10*3/uL (ref 0.0–0.1)
Immature Granulocytes: 0 %
LYMPHS ABS: 3.2 10*3/uL — AB (ref 0.7–3.1)
Lymphs: 33 %
MCH: 25.7 pg — AB (ref 26.6–33.0)
MCHC: 33 g/dL (ref 31.5–35.7)
MCV: 78 fL — AB (ref 79–97)
MONOCYTES: 7 %
Monocytes Absolute: 0.7 10*3/uL (ref 0.1–0.9)
NEUTROS ABS: 3.9 10*3/uL (ref 1.4–7.0)
Neutrophils: 40 %
Platelets: 228 10*3/uL (ref 150–379)
RBC: 5.75 x10E6/uL (ref 4.14–5.80)
RDW: 14.5 % (ref 12.3–15.4)
WBC: 9.8 10*3/uL (ref 3.4–10.8)

## 2015-08-13 LAB — BASIC METABOLIC PANEL
BUN / CREAT RATIO: 15 (ref 10–22)
BUN: 17 mg/dL (ref 8–27)
CO2: 25 mmol/L (ref 18–29)
CREATININE: 1.12 mg/dL (ref 0.76–1.27)
Calcium: 9.6 mg/dL (ref 8.6–10.2)
Chloride: 95 mmol/L — ABNORMAL LOW (ref 97–108)
GFR, EST AFRICAN AMERICAN: 79 mL/min/{1.73_m2} (ref >59)
GFR, EST NON AFRICAN AMERICAN: 69 mL/min/{1.73_m2} (ref >59)
Glucose: 152 mg/dL — ABNORMAL HIGH (ref 65–99)
Potassium: 4.4 mmol/L (ref 3.5–5.2)
SODIUM: 136 mmol/L (ref 134–144)

## 2015-08-13 NOTE — Addendum Note (Signed)
Addended by: Marrian Salvage on: 08/13/2015 05:19 PM   Modules accepted: Orders

## 2015-08-13 NOTE — Assessment & Plan Note (Signed)
-  prevnar given today -referral to GI for colonoscopy  -will get retinal scan at next OV Lupita Leash is out today)

## 2015-08-13 NOTE — Assessment & Plan Note (Addendum)
Lab Results  Component Value Date   HGBA1C 8.7 08/12/2015   -will increase metformin to  bid (previously taking  bid) -will obtain retinal scan at next OV (when f/u for BP recheck)  -f/u in 1 month

## 2015-08-14 NOTE — Progress Notes (Signed)
Internal Medicine Clinic Attending  Case discussed with Dr. Gill soon after the resident saw the patient.  We reviewed the resident's history and exam and pertinent patient test results.  I agree with the assessment, diagnosis, and plan of care documented in the resident's note.  

## 2015-08-19 ENCOUNTER — Ambulatory Visit (INDEPENDENT_AMBULATORY_CARE_PROVIDER_SITE_OTHER): Payer: PPO | Admitting: Internal Medicine

## 2015-08-19 ENCOUNTER — Encounter: Payer: Self-pay | Admitting: Internal Medicine

## 2015-08-19 VITALS — BP 147/88 | HR 73 | Temp 98.5°F | Ht 65.0 in | Wt 155.7 lb

## 2015-08-19 DIAGNOSIS — E119 Type 2 diabetes mellitus without complications: Secondary | ICD-10-CM

## 2015-08-19 DIAGNOSIS — I10 Essential (primary) hypertension: Secondary | ICD-10-CM

## 2015-08-19 DIAGNOSIS — Z8673 Personal history of transient ischemic attack (TIA), and cerebral infarction without residual deficits: Secondary | ICD-10-CM

## 2015-08-19 DIAGNOSIS — E1165 Type 2 diabetes mellitus with hyperglycemia: Secondary | ICD-10-CM | POA: Diagnosis not present

## 2015-08-19 DIAGNOSIS — Z7982 Long term (current) use of aspirin: Secondary | ICD-10-CM

## 2015-08-19 DIAGNOSIS — E118 Type 2 diabetes mellitus with unspecified complications: Principal | ICD-10-CM

## 2015-08-19 DIAGNOSIS — I63239 Cerebral infarction due to unspecified occlusion or stenosis of unspecified carotid arteries: Secondary | ICD-10-CM

## 2015-08-19 DIAGNOSIS — Z0000 Encounter for general adult medical examination without abnormal findings: Secondary | ICD-10-CM

## 2015-08-19 DIAGNOSIS — Z79899 Other long term (current) drug therapy: Secondary | ICD-10-CM

## 2015-08-19 DIAGNOSIS — IMO0002 Reserved for concepts with insufficient information to code with codable children: Secondary | ICD-10-CM

## 2015-08-19 LAB — HM DIABETES EYE EXAM

## 2015-08-19 MED ORDER — METFORMIN HCL 1000 MG PO TABS: 1000.0000 mg | ORAL_TABLET | Freq: Two times a day (BID) | ORAL | Status: DC

## 2015-08-19 NOTE — Assessment & Plan Note (Signed)
BP much improved today. -cont lisinopril   -check BMP today

## 2015-08-19 NOTE — Progress Notes (Signed)
Patient ID: Juan [name], male   DOB: 1951/09/01, 64 y.o.   MRN: [id number]     Subjective:   Patient ID: Juan [name] male    DOB: 30-Apr-1951 64 y.o.    MRN: [id number] Health Maintenance Due: Health Maintenance Due  Topic Date Due  . Hepatitis C Screening  10/20/50  . OPHTHALMOLOGY EXAM  03/29/1960  . TETANUS/TDAP  03/29/1969  . INFLUENZA VACCINE  07/29/2015    _________________________________________________  HPI: Juan [name] is a 64 y.o. male here for a routine f/u visit for BP.  Pt has a PMH outlined below.  Please see problem-based charting assessment and plan for further details of medical issues addressed at today's visit.  PMH: Past Medical History  Diagnosis Date  . Hyperlipemia   . Stroke 06/2013  . Carotid artery occlusion     Medications: Current Outpatient Prescriptions on File Prior to Visit  Medication Sig Dispense Refill  . aspirin 81 MG EC tablet Take 1 tablet (81 mg total) by mouth daily. 30 tablet 6  . atorvastatin (LIPITOR) 80 MG tablet Take 1 tablet (80 mg total) by mouth daily. 30 tablet 6  . lisinopril (PRINIVIL,ZESTRIL) 20 MG tablet Take 1 tablet (20 mg total) by mouth daily. 30 tablet 3   No current facility-administered medications on file prior to visit.    Allergies: No Known Allergies  FH: No family history on file.  SH: Social History   Social History  . Marital Status: Married    Spouse Name: N/A  . Number of Children: N/A  . Years of Education: N/A   Social History Main Topics  . Smoking status: Former Smoker    Quit date: 12/28/2005  . Smokeless tobacco: Former Neurosurgeon  . Alcohol Use: No     Comment: a few drinks occasionally   . Drug Use: No  . Sexual Activity: Not Asked   Other Topics Concern  . None   Social History Narrative   Lives at home with wife, son, and grandchildren.    Review of Systems: Constitutional: Negative for fever, chills and weight loss.  Eyes: Negative for blurred vision.  Respiratory: Negative  for cough and shortness of breath.  Cardiovascular: Negative for chest pain, palpitations and leg swelling.  Gastrointestinal: Negative for nausea, vomiting, abdominal pain, diarrhea, constipation and blood in stool.  Genitourinary: Negative for dysuria, urgency and frequency.  Musculoskeletal: Negative for myalgias and back pain.  Neurological: Negative for dizziness, +leg weakness and headaches.     Objective:   Vital Signs: Filed Vitals:   08/19/15 1528  BP: 147/88  Pulse: 73  Temp: 98.5 F (36.9 C)  TempSrc: Oral  Height:  (1.651 m)  Weight: 155 lb 11.2 oz (70.625 kg)  SpO2: 96%      BP Readings from Last 3 Encounters:  08/19/15 147/88  08/12/15 178/85  01/28/15 144/78    Physical Exam: Constitutional: Vital signs reviewed.  Patient is in NAD and cooperative with exam.  Head: Normocephalic and atraumatic. Eyes: EOMI, conjunctivae nl, no scleral icterus.  Neck: Supple. L surgical scar.  Cardiovascular: RRR, no MRG. Pulmonary/Chest: normal effort, CTAB, no wheezes, rales, or rhonchi. Abdominal: Soft. NT/ND +BS. Neurological: A&O x3, cranial nerves II-XII are grossly intact, moving all extremities. Extremities: 2+DP b/l; no pitting edema. Skin: Warm, dry and intact. No rash.   Assessment & Plan:   Assessment and plan was discussed and formulated with my attending.

## 2015-08-19 NOTE — Patient Instructions (Signed)
Thank you for your visit today.   Please return to the internal medicine clinic in 3 month(s) or sooner if needed.     I have made the following additions/changes to your medications:  Continue current medications.  Please make sure to take metformin with meals as this will help with any side effects.  We will check your eyes today.  We will refer you back to Dr. Arbie Cookey.   Please be sure to bring all of your medications with you to every visit; this includes herbal supplements, vitamins, eye drops, and any over-the-counter medications.   Should you have any questions regarding your medications and/or any new or worsening symptoms, please be sure to call the clinic at [phone].   If you believe that you are suffering from a life threatening condition or one that may result in the loss of limb or function, then you should call 911 and proceed to the nearest Emergency Department.

## 2015-08-20 LAB — BASIC METABOLIC PANEL
BUN / CREAT RATIO: 18 (ref 10–22)
BUN: 22 mg/dL (ref 8–27)
CALCIUM: 9.7 mg/dL (ref 8.6–10.2)
CO2: 25 mmol/L (ref 18–29)
Chloride: 90 mmol/L — ABNORMAL LOW (ref 97–108)
Creatinine, Ser: 1.23 mg/dL (ref 0.76–1.27)
GFR, EST AFRICAN AMERICAN: 71 mL/min/{1.73_m2} (ref >59)
GFR, EST NON AFRICAN AMERICAN: 61 mL/min/{1.73_m2} (ref >59)
Glucose: 272 mg/dL — ABNORMAL HIGH (ref 65–99)
POTASSIUM: 5.3 mmol/L — AB (ref 3.5–5.2)
Sodium: 133 mmol/L — ABNORMAL LOW (ref 134–144)

## 2015-08-23 ENCOUNTER — Encounter: Payer: Self-pay | Admitting: Dietician

## 2015-08-23 NOTE — Assessment & Plan Note (Addendum)
-  prevnar today, still needs pneumovax in ~1 yr -retinal scan today -referral to GI for colonoscopy at last OV -pt request a handicap placard for LE weakness (possibly d/t stroke?), granted for 6 months

## 2015-08-23 NOTE — Assessment & Plan Note (Signed)
Pt reports some GI upset from the increased dose of metformin to  bid.  I encouraged him to take with food and to give it a try that the side effects may go away with time.  If not, we can always add glipizide and keep the metformin at the lower dose. -cont metformin for now, but consider decreasing dose at next OV and added glipizide if still experiencing SE -f/u in 3 months

## 2015-08-23 NOTE — Assessment & Plan Note (Addendum)
-  refered back to Dr. Arbie Cookey on last OV -cont ASA

## 2015-08-23 NOTE — Progress Notes (Signed)
Internal Medicine Clinic Attending  Case discussed with Dr. Gill soon after the resident saw the patient.  We reviewed the resident's history and exam and pertinent patient test results.  I agree with the assessment, diagnosis, and plan of care documented in the resident's note.  

## 2015-08-30 ENCOUNTER — Other Ambulatory Visit: Payer: Self-pay

## 2015-08-30 DIAGNOSIS — I6523 Occlusion and stenosis of bilateral carotid arteries: Secondary | ICD-10-CM

## 2015-09-10 ENCOUNTER — Other Ambulatory Visit: Payer: Self-pay | Admitting: Vascular Surgery

## 2015-09-10 ENCOUNTER — Ambulatory Visit (HOSPITAL_COMMUNITY)
Admission: RE | Admit: 2015-09-10 | Discharge: 2015-09-10 | Disposition: A | Discharge status: Home / Self Care | Payer: PPO | Source: Ambulatory Visit | Attending: Vascular Surgery | Admitting: Vascular Surgery

## 2015-09-10 DIAGNOSIS — I6521 Occlusion and stenosis of right carotid artery: Secondary | ICD-10-CM | POA: Diagnosis not present

## 2015-09-10 DIAGNOSIS — Z48812 Encounter for surgical aftercare following surgery on the circulatory system: Secondary | ICD-10-CM | POA: Diagnosis not present

## 2015-09-10 DIAGNOSIS — I6523 Occlusion and stenosis of bilateral carotid arteries: Secondary | ICD-10-CM | POA: Diagnosis not present

## 2015-09-16 ENCOUNTER — Encounter: Payer: Self-pay | Admitting: Vascular Surgery

## 2015-09-17 ENCOUNTER — Ambulatory Visit: Payer: PPO | Admitting: Vascular Surgery

## 2015-10-07 ENCOUNTER — Encounter: Payer: Self-pay | Admitting: Vascular Surgery

## 2015-10-08 ENCOUNTER — Ambulatory Visit: Payer: PPO | Admitting: Vascular Surgery

## 2015-10-10 ENCOUNTER — Encounter: Payer: Self-pay | Admitting: Vascular Surgery

## 2015-10-15 ENCOUNTER — Ambulatory Visit (INDEPENDENT_AMBULATORY_CARE_PROVIDER_SITE_OTHER): Payer: PPO | Admitting: Vascular Surgery

## 2015-10-15 ENCOUNTER — Encounter: Payer: Self-pay | Admitting: Vascular Surgery

## 2015-10-15 ENCOUNTER — Other Ambulatory Visit: Payer: Self-pay

## 2015-10-15 VITALS — BP 121/67 | HR 70 | Temp 97.8°F | Resp 18 | Ht 65.0 in | Wt 156.8 lb

## 2015-10-15 DIAGNOSIS — I6523 Occlusion and stenosis of bilateral carotid arteries: Secondary | ICD-10-CM

## 2015-10-15 NOTE — Progress Notes (Signed)
Patient name: Juan [name] MRN: [id number] DOB: 05/29/1951 Sex: male   Referred by: Delane Ginger  Reason for referral:  Chief Complaint  Patient presents with  . Follow-up    carotid duplex at [facility] 09-10-2015  referred by Dr Delane Ginger    . Carotid    HISTORY OF PRESENT ILLNESS: Patient is a 64 year old gentleman well known to me from prior left carotid endarterectomy. He presented in July 2014 with a left brain stroke. Workup at that time included CT angiogram of his neck showing severe bilateral carotid stenoses. He underwent uneventful left carotid endarterectomy. Last seen him approximately 4 months postoperative and I have recommended right endarterectomy due to his high-grade right carotid stenosis. He did not follow-up. He is here today for continued discussion. Fortunately has had no right brain events. He does continue to have numbness and clumsiness in his right hand from his pre-operative stroke. He also has no difficulty in walking. No history of cardiac disease. He does speak a fair [place] and is here today with a [place] interpreter.  Past Medical History  Diagnosis Date  . Hyperlipemia   . Stroke (HCC) 06/2013  . Carotid artery occlusion   . Diabetes mellitus without complication [facility])     Past Surgical History  Procedure Laterality Date  . Neck surgery  2003    Cord Compression  . Tee without cardioversion N/A 07/20/2013    Procedure: TRANSESOPHAGEAL ECHOCARDIOGRAM (TEE);  Surgeon: Wendall Stade, MD;  Location[facility] ENDOSCOPY;  Service: Cardiovascular;  Laterality: N/A;  . Endarterectomy Left 08/04/2013    Procedure: Left Carotid Endarterectomy with hemashield patch angioplasty;  Surgeon: Larina Earthly, MD;  Location: [facility] OR;  Service: Vascular;  Laterality: Left;  . Carotid endarterectomy Left 08-04-13    cea    Social History   Social History  . Marital Status: Married    Spouse Name: N/A  . Number of Children: N/A  . Years of Education: N/A   Occupational History  . Not  on file.   Social History Main Topics  . Smoking status: Former Smoker    Quit date: 12/28/2005  . Smokeless tobacco: Former Neurosurgeon  . Alcohol Use: No     Comment: a few drinks occasionally   . Drug Use: No  . Sexual Activity: Not on file   Other Topics Concern  . Not on file   Social History Narrative   Lives at home with wife, son, and grandchildren.    History reviewed. No pertinent family history.  Allergies as of 10/15/2015  . (No Known Allergies)    Current Outpatient Prescriptions on File Prior to Visit  Medication Sig Dispense Refill  . aspirin 81 MG EC tablet Take 1 tablet (81 mg total) by mouth daily. 30 tablet 6  . atorvastatin (LIPITOR) 80 MG tablet Take 1 tablet (80 mg total) by mouth daily. 30 tablet 6  . lisinopril (PRINIVIL,ZESTRIL) 20 MG tablet Take 1 tablet (20 mg total) by mouth daily. 30 tablet 3  . metFORMIN (GLUCOPHAGE) 1000 MG tablet Take 1 tablet (1,000 mg total) by mouth 2 (two) times daily with a meal. 60 tablet 3   No current facility-administered medications on file prior to visit.     REVIEW OF SYSTEMS:  Positives indicated with an "X"  CARDIOVASCULAR:   chest pain    chest pressure    palpitations    orthopnea    dyspnea on exertion    claudication     rest pain   [ ]  DVT   [ ]  phlebitis PULMONARY:   [ ]  productive cough   [ ]  asthma   [ ]  wheezing NEUROLOGIC:   [ ]  weakness  [ ]  paresthesias  [ ]  aphasia  [ ]  amaurosis  [ ]  dizziness HEMATOLOGIC:   [ ]  bleeding problems   [ ]  clotting disorders MUSCULOSKELETAL:  [ ]  joint pain   [ ]  joint swelling GASTROINTESTINAL: [ ]   blood in stool  [ ]   hematemesis GENITOURINARY:  [ ]   dysuria  [ ]   hematuria PSYCHIATRIC:  [ ]  history of major depression INTEGUMENTARY:  [ ]  rashes  [ ]  ulcers CONSTITUTIONAL:  [ ]  fever   [ ]  chills  PHYSICAL EXAMINATION:  General: The patient is a well-nourished male, in no acute distress. Vital signs are BP 121/67 mmHg  Pulse 70  Temp(Src)  97.8 F (36.6 C) (Oral)  Resp 18  Ht 5\' 5"  (1.651 m)  Wt 156 lb 12.8 oz (71.124 kg)  BMI 26.09 kg/m2  SpO2 100% Pulmonary: There is a good air exchange bilaterally without wheezing or rales. Abdomen: Soft and non-tender  Musculoskeletal: There are no major deformities.  There is no significant extremity pain. Neurologic: Markedly diminished grip strength on the right hand. Normal on the right leg Skin: There are no ulcer or rashes noted. Psychiatric: The patient has normal affect. Cardiovascular: There is a regular rate and rhythm without significant murmur appreciated. Left carotid incision is healed with a somewhat hypertrophic scar. No carotid bruits bilaterally. 2+ radial pulses bilaterally  [facility] Vascular Lab Studies:  Ordered and Independently Reviewed the duplex from 09/10/2015 shows widely patent endarterectomy on the left. There is elevated velocity on the right predicted in the 49-59% range.  Impression and Plan:  None high-grade stenosis in his right internal carotid artery by CT angiogram. I did review his studies and is clearly had a very calcified plaque with a very narrowed flow lumen. His the duplex is somewhat underestimates his level of stenosis. I had long discussion with the patient and recommended endarterectomy for reduction of stroke risk. Explained the approximate 5% risk of neurologic deficit for his asymptomatic right carotid stenosis. He understands wished to proceed. We will schedule this at his earliest convenience. Did explain the slight risk of stroke and expected one night hospitalization. He states that he may need 2 nights in the hospital with concern regarding postoperative pain. I explained that we would make this determination on postoperative day 1.    Gretta BeganEarly, Warnie Belair [facility] [place] Office: [phone]

## 2015-10-17 NOTE — Pre-Procedure Instructions (Signed)
Juan [name]  10/17/2015      Your procedure is scheduled on : Monday, October 24.  Report to [facility] at 5:30 A.M.                Your surgery is scheduled  for 7:30 A.M  Call this number if you have problems the morning of surgery:[phone]               For any other questions, please call [phone], Monday - Friday 8 AM - 4 PM.   Remember:  Do not eat food or drink liquids after midnight Sunday, October 23.  Take these medicines the morning of surgery with A SIP OF WATER :None              Do not take oral diabetes medicines (pills) the morning of surgery.                 How to Manage Your Diabetes Before Surgery   Why is it important to control my blood sugar before and after surgery?   Improving blood sugar levels before and after surgery helps healing and can limit problems.  A way of improving blood sugar control is eating a healthy diet by:  - Eating less sugar and carbohydrates  - Increasing activity/exercise  - Talk with your doctor about reaching your blood sugar goals  High blood sugars (greater than 180 mg/dL) can raise your risk of infections and slow down your recovery so you will need to focus on controlling your diabetes during the weeks before surgery.  Make sure that the doctor who takes care of your diabetes knows about your planned surgery including the date and location.  How do I manage my blood sugars before surgery?   Check your blood sugar at least 4 times a day, 2 days before surgery to make sure that they are not too high or low.   Check your blood sugar the morning of your surgery when you wake up and every 2  hours until you get to the Short-Stay unit.  If your blood sugar is less than 70 mg/dL, you will need to treat for low blood sugar by:  Treat a low blood sugar (less than 70 mg/dL) with 1/2 cup of clear juice (cranberry or apple), 4 glucose tablets, OR glucose gel.  Recheck blood sugar in 15 minutes  after treatment (to make sure it is greater than 70 mg/dL).  If blood sugar is not greater than 70 mg/dL on re-check, call [phone] for further instructions .Report your blood sugar to the Short-Stay nurse when you get to Short-Stay    Do not wear jewelry, make-up or nail polish.   Do not wear lotions, powders, or perfumes.     Do not shave 48 hours prior to surgery.    Do not bring valuables to the hospital.   [facility] is not responsible for any belongings or valuables.  Contacts, dentures or bridgework may not be worn into surgery.  Leave your suitcase in the car.  After surgery it may be brought to your room.  For patients admitted to the hospital, discharge time will be determined by your treatment team.  Patients discharged the day of surgery will not be allowed to drive home.   Special instructions: Review  [place] - Preparing For Surgery.  Please read over the following fact sheets that you were given. Pain Booklet, Coughing and Deep Breathing, Blood Transfusion Information and  Surgical Site Infection Prevention

## 2015-10-18 ENCOUNTER — Encounter (HOSPITAL_COMMUNITY)
Admission: RE | Admit: 2015-10-18 | Discharge: 2015-10-18 | Disposition: A | Discharge status: Home / Self Care | Payer: PPO | Source: Ambulatory Visit | Attending: Vascular Surgery | Admitting: Vascular Surgery

## 2015-10-18 ENCOUNTER — Encounter (HOSPITAL_COMMUNITY): Payer: Self-pay

## 2015-10-18 DIAGNOSIS — I10 Essential (primary) hypertension: Secondary | ICD-10-CM | POA: Insufficient documentation

## 2015-10-18 DIAGNOSIS — Z0183 Encounter for blood typing: Secondary | ICD-10-CM | POA: Diagnosis not present

## 2015-10-18 DIAGNOSIS — I498 Other specified cardiac arrhythmias: Secondary | ICD-10-CM | POA: Insufficient documentation

## 2015-10-18 DIAGNOSIS — Z01812 Encounter for preprocedural laboratory examination: Secondary | ICD-10-CM | POA: Diagnosis not present

## 2015-10-18 DIAGNOSIS — Z01818 Encounter for other preprocedural examination: Secondary | ICD-10-CM | POA: Insufficient documentation

## 2015-10-18 LAB — URINALYSIS, ROUTINE W REFLEX MICROSCOPIC
BILIRUBIN URINE: NEGATIVE
Glucose, UA: NEGATIVE mg/dL
HGB URINE DIPSTICK: NEGATIVE
KETONES UR: NEGATIVE mg/dL
Leukocytes, UA: NEGATIVE
NITRITE: NEGATIVE
Protein, ur: NEGATIVE mg/dL
Specific Gravity, Urine: 1.009 (ref 1.005–1.030)
UROBILINOGEN UA: 0.2 mg/dL (ref 0.0–1.0)
pH: 6.5 (ref 5.0–8.0)

## 2015-10-18 LAB — COMPREHENSIVE METABOLIC PANEL
ALBUMIN: 3.7 g/dL (ref 3.5–5.0)
ALT: 22 U/L (ref 17–63)
ANION GAP: 8 (ref 5–15)
AST: 20 U/L (ref 15–41)
Alkaline Phosphatase: 71 U/L (ref 38–126)
BILIRUBIN TOTAL: 0.4 mg/dL (ref 0.3–1.2)
BUN: 15 mg/dL (ref 6–20)
CHLORIDE: 100 mmol/L — AB (ref 101–111)
CO2: 27 mmol/L (ref 22–32)
Calcium: 9.3 mg/dL (ref 8.9–10.3)
Creatinine, Ser: 1.45 mg/dL — ABNORMAL HIGH (ref 0.61–1.24)
GFR calc Af Amer: 57 mL/min — ABNORMAL LOW (ref >60)
GFR calc non Af Amer: 49 mL/min — ABNORMAL LOW (ref >60)
GLUCOSE: 131 mg/dL — AB (ref 65–99)
POTASSIUM: 5 mmol/L (ref 3.5–5.1)
SODIUM: 135 mmol/L (ref 135–145)
Total Protein: 7.7 g/dL (ref 6.5–8.1)

## 2015-10-18 LAB — TYPE AND SCREEN
ABO/RH(D): A POS
ANTIBODY SCREEN: NEGATIVE

## 2015-10-18 LAB — CBC
HEMATOCRIT: 41.4 % (ref 39.0–52.0)
Hemoglobin: 13.4 g/dL (ref 13.0–17.0)
MCH: 25.9 pg — ABNORMAL LOW (ref 26.0–34.0)
MCHC: 32.4 g/dL (ref 30.0–36.0)
MCV: 79.9 fL (ref 78.0–100.0)
PLATELETS: 228 10*3/uL (ref 150–400)
RBC: 5.18 MIL/uL (ref 4.22–5.81)
RDW: 14.5 % (ref 11.5–15.5)
WBC: 11.1 10*3/uL — AB (ref 4.0–10.5)

## 2015-10-18 LAB — APTT: APTT: 28 s (ref 24–37)

## 2015-10-18 LAB — GLUCOSE, CAPILLARY: Glucose-Capillary: 148 mg/dL — ABNORMAL HIGH (ref 65–99)

## 2015-10-18 LAB — PROTIME-INR
INR: 0.94 (ref 0.00–1.49)
Prothrombin Time: 12.8 seconds (ref 11.6–15.2)

## 2015-10-18 LAB — SURGICAL PCR SCREEN
MRSA, PCR: NEGATIVE
Staphylococcus aureus: NEGATIVE

## 2015-10-18 NOTE — Progress Notes (Signed)
PAT interview completed using Bush Ksor, [place]  From Language resourses.  Patient speaks somwe english, but does not read english. Pt's son Lawernce KeasDon Urich was also with patient, [name] does speak and read english.  Mr [name] denies chest pain or shortness of breath, does not see a cardiologist.

## 2015-10-19 LAB — HEMOGLOBIN A1C
Hgb A1c MFr Bld: 8.1 % — ABNORMAL HIGH (ref 4.8–5.6)
Mean Plasma Glucose: 186 mg/dL

## 2015-10-25 ENCOUNTER — Other Ambulatory Visit: Payer: Self-pay | Admitting: *Deleted

## 2015-10-25 DIAGNOSIS — Z01810 Encounter for preprocedural cardiovascular examination: Secondary | ICD-10-CM

## 2015-10-25 DIAGNOSIS — I6523 Occlusion and stenosis of bilateral carotid arteries: Secondary | ICD-10-CM

## 2015-10-28 ENCOUNTER — Encounter (HOSPITAL_COMMUNITY): Admission: RE | Payer: Self-pay | Source: Ambulatory Visit

## 2015-10-28 ENCOUNTER — Inpatient Hospital Stay (HOSPITAL_COMMUNITY): Admission: RE | Admit: 2015-10-28 | Payer: PPO | Source: Ambulatory Visit | Admitting: Vascular Surgery

## 2015-10-28 SURGERY — ENDARTERECTOMY, CAROTID
Anesthesia: General | Laterality: Right

## 2015-11-01 ENCOUNTER — Ambulatory Visit (HOSPITAL_COMMUNITY): Payer: PPO

## 2015-11-01 ENCOUNTER — Ambulatory Visit (HOSPITAL_COMMUNITY)
Admission: RE | Admit: 2015-11-01 | Discharge: 2015-11-01 | Disposition: A | Discharge status: Home / Self Care | Payer: PPO | Source: Ambulatory Visit | Attending: Vascular Surgery | Admitting: Vascular Surgery

## 2015-11-01 ENCOUNTER — Encounter (HOSPITAL_COMMUNITY): Payer: Self-pay

## 2015-11-01 DIAGNOSIS — I6523 Occlusion and stenosis of bilateral carotid arteries: Secondary | ICD-10-CM | POA: Insufficient documentation

## 2015-11-01 DIAGNOSIS — Z01810 Encounter for preprocedural cardiovascular examination: Secondary | ICD-10-CM | POA: Insufficient documentation

## 2015-11-01 HISTORY — DX: Essential (primary) hypertension: I10

## 2015-11-01 MED ORDER — IOHEXOL 350 MG/ML SOLN
50.0000 mL | Freq: Once | INTRAVENOUS | Status: AC | PRN
  Administered 2015-11-01: 50 mL via INTRAVENOUS

## 2015-11-19 ENCOUNTER — Encounter: Payer: Self-pay | Admitting: Vascular Surgery

## 2015-11-19 ENCOUNTER — Ambulatory Visit (INDEPENDENT_AMBULATORY_CARE_PROVIDER_SITE_OTHER): Payer: PPO | Admitting: Internal Medicine

## 2015-11-19 ENCOUNTER — Encounter: Payer: Self-pay | Admitting: Internal Medicine

## 2015-11-19 VITALS — BP 152/81 | HR 66 | Temp 97.9°F | Wt 155.0 lb

## 2015-11-19 DIAGNOSIS — Z7984 Long term (current) use of oral hypoglycemic drugs: Secondary | ICD-10-CM | POA: Diagnosis not present

## 2015-11-19 DIAGNOSIS — E1165 Type 2 diabetes mellitus with hyperglycemia: Secondary | ICD-10-CM | POA: Diagnosis not present

## 2015-11-19 DIAGNOSIS — Z8673 Personal history of transient ischemic attack (TIA), and cerebral infarction without residual deficits: Secondary | ICD-10-CM | POA: Diagnosis not present

## 2015-11-19 DIAGNOSIS — E118 Type 2 diabetes mellitus with unspecified complications: Principal | ICD-10-CM

## 2015-11-19 DIAGNOSIS — I10 Essential (primary) hypertension: Secondary | ICD-10-CM | POA: Diagnosis not present

## 2015-11-19 DIAGNOSIS — I63239 Cerebral infarction due to unspecified occlusion or stenosis of unspecified carotid arteries: Secondary | ICD-10-CM

## 2015-11-19 DIAGNOSIS — IMO0002 Reserved for concepts with insufficient information to code with codable children: Secondary | ICD-10-CM

## 2015-11-19 LAB — GLUCOSE, CAPILLARY: GLUCOSE-CAPILLARY: 125 mg/dL — AB (ref 65–99)

## 2015-11-19 MED ORDER — GLIPIZIDE 5 MG PO TABS: 5.0000 mg | ORAL_TABLET | Freq: Every day | ORAL | Status: DC

## 2015-11-19 NOTE — Assessment & Plan Note (Signed)
Endarterectomy was unfortunately cancelled due to insurance reasons per patient.

## 2015-11-19 NOTE — Assessment & Plan Note (Addendum)
BP Readings from Last 3 Encounters:  11/19/15 152/81  10/18/15 126/60  10/15/15 121/67    Lab Results  Component Value Date   NA 135 10/18/2015   K 5.0 10/18/2015   CREATININE 1.45* 10/18/2015    Assessment: Blood pressure control:   slightly uncontrolled Progress toward BP goal:    Above goal Comments: Compliant with lisinopril 20 mg daily. Previously well controlled. I am unsure why it is slightly elevated today.   Plan: Medications:  continue current medications; if elevated at follow visit, would increase lisinopril given proteinuria and also consider repeat urine microalbumin/creatinine ratio

## 2015-11-19 NOTE — Patient Instructions (Signed)
START TAKING GLIPIZIDE 5 MG (1 PILL) BEFORE BREAKFAST.  CONTINUE TAKING ALL OF YOUR OTHER MEDICATIONS AS PREVIOUSLY PRESCRIBED.   FOLLOW UP IN 2-3 MONTHS FOR YOUR DIABETES.

## 2015-11-19 NOTE — Assessment & Plan Note (Addendum)
Lab Results  Component Value Date   HGBA1C 8.1* 10/18/2015   HGBA1C 8.7 08/12/2015   HGBA1C 8.5 01/28/2015     Assessment: Diabetes control:  Above goal <7 Progress toward A1C goal:   Improved Comments: Patient is on Metformin 1000 BID.   Plan: Medications:  continue current medications; add glipizide 5 mg daily Instruction/counseling given: discussed diet Other plans: Follow up in 2-3 months for A1c recheck

## 2015-11-19 NOTE — Progress Notes (Signed)
   Subjective:    Patient ID: Juan [name], male    DOB: 07/17/1951, 64 y.o.   MRN: [id number]  HPI Juan [name] is a 64 y.o. male with PMHx of T2DM, HTN, carotid artery stenosis who presents to the clinic for follow up for HTN and T2DM. Please see A&P for the status of the patient's chronic medical problems.   Past Medical History  Diagnosis Date  . Hyperlipemia   . Stroke [facility](HCC) 06/2013    Left side- weak, unable to make a fist  . Carotid artery occlusion   . Hypertension   . Diabetes mellitus without complication (HCC)     Type II, takes metformin per patient(11/01/15)    Outpatient Encounter Prescriptions as of 11/19/2015  Medication Sig  . aspirin 81 MG EC tablet Take 1 tablet (81 mg total) by mouth daily.  Marland Kitchen. atorvastatin (LIPITOR) 80 MG tablet Take 1 tablet (80 mg total) by mouth daily.  Marland Kitchen. glipiZIDE (GLUCOTROL) 5 MG tablet Take 1 tablet (5 mg total) by mouth daily before breakfast.  . lisinopril (PRINIVIL,ZESTRIL) 20 MG tablet Take 1 tablet (20 mg total) by mouth daily.  . metFORMIN (GLUCOPHAGE) 1000 MG tablet Take 1 tablet (1,000 mg total) by mouth 2 (two) times daily with a meal.   No facility-administered encounter medications on file as of 11/19/2015.    No family history on file.  Social History   Social History  . Marital Status: Married    Spouse Name: N/A  . Number of Children: N/A  . Years of Education: N/A   Occupational History  . Not on file.   Social History Main Topics  . Smoking status: Former Smoker -- 50 years    Quit date: 12/28/2005  . Smokeless tobacco: Former NeurosurgeonUser  . Alcohol Use: No     Comment: a few drinks occasionally   . Drug Use: No  . Sexual Activity: Not on file   Other Topics Concern  . Not on file   Social History Narrative   Lives at home with wife, son, and grandchildren.    Review of Systems General: Denies fatigue, change in appetite   Respiratory: Denies SOB, DOE.   Cardiovascular: Denies chest pain and palpitations.    Gastrointestinal: Denies nausea, vomiting, abdominal pain, diarrhea.  Endocrine: Denies polyuria, and polydipsia. Neurological: Denies dizziness, headaches, weakness, lightheadedness    Objective:   Physical Exam Filed Vitals:   11/19/15 1421 11/19/15 1440  BP: 164/83 152/81  Pulse: 72 66  Temp: 97.9 F (36.6 C)   TempSrc: Oral   Weight: 155 lb (70.308 kg)   SpO2: 100%    General: Vital signs reviewed.  Patient is well-developed and well-nourished, in no acute distress and cooperative with exam.  Cardiovascular: Irregular rhythm, regular rate, S1 normal, S2 normal. Pulmonary/Chest: Clear to auscultation bilaterally, no wheezes, rales, or rhonchi. Abdominal: Soft, non-tender, non-distended Extremities: No lower extremity edema bilaterally. Skin: Warm, dry and intact. No rashes or erythema. Psychiatric: Normal mood and affect. speech and behavior is normal. Cognition and memory are normal.      Assessment & Plan:   Please see problem based assessment and plan.

## 2015-11-20 NOTE — Progress Notes (Signed)
Internal Medicine Clinic Attending  Case discussed with Dr. Richardson at the time of the visit.  We reviewed the resident's history and exam and pertinent patient test results.  I agree with the assessment, diagnosis, and plan of care documented in the resident's note. 

## 2015-11-26 ENCOUNTER — Ambulatory Visit (INDEPENDENT_AMBULATORY_CARE_PROVIDER_SITE_OTHER): Payer: PPO | Admitting: Vascular Surgery

## 2015-11-26 ENCOUNTER — Encounter: Payer: Self-pay | Admitting: Vascular Surgery

## 2015-11-26 VITALS — BP 154/86 | HR 69 | Ht 65.0 in | Wt 155.1 lb

## 2015-11-26 DIAGNOSIS — I6523 Occlusion and stenosis of bilateral carotid arteries: Secondary | ICD-10-CM

## 2015-11-26 NOTE — Progress Notes (Signed)
[facility] [place]  Patient name: Juan [name] MRN: [id number] DOB: 17-Jan-1951 Sex: male  REASON FOR VISIT:  Discussed results of CT angiogram of carotid arteries  HPI: Juan [name] is a 64 y.o. male  Presenting today for discussion of recent CT angiogram. He is here today with an interpreter and also with his son who speaks [place]. The patient the has very good understanding of English as well. I initially cared for him in August 2014. At that time he presented with a left brain stroke. CT angiogram at that time revealed greater than 70% left internal carotid artery stenosis and a 85% right internal carotid artery stenosis. He underwent uneventful endarterectomy of his symptomatic left carotid artery. He failed to keep follow up and re-presented several months ago. He underwent duplex at that time which suggested a 60-70% stenosis. With his prior CT angiogram in 2014 showing 85% stenosis I recommended receding with endarterectomy. His insurance company a question this and suggested that we repeat the CT scan. This was done in this did show a 60-70% right carotid stenosis and a widely patent endarterectomy on the left. He does continue to have a deficit from his left brain stroke. Fortunately has never had any right brain symptoms.  Past Medical History  Diagnosis Date  . Hyperlipemia   . Stroke [facility]) 06/2013    Left side- weak, unable to make a fist  . Carotid artery occlusion   . Hypertension   . Diabetes mellitus without complication (HCC)     Type II, takes metformin per patient(11/01/15)    History reviewed. No pertinent family history.  SOCIAL HISTORY: Social History  Substance Use Topics  . Smoking status: Former Smoker -- 50 years    Quit date: 12/28/2005  . Smokeless tobacco: Former Neurosurgeon  . Alcohol Use: No     Comment: a few drinks occasionally     No Known Allergies  Current Outpatient Prescriptions  Medication Sig Dispense Refill  . aspirin 81 MG EC  tablet Take 1 tablet (81 mg total) by mouth daily. 30 tablet 6  . atorvastatin (LIPITOR) 80 MG tablet Take 1 tablet (80 mg total) by mouth daily. 30 tablet 6  . glipiZIDE (GLUCOTROL) 5 MG tablet Take 1 tablet (5 mg total) by mouth daily before breakfast. 30 tablet 1  . lisinopril (PRINIVIL,ZESTRIL) 20 MG tablet Take 1 tablet (20 mg total) by mouth daily. 30 tablet 3  . metFORMIN (GLUCOPHAGE) 1000 MG tablet Take 1 tablet (1,000 mg total) by mouth 2 (two) times daily with a meal. 60 tablet 3   No current facility-administered medications for this visit.    REVIEW OF SYSTEMS:   denotes positive finding,  denotes negative finding Cardiac  Comments:  Chest pain or chest pressure:    Shortness of breath upon exertion:    Short of breath when lying flat:    Irregular heart rhythm:        Vascular    Pain in calf, thigh, or hip brought on by ambulation:    Pain in feet at night that wakes you up from your sleep:     Blood clot in your veins:    Leg swelling:         Pulmonary    Oxygen at home:    Productive cough:     Wheezing:         Neurologic    Sudden weakness in arms or legs:  x   Sudden numbness in  arms or legs:     Sudden onset of difficulty speaking or slurred speech:    Temporary loss of vision in one eye:     Problems with dizziness:         Gastrointestinal    Blood in stool:     Vomited blood:         Genitourinary    Burning when urinating:     Blood in urine:        Psychiatric    Major depression:         Hematologic    Bleeding problems:    Problems with blood clotting too easily:        Skin    Rashes or ulcers:        Constitutional    Fever or chills:      PHYSICAL EXAM: Filed Vitals:   11/26/15 1037 11/26/15 1039  BP: 143/89 154/86  Pulse: 69   Height: 5\' 5"  (1.651 m)   Weight: 155 lb 1.6 oz (70.353 kg)   SpO2: 98%     GENERAL: The patient is a well-nourished male, in no acute distress. The vital signs are documented  above. CARDIAC: There is a regular rate and rhythm.  VASCULAR:  2+ radial pulses bilaterally. Left neck incision is healed with a somewhat prominent scar. No bruits bilaterally PULMONARY: There is good air exchange bilaterally without wheezing or rales. MUSCULOSKELETAL: There are no major deformities or cyanosis. NEUROLOGIC:  Weakness in his right arm and leg SKIN: There are no ulcers or rashes noted. PSYCHIATRIC: The patient has a normal affect.   MEDICAL ISSUES:  had long discussion with the patient and his son present and the the interpreter assisted in making sure that this was all started. Explain the differences interpretation of the prior CT and the most recent CT scan. With the most recent CT abdomen ultrasound suggesting 60-70% stenosis, I feel comfortable with observation only. We will see him again in 6 months with repeat ultrasound. Also explain symptoms of right brain event and he will present to the emergency room should this occur  No Follow-up on file.   Gretta BeganEarly, [place] [facility] [place]: [phone]

## 2015-11-26 NOTE — Addendum Note (Signed)
Addended by: Adria DillELDRIDGE-LEWIS, Yussef Jorge L on: 11/26/2015 05:03 PM   Modules accepted: Orders

## 2015-12-17 NOTE — Addendum Note (Signed)
Addended by: Neomia DearPOWERS, Sharolyn Weber E on: 12/17/2015 05:42 PM   Modules accepted: Orders

## 2016-02-10 ENCOUNTER — Encounter: Payer: Self-pay | Admitting: Internal Medicine

## 2016-02-10 ENCOUNTER — Encounter: Payer: PPO | Admitting: Internal Medicine

## 2016-02-10 ENCOUNTER — Telehealth: Payer: Self-pay | Admitting: Dietician

## 2016-02-10 NOTE — Telephone Encounter (Signed)
Called [facility] for refill history as part of project to assist patient with achieving a hemoglobin a1C <8%.  His last fill was:                       November for 30 days of glipizide   October 22nd for 30 days of lisinorpril   September for a 15 day supply of metformin   February 2016 for 30 days of Lipitor

## 2016-02-25 NOTE — Telephone Encounter (Signed)
Thanks for following up on this.  I believe he missed his last appointment with me.

## 2016-02-25 NOTE — Telephone Encounter (Signed)
Left message for son to call our office to schedule an appointment

## 2016-03-28 ENCOUNTER — Ambulatory Visit (INDEPENDENT_AMBULATORY_CARE_PROVIDER_SITE_OTHER): Payer: PPO | Admitting: Family Medicine

## 2016-03-28 VITALS — BP 145/83 | HR 89 | Temp 98.0°F | Resp 24 | Ht 64.0 in | Wt 155.0 lb

## 2016-03-28 DIAGNOSIS — S46811A Strain of other muscles, fascia and tendons at shoulder and upper arm level, right arm, initial encounter: Secondary | ICD-10-CM | POA: Diagnosis not present

## 2016-03-28 DIAGNOSIS — S161XXA Strain of muscle, fascia and tendon at neck level, initial encounter: Secondary | ICD-10-CM

## 2016-03-28 MED ORDER — TRAMADOL HCL 50 MG PO TABS: 50.0000 mg | ORAL_TABLET | Freq: Three times a day (TID) | ORAL | Status: DC | PRN

## 2016-03-28 MED ORDER — CYCLOBENZAPRINE HCL 5 MG PO TABS: 5.0000 mg | ORAL_TABLET | Freq: Three times a day (TID) | ORAL | Status: DC | PRN

## 2016-03-28 NOTE — Progress Notes (Signed)
[facility] [address], [place] [address] [phone]  Date:  03/28/2016   Name:  Juan [name]   DOB:  04/02/1951   MRN:  [id number]  PCP:  Juan PeekGill, Jacquelyn, MD   Chief Complaint  Patient presents with  . Neck Pain    Surgery 2003 & 2016     History of Present Illness:  Juan [name] is a 65 y.o. male patient with PMH of stroke and carotid artery stenosis who [facility] cc of neck pain that started yesterday.  Patient woke up with this neck pain of his right side.  It is at the anterior area of his neck and shoulder.  He can not move his neck barely at all.  He denies trauma, or quick movement.  He is not working at this time.  He has no radiating pain down the shoulder, or numbness or tingling of extremity.  Patient has a hx of cervical fusion.   Right carotid stenosis 60-70% according to documentation and monitored at this time.     Patient Active Problem List   Diagnosis Date Noted  . Health care maintenance 10/25/2014  . Essential hypertension 07/27/2013  . Dyslipidemia associated with type 2 diabetes mellitus (HCC) 07/27/2013  . Carotid artery stenosis with cerebral infarction s/p left endarerectomy  07/20/2013  . History of embolic stroke (6/29527/2014) 07/16/2013  . Diabetes mellitus type 2, uncontrolled, with complications (HCC) 07/16/2013    Past Medical History  Diagnosis Date  . Hyperlipemia   . Stroke [facility](HCC) 06/2013    Left side- weak, unable to make a fist  . Carotid artery occlusion   . Hypertension   . Diabetes mellitus without complication (HCC)     Type II, takes metformin per patient(11/01/15)    Past Surgical History  Procedure Laterality Date  . Neck surgery  2003    Cord Compression  . Tee without cardioversion N/A 07/20/2013    Procedure: TRANSESOPHAGEAL ECHOCARDIOGRAM (TEE);  Surgeon: Juan StadePeter C Nishan, MD;  [facility];  Service: Cardiovascular;  Laterality: N/A;  . Endarterectomy Left 08/04/2013    Procedure: Left  Carotid Endarterectomy with hemashield patch angioplasty;  Surgeon: Juan Earthlyodd F Early, MD;  [facility];  Service: Vascular;  Laterality: Left;  . Carotid endarterectomy Left 08-04-13    Social History  Substance Use Topics  . Smoking status: Former Smoker -- 50 years    Quit date: 12/28/2005  . Smokeless tobacco: Former NeurosurgeonUser  . Alcohol Use: No     Comment: a few drinks occasionally     History reviewed. No pertinent family history.  No Known Allergies  Medication list has been reviewed and updated.  Current Outpatient Prescriptions on File Prior to Visit  Medication Sig Dispense Refill  . aspirin 81 MG EC tablet Take 1 tablet (81 mg total) by mouth daily. 30 tablet 6  . atorvastatin (LIPITOR) 80 MG tablet Take 1 tablet (80 mg total) by mouth daily. 30 tablet 6  . glipiZIDE (GLUCOTROL) 5 MG tablet Take 1 tablet (5 mg total) by mouth daily before breakfast. 30 tablet 1  . lisinopril (PRINIVIL,ZESTRIL) 20 MG tablet Take 1 tablet (20 mg total) by mouth daily. 30 tablet 3  . metFORMIN (GLUCOPHAGE) 1000 MG tablet Take 1 tablet (1,000 mg total) by mouth 2 (two) times daily with a meal. 60 tablet 3   No current facility-administered medications on file prior to visit.    ROS ROS otherwise unremarkable unless listed above.  Physical Examination: BP 145/83 mmHg  Pulse 89  Temp(Src) 98 F (36.7 C) (Oral)  Resp 24  Ht  (1.626 m)  Wt 155 lb (70.308 kg)  BMI 26.59 kg/m2  SpO2 97% Ideal Body Weight: Weight in (lb) to have BMI = 25: 145.3  Physical Exam  Constitutional: He is oriented to person, place, and time. He appears well-developed and well-nourished. No distress.  HENT:  Head: Normocephalic and atraumatic.  Eyes: Conjunctivae and EOM are normal. Pupils are equal, round, and reactive to light.  Cardiovascular: Normal rate.   Pulses:      Carotid pulses are 2+ on the right side, and 2+ on the left side. Right carotid without bruit.  Tender area just posterior to carotid.     Pulmonary/Chest: Effort normal. No respiratory distress.  Musculoskeletal:       Cervical back: He exhibits decreased range of motion (15 degrees of both directions of horitontal rotation.  he has decreased rom in all planes.  ) and tenderness (tenderness at the anterior trapezius extending posteriorly to adjacent musculature of the cervical spine.  ).  Neurological: He is alert and oriented to person, place, and time.  Skin: Skin is warm and dry. He is not diaphoretic.  Psychiatric: He has a normal mood and affect. His behavior is normal.     Assessment and Plan: Juan [name] is a 65 y.o. male who is here today for cc of neck pain yesterday.   This appears to be musculature at this time. I am giving him a muscle relaxant at this time. Advised heat prior to stretches that were given to him. Icing directly after.  We will hold off on NSAID use at this time. He will return to clinic for worsening symptoms as discussed. Trapezius strain, right, initial encounter - Plan: traMADol (ULTRAM) 50 MG tablet  Neck strain, initial encounter - Plan: cyclobenzaprine (FLEXERIL) 5 MG tablet, traMADol (ULTRAM) 50 MG tablet  Trena Platt, PA-C [facility] 03/28/2016 2:56 PM

## 2016-03-28 NOTE — Patient Instructions (Signed)
Please do not operate heavy machinery when taking the muscle relaxant. I would like you to pick three pictures per day.  You will do 6 repetitions.  If it says hold the position, hold for 10 seconds.  Directly after stretches, please ice for 15-20 seconds.  You can warm before stretching.  Cervical Strain and Sprain With Rehab Cervical strain and sprain are injuries that commonly occur with "whiplash" injuries. Whiplash occurs when the neck is forcefully whipped backward or forward, such as during a motor vehicle accident or during contact sports. The muscles, ligaments, tendons, discs, and nerves of the neck are susceptible to injury when this occurs. RISK FACTORS Risk of having a whiplash injury increases if:  Osteoarthritis of the spine.  Situations that make head or neck accidents or trauma more likely.  High-risk sports (football, rugby, wrestling, hockey, auto racing, gymnastics, diving, contact karate, or boxing).  Poor strength and flexibility of the neck.  Previous neck injury.  Poor tackling technique.  Improperly fitted or padded equipment. SYMPTOMS   Pain or stiffness in the front or back of neck or both.  Symptoms may present immediately or up to 24 hours after injury.  Dizziness, headache, nausea, and vomiting.  Muscle spasm with soreness and stiffness in the neck.  Tenderness and swelling at the injury site. PREVENTION  Learn and use proper technique (avoid tackling with the head, spearing, and head-butting; use proper falling techniques to avoid landing on the head).  Warm up and stretch properly before activity.  Maintain physical fitness:  Strength, flexibility, and endurance.  Cardiovascular fitness.  Wear properly fitted and padded protective equipment, such as padded soft collars, for participation in contact sports. PROGNOSIS  Recovery from cervical strain and sprain injuries is dependent on the extent of the injury. These injuries are usually  curable in 1 week to 3 months with appropriate treatment.  RELATED COMPLICATIONS   Temporary numbness and weakness may occur if the nerve roots are damaged, and this may persist until the nerve has completely healed.  Chronic pain due to frequent recurrence of symptoms.  Prolonged healing, especially if activity is resumed too soon (before complete recovery). TREATMENT  Treatment initially involves the use of ice and medication to help reduce pain and inflammation. It is also important to perform strengthening and stretching exercises and modify activities that worsen symptoms so the injury does not get worse. These exercises may be performed at home or with a therapist. For patients who experience severe symptoms, a soft, padded collar may be recommended to be worn around the neck.  Improving your posture may help reduce symptoms. Posture improvement includes pulling your chin and abdomen in while sitting or standing. If you are sitting, sit in a firm chair with your buttocks against the back of the chair. While sleeping, try replacing your pillow with a small towel rolled to 2 inches in diameter, or use a cervical pillow or soft cervical collar. Poor sleeping positions delay healing.  For patients with nerve root damage, which causes numbness or weakness, the use of a cervical traction apparatus may be recommended. Surgery is rarely necessary for these injuries. However, cervical strain and sprains that are present at birth (congenital) may require surgery. MEDICATION   If pain medication is necessary, nonsteroidal anti-inflammatory medications, such as aspirin and ibuprofen, or other minor pain relievers, such as acetaminophen, are often recommended.  Do not take pain medication for 7 days before surgery.  Prescription pain relievers may be given if deemed necessary  by your caregiver. Use only as directed and only as much as you need. HEAT AND COLD:   Cold treatment (icing) relieves pain and  reduces inflammation. Cold treatment should be applied for 10 to 15 minutes every 2 to 3 hours for inflammation and pain and immediately after any activity that aggravates your symptoms. Use ice packs or an ice massage.  Heat treatment may be used prior to performing the stretching and strengthening activities prescribed by your caregiver, physical therapist, or athletic trainer. Use a heat pack or a warm soak. SEEK MEDICAL CARE IF:   Symptoms get worse or do not improve in 2 weeks despite treatment.  New, unexplained symptoms develop (drugs used in treatment may produce side effects). EXERCISES RANGE OF MOTION (ROM) AND STRETCHING EXERCISES - Cervical Strain and Sprain These exercises may help you when beginning to rehabilitate your injury. In order to successfully resolve your symptoms, you must improve your posture. These exercises are designed to help reduce the forward-head and rounded-shoulder posture which contributes to this condition. Your symptoms may resolve with or without further involvement from your physician, physical therapist or athletic trainer. While completing these exercises, remember:   Restoring tissue flexibility helps normal motion to return to the joints. This allows healthier, less painful movement and activity.  An effective stretch should be held for at least 20 seconds, although you may need to begin with shorter hold times for comfort.  A stretch should never be painful. You should only feel a gentle lengthening or release in the stretched tissue. STRETCH- Axial Extensors  Lie on your back on the floor. You may bend your knees for comfort. Place a rolled-up hand towel or dish towel, about 2 inches in diameter, under the part of your head that makes contact with the floor.  Gently tuck your chin, as if trying to make a "double chin," until you feel a gentle stretch at the base of your head.  Hold __________ seconds. Repeat __________ times. Complete this  exercise __________ times per day.  STRETCH - Axial Extension   Stand or sit on a firm surface. Assume a good posture: chest up, shoulders drawn back, abdominal muscles slightly tense, knees unlocked (if standing) and feet hip width apart.  Slowly retract your chin so your head slides back and your chin slightly lowers. Continue to look straight ahead.  You should feel a gentle stretch in the back of your head. Be certain not to feel an aggressive stretch since this can cause headaches later.  Hold for __________ seconds. Repeat __________ times. Complete this exercise __________ times per day. STRETCH - Cervical Side Bend   Stand or sit on a firm surface. Assume a good posture: chest up, shoulders drawn back, abdominal muscles slightly tense, knees unlocked (if standing) and feet hip width apart.  Without letting your nose or shoulders move, slowly tip your right / left ear to your shoulder until your feel a gentle stretch in the muscles on the opposite side of your neck.  Hold __________ seconds. Repeat __________ times. Complete this exercise __________ times per day. STRETCH - Cervical Rotators   Stand or sit on a firm surface. Assume a good posture: chest up, shoulders drawn back, abdominal muscles slightly tense, knees unlocked (if standing) and feet hip width apart.  Keeping your eyes level with the ground, slowly turn your head until you feel a gentle stretch along the back and opposite side of your neck.  Hold __________ seconds. Repeat __________ times. Complete  this exercise __________ times per day. RANGE OF MOTION - Neck Circles   Stand or sit on a firm surface. Assume a good posture: chest up, shoulders drawn back, abdominal muscles slightly tense, knees unlocked (if standing) and feet hip width apart.  Gently roll your head down and around from the back of one shoulder to the back of the other. The motion should never be forced or painful.  Repeat the motion 10-20  times, or until you feel the neck muscles relax and loosen. Repeat __________ times. Complete the exercise __________ times per day. STRENGTHENING EXERCISES - Cervical Strain and Sprain These exercises may help you when beginning to rehabilitate your injury. They may resolve your symptoms with or without further involvement from your physician, physical therapist, or athletic trainer. While completing these exercises, remember:   Muscles can gain both the endurance and the strength needed for everyday activities through controlled exercises.  Complete these exercises as instructed by your physician, physical therapist, or athletic trainer. Progress the resistance and repetitions only as guided.  You may experience muscle soreness or fatigue, but the pain or discomfort you are trying to eliminate should never worsen during these exercises. If this pain does worsen, stop and make certain you are following the directions exactly. If the pain is still present after adjustments, discontinue the exercise until you can discuss the trouble with your clinician. STRENGTH - Cervical Flexors, Isometric  Face a wall, standing about 6 inches away. Place a small pillow, a ball about 6-8 inches in diameter, or a folded towel between your forehead and the wall.  Slightly tuck your chin and gently push your forehead into the soft object. Push only with mild to moderate intensity, building up tension gradually. Keep your jaw and forehead relaxed.  Hold 10 to 20 seconds. Keep your breathing relaxed.  Release the tension slowly. Relax your neck muscles completely before you start the next repetition. Repeat __________ times. Complete this exercise __________ times per day. STRENGTH- Cervical Lateral Flexors, Isometric   Stand about 6 inches away from a wall. Place a small pillow, a ball about 6-8 inches in diameter, or a folded towel between the side of your head and the wall.  Slightly tuck your chin and gently  tilt your head into the soft object. Push only with mild to moderate intensity, building up tension gradually. Keep your jaw and forehead relaxed.  Hold 10 to 20 seconds. Keep your breathing relaxed.  Release the tension slowly. Relax your neck muscles completely before you start the next repetition. Repeat __________ times. Complete this exercise __________ times per day. STRENGTH - Cervical Extensors, Isometric   Stand about 6 inches away from a wall. Place a small pillow, a ball about 6-8 inches in diameter, or a folded towel between the back of your head and the wall.  Slightly tuck your chin and gently tilt your head back into the soft object. Push only with mild to moderate intensity, building up tension gradually. Keep your jaw and forehead relaxed.  Hold 10 to 20 seconds. Keep your breathing relaxed.  Release the tension slowly. Relax your neck muscles completely before you start the next repetition. Repeat __________ times. Complete this exercise __________ times per day. POSTURE AND BODY MECHANICS CONSIDERATIONS - Cervical Strain and Sprain Keeping correct posture when sitting, standing or completing your activities will reduce the stress put on different body tissues, allowing injured tissues a chance to heal and limiting painful experiences. The following are general guidelines for  improved posture. Your physician or physical therapist will provide you with any instructions specific to your needs. While reading these guidelines, remember:  The exercises prescribed by your provider will help you have the flexibility and strength to maintain correct postures.  The correct posture provides the optimal environment for your joints to work. All of your joints have less wear and tear when properly supported by a spine with good posture. This means you will experience a healthier, less painful body.  Correct posture must be practiced with all of your activities, especially prolonged  sitting and standing. Correct posture is as important when doing repetitive low-stress activities (typing) as it is when doing a single heavy-load activity (lifting). PROLONGED STANDING WHILE SLIGHTLY LEANING FORWARD When completing a task that requires you to lean forward while standing in one place for a long time, place either foot up on a stationary 2- to 4-inch high object to help maintain the best posture. When both feet are on the ground, the low back tends to lose its slight inward curve. If this curve flattens (or becomes too large), then the back and your other joints will experience too much stress, fatigue more quickly, and can cause pain.  RESTING POSITIONS Consider which positions are most painful for you when choosing a resting position. If you have pain with flexion-based activities (sitting, bending, stooping, squatting), choose a position that allows you to rest in a less flexed posture. You would want to avoid curling into a fetal position on your side. If your pain worsens with extension-based activities (prolonged standing, working overhead), avoid resting in an extended position such as sleeping on your stomach. Most people will find more comfort when they rest with their spine in a more neutral position, neither too rounded nor too arched. Lying on a non-sagging bed on your side with a pillow between your knees, or on your back with a pillow under your knees will often provide some relief. Keep in mind, being in any one position for a prolonged period of time, no matter how correct your posture, can still lead to stiffness. WALKING Walk with an upright posture. Your ears, shoulders, and hips should all line up. OFFICE WORK When working at a desk, create an environment that supports good, upright posture. Without extra support, muscles fatigue and lead to excessive strain on joints and other tissues. CHAIR:  A chair should be able to slide under your desk when your back makes contact  with the back of the chair. This allows you to work closely.  The chair's height should allow your eyes to be level with the upper part of your monitor and your hands to be slightly lower than your elbows.  Body position:  Your feet should make contact with the floor. If this is not possible, use a foot rest.  Keep your ears over your shoulders. This will reduce stress on your neck and low back.   This information is not intended to replace advice given to you by your health care provider. Make sure you discuss any questions you have with your health care provider.   Document Released: 12/14/2005 Document Revised: 01/04/2015 Document Reviewed: 03/28/2009 Elsevier Interactive Patient Education [employer]2016 [employer]

## 2016-03-28 NOTE — Progress Notes (Signed)
Patient ID: Juan SarnaYNguan [name], male    DOB: 03/26/1951  Age: 65 y.o. MRN: [id number]  Chief Complaint  Patient presents with  . Neck Pain    Surgery 2003 & 2016    Subjective:   Patient seen at the request of Trena PlattStephanie English, PA-C, who saw the patient. He is been having pain in the right side of the neck. He just woke up that way with it a couple of days ago. It has persisted has a fairly severe pain. He has a history of carotid disease on that side. He's had a little stroke in the past. No known injury.  Current allergies, medications, problem list, past/family and social histories reviewed.  Objective:  BP 145/83 mmHg  Pulse 89  Temp(Src) 98 F (36.7 C) (Oral)  Resp 24  Ht 5\' 4"  (1.626 m)  Wt 155 lb (70.308 kg)  BMI 26.59 kg/m2  SpO2 97%  Neck is supple with fair range of motion. Very tender in the right side of the neck at the base of the skull behind the ear and along deep to the sternocleidomastoid muscle. It hurts to tilt or rotated.Marland Kitchen. However he can move it.  Assessment & Plan:   Assessment: 1. Trapezius strain, right, initial encounter   2. Neck strain, initial encounter       Plan: This appears to be a mechanical pain, and treatment plan was discussed and agreed upon.   Amyriah Buras, MD 03/28/2016

## 2016-04-01 ENCOUNTER — Telehealth: Payer: Self-pay

## 2016-04-01 NOTE — Telephone Encounter (Signed)
PA completed for cyclobenzaprine on covermymeds. Pending. 

## 2016-04-02 NOTE — Telephone Encounter (Signed)
PA approved through 12/27/16. Notified pharm.  

## 2016-05-11 ENCOUNTER — Telehealth: Payer: Self-pay | Admitting: Internal Medicine

## 2016-05-11 NOTE — Telephone Encounter (Signed)
APT. REMINDER CALL, NO ANSWER °

## 2016-05-12 ENCOUNTER — Encounter: Payer: Self-pay | Admitting: Pharmacist

## 2016-05-12 ENCOUNTER — Ambulatory Visit (INDEPENDENT_AMBULATORY_CARE_PROVIDER_SITE_OTHER): Payer: PPO | Admitting: Internal Medicine

## 2016-05-12 ENCOUNTER — Encounter: Payer: Self-pay | Admitting: Internal Medicine

## 2016-05-12 VITALS — BP 140/90 | HR 72 | Temp 97.9°F | Resp 18 | Ht 65.0 in | Wt 155.4 lb

## 2016-05-12 DIAGNOSIS — Z0000 Encounter for general adult medical examination without abnormal findings: Secondary | ICD-10-CM

## 2016-05-12 DIAGNOSIS — Z7984 Long term (current) use of oral hypoglycemic drugs: Secondary | ICD-10-CM

## 2016-05-12 DIAGNOSIS — E1165 Type 2 diabetes mellitus with hyperglycemia: Secondary | ICD-10-CM | POA: Diagnosis not present

## 2016-05-12 DIAGNOSIS — E784 Other hyperlipidemia: Secondary | ICD-10-CM | POA: Diagnosis not present

## 2016-05-12 DIAGNOSIS — I10 Essential (primary) hypertension: Secondary | ICD-10-CM | POA: Diagnosis not present

## 2016-05-12 DIAGNOSIS — E785 Hyperlipidemia, unspecified: Secondary | ICD-10-CM

## 2016-05-12 DIAGNOSIS — Z9889 Other specified postprocedural states: Secondary | ICD-10-CM

## 2016-05-12 DIAGNOSIS — E118 Type 2 diabetes mellitus with unspecified complications: Secondary | ICD-10-CM | POA: Diagnosis not present

## 2016-05-12 DIAGNOSIS — E1169 Type 2 diabetes mellitus with other specified complication: Secondary | ICD-10-CM | POA: Diagnosis not present

## 2016-05-12 DIAGNOSIS — E1151 Type 2 diabetes mellitus with diabetic peripheral angiopathy without gangrene: Secondary | ICD-10-CM

## 2016-05-12 DIAGNOSIS — I63239 Cerebral infarction due to unspecified occlusion or stenosis of unspecified carotid arteries: Secondary | ICD-10-CM

## 2016-05-12 DIAGNOSIS — Z8673 Personal history of transient ischemic attack (TIA), and cerebral infarction without residual deficits: Secondary | ICD-10-CM

## 2016-05-12 LAB — GLUCOSE, CAPILLARY: Glucose-Capillary: 181 mg/dL — ABNORMAL HIGH (ref 65–99)

## 2016-05-12 LAB — POCT GLYCOSYLATED HEMOGLOBIN (HGB A1C): Hemoglobin A1C: 8.9

## 2016-05-12 MED ORDER — ATORVASTATIN CALCIUM 80 MG PO TABS: 80.0000 mg | ORAL_TABLET | Freq: Every day | ORAL | Status: DC

## 2016-05-12 MED ORDER — LISINOPRIL 20 MG PO TABS: 20.0000 mg | ORAL_TABLET | Freq: Every day | ORAL | Status: DC

## 2016-05-12 MED ORDER — METFORMIN HCL 1000 MG PO TABS: 1000.0000 mg | ORAL_TABLET | Freq: Two times a day (BID) | ORAL | Status: DC

## 2016-05-12 MED ORDER — ASPIRIN 81 MG PO TBEC: 81.0000 mg | DELAYED_RELEASE_TABLET | Freq: Every day | ORAL | Status: DC

## 2016-05-12 MED ORDER — GLIPIZIDE 5 MG PO TABS: 5.0000 mg | ORAL_TABLET | Freq: Every day | ORAL | Status: DC

## 2016-05-12 NOTE — Addendum Note (Signed)
Addended by: Marrian SalvageGILL, Velma Agnes S on: 05/12/2016 05:34 PM   Modules accepted: Orders

## 2016-05-12 NOTE — Assessment & Plan Note (Addendum)
BP today 172/93.  Has not been compliant with lisinopril 20mg .  He has not had any refills this year.  Upon recheck BP 140/90.   -refilled lisinopril and encouraged taking this  -check lipid panel -advised low sodium diet  -advised to resume ASA, atorvastatin

## 2016-05-12 NOTE — Assessment & Plan Note (Addendum)
HA1c 8.9 today.  He was supposed to be taking metformin 1000mg  bid however Dr. Selena BattenKim reports he has not had any refills of meds from the pharmacy this year except for tramadol.  Last eye exam 2016 without retinopathy.   -will check lipid panel, BMP, urine microalb -refilled metformin and glipizide, stressed importance of taking this -refilled ASA, atorvastatin

## 2016-05-12 NOTE — Assessment & Plan Note (Addendum)
Pt seen by Dr. Arbie CookeyEarly in 10/2015 for possible R endarterectomy.  However, decided to hold off and f/u in May 2017.  He has an appt 05/26/16.   -encouraged him to keep appt with Dr. Arbie CookeyEarly  -check lipid panel  -cont atorvastatin and ASA

## 2016-05-12 NOTE — Assessment & Plan Note (Addendum)
He was referred for colonoscopy but a no show with Dr. Madilyn FiremanHayes x 2 and has one additional time until he will not be able to see Dr. Madilyn FiremanHayes (see referral notes).  -encouraged him to f/u with Dr. Madilyn FiremanHayes

## 2016-05-12 NOTE — Patient Instructions (Addendum)
Thank you for your visit today.   Please return to the internal medicine clinic in about 3 month(s) or sooner if needed.     I have made the following additions/changes to your medications:  Please make sure to take the metformin twice daily.   Also please take glipizide daily before breakfast. Continue to take all other medications including lisinopril 20mg  daily.    Please keep your appointment with the vascular surgeon, Dr. Arbie CookeyEarly later this month.  Please follow up with gastroenterology, Dr. Madilyn FiremanHayes to receive your colonoscopy.    Please be sure to bring all of your medications with you to every visit; this includes herbal supplements, vitamins, eye drops, and any over-the-counter medications.   Should you have any questions regarding your medications and/or any new or worsening symptoms, please be sure to call the clinic at [phone].   If you believe that you are suffering from a life threatening condition or one that may result in the loss of limb or function, then you should call 911 and proceed to the nearest Emergency Department.   A healthy lifestyle and preventative care can promote health and wellness.   Maintain regular health, dental, and eye exams.  Eat a healthy diet. Foods like vegetables, fruits, whole grains, low-fat dairy products, and lean protein foods contain the nutrients you need without too many calories. Decrease your intake of foods high in solid fats, added sugars, and salt. Get information about a proper diet from your caregiver, if necessary.  Regular physical exercise is one of the most important things you can do for your health. Most adults should get at least 150 minutes of moderate-intensity exercise (any activity that increases your heart rate and causes you to sweat) each week. In addition, most adults need muscle-strengthening exercises on 2 or more days a week.   Maintain a healthy weight. The body mass index (BMI) is a screening tool to identify  possible weight problems. It provides an estimate of body fat based on height and weight. Your caregiver can help determine your BMI, and can help you achieve or maintain a healthy weight. For adults 20 years and older:  A BMI below 18.5 is considered underweight.  A BMI of 18.5 to 24.9 is normal.  A BMI of 25 to 29.9 is considered overweight.  A BMI of 30 and above is considered obese.

## 2016-05-12 NOTE — Assessment & Plan Note (Signed)
Has not been compliant with atorvastatin. -refilled today and will recheck lipids

## 2016-05-12 NOTE — Progress Notes (Signed)
Patient ID: Juan [name], male   DOB: 04/30/1951, 65 y.o.   MRN: [id number]     Subjective:   Patient ID: Juan [name] male    DOB: 07/25/1951 65 y.o.    MRN: [id number] Health Maintenance Due: Health Maintenance Due  Topic Date Due  . TETANUS/TDAP  03/29/1969  . COLONOSCOPY  03/29/2000  . LIPID PANEL  10/26/2015  . COLON CANCER SCREENING ANNUAL FOBT  11/03/2015  . HEMOGLOBIN A1C  01/18/2016  . FOOT EXAM  01/29/2016    _________________________________________________  HPI: Juan SenegalMr.Bunny Guerrieri is a 65 y.o. male here for f/u of HTN and DM.  Pt has a PMH outlined below.  Please see problem-based charting assessment and plan for further status of patient's chronic medical problems addressed at today's visit.  PMH: Past Medical History  Diagnosis Date  . Hyperlipemia   . 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 Carotid artery occlusion   . Hypertension   . Diabetes mellitus without complication (HCC)     Type II, takes metformin per patient(11/01/15)    Medications: Current Outpatient Prescriptions on File Prior to Visit  Medication Sig Dispense Refill  . cyclobenzaprine (FLEXERIL) 5 MG tablet Take 1-2 tablets (5-10 mg total) by mouth 3 (three) times daily as needed for muscle spasms. 30 tablet 1  . traMADol (ULTRAM) 50 MG tablet Take 1 tablet (50 mg total) by mouth every 8 (eight) hours as needed. 30 tablet 0   No current facility-administered medications on file prior to visit.    Allergies: No Known Allergies  FH: No family history on file.  SH: Social History   Social History  . Marital Status: Married    Spouse Name: N/A  . Number of Children: N/A  . Years of Education: N/A   Social History Main Topics  . Smoking status: Former Smoker -- 50 years    Quit date: 12/28/2005  . Smokeless tobacco: Former NeurosurgeonUser  . Alcohol Use: No     Comment: a few drinks occasionally   . Drug Use: No  . Sexual Activity: Not Asked   Other Topics Concern  . None    Social History Narrative   Lives at home with wife, son, and grandchildren.    Review of Systems: Constitutional: Negative for fever, chills.  Eyes: Negative for blurred vision.  Respiratory: Negative for cough and shortness of breath.  Cardiovascular: Negative for chest pain.  Gastrointestinal: Negative for nausea, vomiting. Neurological: Negative for dizziness, headaches.    Objective:   Vital Signs: Filed Vitals:   05/12/16 1051  BP: 172/93  Pulse: 72  Temp: 97.9 F (36.6 C)  TempSrc: Oral  Resp: 18  Height: 5\' 5"  (1.651 m)  Weight: 155 lb 6.4 oz (70.489 kg)  SpO2: 99%      BP Readings from Last 3 Encounters:  05/12/16 172/93  03/28/16 145/83  11/26/15 154/86    Physical Exam: Constitutional: Vital signs reviewed.  Patient is in NAD and cooperative with exam.  Head: Normocephalic and atraumatic. Eyes: EOMI, conjunctivae nl, no scleral icterus.  Neck: Supple. Cardiovascular: RRR, no MRG. Pulmonary/Chest: Normal effort, CTAB, no wheezes, rales, or rhonchi. Abdominal: Soft. NT/ND +BS. Neurological: A&O x3, cranial nerves II-XII are grossly intact, moving all extremities. Extremities: No LE edema.  Skin: Warm, dry and intact. No rash.   Assessment & Plan:   Assessment and plan was discussed and formulated with my attending.

## 2016-05-12 NOTE — Progress Notes (Signed)
Case discussed with Dr. Gill soon after the resident saw the patient.  We reviewed the resident's history and exam and pertinent patient test results.  I agree with the assessment, diagnosis, and plan of care documented in the resident's note. 

## 2016-05-13 ENCOUNTER — Other Ambulatory Visit: Payer: Self-pay | Admitting: Internal Medicine

## 2016-05-13 DIAGNOSIS — E785 Hyperlipidemia, unspecified: Secondary | ICD-10-CM

## 2016-05-13 LAB — BMP8+ANION GAP
Anion Gap: 19 mmol/L — ABNORMAL HIGH (ref 10.0–18.0)
BUN/Creatinine Ratio: 10 (ref 10–24)
BUN: 14 mg/dL (ref 8–27)
CO2: 23 mmol/L (ref 18–29)
Calcium: 9.3 mg/dL (ref 8.6–10.2)
Chloride: 94 mmol/L — ABNORMAL LOW (ref 96–106)
Creatinine, Ser: 1.36 mg/dL — ABNORMAL HIGH (ref 0.76–1.27)
GFR calc Af Amer: 62 mL/min/1.73
GFR calc non Af Amer: 54 mL/min/1.73 — ABNORMAL LOW
Glucose: 175 mg/dL — ABNORMAL HIGH (ref 65–99)
Potassium: 4.6 mmol/L (ref 3.5–5.2)
Sodium: 136 mmol/L (ref 134–144)

## 2016-05-13 LAB — MICROALBUMIN / CREATININE URINE RATIO
Creatinine, Urine: 129.6 mg/dL
MICROALB/CREAT RATIO: 1254.9 mg/g{creat} — ABNORMAL HIGH (ref 0.0–30.0)
Microalbumin, Urine: 1626.4 ug/mL

## 2016-05-13 LAB — LIPID PANEL
CHOLESTEROL TOTAL: 270 mg/dL — AB (ref 100–199)
Chol/HDL Ratio: 7.7 ratio units — ABNORMAL HIGH (ref 0.0–5.0)
HDL: 35 mg/dL — ABNORMAL LOW (ref >39)
TRIGLYCERIDES: 524 mg/dL — AB (ref 0–149)

## 2016-05-13 NOTE — Addendum Note (Signed)
Addended by: Bufford SpikesFULCHER, Tanessa Tidd N on: 05/13/2016 09:57 AM   Modules accepted: Orders

## 2016-05-14 LAB — LDL CHOLESTEROL, DIRECT: LDL Direct: 157 mg/dL — ABNORMAL HIGH (ref 0–99)

## 2016-05-14 LAB — SPECIMEN STATUS REPORT

## 2016-05-20 ENCOUNTER — Encounter: Payer: Self-pay | Admitting: Vascular Surgery

## 2016-05-20 NOTE — Progress Notes (Signed)
Patient ID: Juan SarnaYNguan [name], male   DOB: 11/26/1951, 65 y.o.   MRN: [id number] Patient case reviewed by [facility] [facility] pharmacy does not show any medications filled in 2017 except for tramadol. PCP notified.

## 2016-05-22 ENCOUNTER — Encounter: Payer: Self-pay | Admitting: Vascular Surgery

## 2016-05-26 ENCOUNTER — Ambulatory Visit (HOSPITAL_COMMUNITY)
Admission: RE | Admit: 2016-05-26 | Discharge: 2016-05-26 | Disposition: A | Discharge status: Home / Self Care | Payer: PPO | Source: Ambulatory Visit | Attending: Vascular Surgery | Admitting: Vascular Surgery

## 2016-05-26 ENCOUNTER — Ambulatory Visit (INDEPENDENT_AMBULATORY_CARE_PROVIDER_SITE_OTHER): Payer: PPO | Admitting: Vascular Surgery

## 2016-05-26 ENCOUNTER — Encounter: Payer: Self-pay | Admitting: Vascular Surgery

## 2016-05-26 VITALS — BP 118/88 | HR 65 | Temp 97.6°F | Resp 18 | Ht 63.5 in | Wt 153.6 lb

## 2016-05-26 DIAGNOSIS — E119 Type 2 diabetes mellitus without complications: Secondary | ICD-10-CM | POA: Insufficient documentation

## 2016-05-26 DIAGNOSIS — I6523 Occlusion and stenosis of bilateral carotid arteries: Secondary | ICD-10-CM

## 2016-05-26 DIAGNOSIS — E785 Hyperlipidemia, unspecified: Secondary | ICD-10-CM | POA: Diagnosis not present

## 2016-05-26 DIAGNOSIS — I10 Essential (primary) hypertension: Secondary | ICD-10-CM | POA: Diagnosis not present

## 2016-05-26 DIAGNOSIS — I6521 Occlusion and stenosis of right carotid artery: Secondary | ICD-10-CM | POA: Insufficient documentation

## 2016-05-26 NOTE — Progress Notes (Signed)
Vascular and Vein Specialist of [place]  Patient name: Juan [name] MRN: [id number] DOB: 1951-08-17 Sex: male  REASON FOR VISIT: Follow-up of carotid disease  HPI: Juan [name] is a 65 y.o. male status post left carotid endarterectomy August 2014. He had had a preoperative stroke resulting in significant right arm disability. He reports no new neurologic deficits. He has had no cardiac disease. Here today for duplex follow-up. There was some concern regarding the degree of narrowing in his contralateral asymptomatic side. There are discrepancies between ultrasound and CT findings. Repeat CT scan showed moderate only stenosis. He is here today with an interpreter although he speaks ver  Past Medical History  Diagnosis Date  . Hyperlipemia   . Stroke [facility]) 06/2013    Left side- weak, unable to make a fist  . Carotid artery occlusion   . Hypertension   . Diabetes mellitus without complication (HCC)     Type II, takes metformin per patient(11/01/15)    History reviewed. No pertinent family history.  SOCIAL HISTORY: Social History  Substance Use Topics  . Smoking status: Former Smoker -- 50 years    Quit date: 12/28/2005  . Smokeless tobacco: Former Neurosurgeon  . Alcohol Use: No     Comment: a few drinks occasionally     No Known Allergies  Current Outpatient Prescriptions  Medication Sig Dispense Refill  . aspirin 81 MG EC tablet Take 1 tablet (81 mg total) by mouth daily. 90 tablet 3  . atorvastatin (LIPITOR) 80 MG tablet Take 1 tablet (80 mg total) by mouth daily. 90 tablet 1  . cyclobenzaprine (FLEXERIL) 5 MG tablet Take 1-2 tablets (5-10 mg total) by mouth 3 (three) times daily as needed for muscle spasms. 30 tablet 1  . glipiZIDE (GLUCOTROL) 5 MG tablet Take 1 tablet (5 mg total) by mouth daily before breakfast. 90 tablet 0  . lisinopril (PRINIVIL,ZESTRIL) 20 MG tablet Take 1 tablet (20 mg total) by mouth daily. 90 tablet 1  . metFORMIN (GLUCOPHAGE) 1000 MG tablet Take 1 tablet  (1,000 mg total) by mouth 2 (two) times daily with a meal. 180 tablet 1  . traMADol (ULTRAM) 50 MG tablet Take 1 tablet (50 mg total) by mouth every 8 (eight) hours as needed. 30 tablet 0   No current facility-administered medications for this visit.    REVIEW OF SYSTEMS:   denotes positive finding,  denotes negative finding Cardiac  Comments:  Chest pain or chest pressure:    Shortness of breath upon exertion:    Short of breath when lying flat:    Irregular heart rhythm:        Vascular    Pain in calf, thigh, or hip brought on by ambulation:    Pain in feet at night that wakes you up from your sleep:     Blood clot in your veins:    Leg swelling:         Pulmonary    Oxygen at home:    Productive cough:     Wheezing:         Neurologic    Sudden weakness in arms or legs:     Sudden numbness in arms or legs:     Sudden onset of difficulty speaking or slurred speech:    Temporary loss of vision in one eye:     Problems with dizziness:         Gastrointestinal    Blood in stool:     Vomited blood:  Genitourinary    Burning when urinating:     Blood in urine:        Psychiatric    Major depression:         Hematologic    Bleeding problems:    Problems with blood clotting too easily:        Skin    Rashes or ulcers:        Constitutional    Fever or chills:      PHYSICAL EXAM: Filed Vitals:   05/26/16 1218  BP: 118/88  Pulse: 65  Temp: 97.6 F (36.4 C)  TempSrc: Oral  Resp: 18  Height: 5' 3.5" (1.613 m)  Weight: 153 lb 9.6 oz (69.673 kg)  SpO2: 97%    GENERAL: The patient is a well-nourished male, in no acute distress. The vital signs are documented above. CARDIAC: There is a regular rate and rhythm.  VASCULAR: 2+ radial pulses bilaterally, left carotid incision well-healed with moderate keloid. Right carotid without bruits PULMONARY: There is good air exchange bilaterally without wheezing or rales. ABDOMEN: Soft and non-tender    MUSCULOSKELETAL: There are no major deformities or cyanosis. NEUROLOGIC: Weakness and clumsiness and right hand SKIN: There are no ulcers or rashes noted. PSYCHIATRIC: The patient has a normal affect.  DATA:  Carotid duplex today reveals widely patent endarterectomy site on the left. His right carotid is noted to be an approximate 40-59% stenosis range.  MEDICAL ISSUES: Stable follow-up. We will continue his usual activities. We will see him on a yearly basis with carotid duplex follow-up. He will present to the emergency room for any new neurologic deficit    Lillyana Majette [facility] [place] [facility] [phone]

## 2016-06-08 ENCOUNTER — Encounter: Payer: Self-pay | Admitting: *Deleted

## 2016-07-24 NOTE — Addendum Note (Signed)
Addended by: Dannielle Karvonen on: 07/24/2016 02:14 PM   Modules accepted: Orders

## 2016-08-21 DIAGNOSIS — Z1211 Encounter for screening for malignant neoplasm of colon: Secondary | ICD-10-CM | POA: Diagnosis not present

## 2016-09-22 ENCOUNTER — Telehealth: Payer: Self-pay | Admitting: Internal Medicine

## 2016-09-22 NOTE — Telephone Encounter (Signed)
Called Pt to ask about how he is doing with his diabetic management. Pt states that he check his blood sugar and blood pressure only when he goes to Dr's office for visit. He states [facility].  Pt reports that he has glucometer at home but he does not use it. We offer Pt teaching about how to use the glucometer to check his blood sugar and he states that he does not really need that.  Pt states that he checks with a doctor only once a year. We tell Pt that he may need to check more often for his diabetic management. We offer to [facility] to see a doctor and pt is not interested.   Pt denies the need of any help. States that he is still taking the metformin pills and the lisinopril.  Dallas BreedingGup-pens Ayeisha Lindenberger, Student Nurse [employer]  09/22/2016 at 15:47

## 2016-09-24 ENCOUNTER — Other Ambulatory Visit: Payer: Self-pay

## 2016-09-24 NOTE — Patient Outreach (Signed)
[facility]N) Care Management  09/24/2016  Juan [name] 08/03/1951 [id number]     Telephone Screen  Referral Date: 09/23/16 Referral Source: MD office(Dr. Laural BenesJohnson) Referral Reason: " DM, HTN, medication adherence, chronic disease management, potential barriers"    Outreach attempt # 1 to patient. Recorded message stating "not accepting calls at this time."      Plan: RN CM will make outreach attempt call to patient within a week.  Antionette Fairyoshanda Geneieve Duell, RN,BSN,CCM [facility] Care Management Telephonic Care Management Coordinator Direct Phone: [phone] Toll Free: [phone] Fax: [phone]

## 2016-09-28 ENCOUNTER — Other Ambulatory Visit: Payer: Self-pay

## 2016-09-28 NOTE — Patient Outreach (Addendum)
[facility]N) Care Management  09/28/2016  Juan SarnaYNguan Nahar 12/17/1951 [id number]   Telephone Screen  Referral Date: 09/23/16 Referral Source: MD office(Dr. Laural BenesJohnson) Referral Reason: " DM, HTN, medication adherence, chronic disease management, potential barriers"    Outreach attempt # 1 to patient. Recorded message stating "not accepting calls at this time."    Plan: RN CM will make outreach attempt to patient again within a week.  Antionette Fairyoshanda Boyde Grieco, RN,BSN,CCM [facility] Care Management Telephonic Care Management Coordinator Direct Phone: [phone] Toll Free: [phone] Fax: [phone]

## 2016-09-29 ENCOUNTER — Ambulatory Visit: Payer: Self-pay

## 2016-10-01 ENCOUNTER — Other Ambulatory Visit: Payer: Self-pay

## 2016-10-01 NOTE — Patient Outreach (Signed)
[facility]N) Care Management  10/01/2016  Juan [name] 10/25/1951 [id number]   Telephone Screen  Referral Date: 09/23/16 Referral Source: MD office(Dr. Laural BenesJohnson) Referral Reason: " DM, HTN, medication adherence, chronic disease management, potential barriers"    Outreach attempt # 3 to patient. Recording stating "not accepting calls at this time." Contacted emergency contact for patient. No answer. RN CM has been unsuccessful at outreach attempt to reach patient.   Plan: RN CM will send unsuccessful outreach letter to patient.  RN CM will close case if no response from patient within 10 business days.   Juan Fairyoshanda Inmer Nix, RN,BSN,CCM [facility] Care Management Telephonic Care Management Coordinator Direct Phone: [phone] Toll Free: [phone] Fax: [phone]

## 2016-10-15 ENCOUNTER — Other Ambulatory Visit: Payer: Self-pay

## 2016-10-15 NOTE — Patient Outreach (Signed)
[facility]) Care Management  10/15/2016  Juan [name] 02/01/1951 [id number]   Telephone Screen  Referral Date: 09/23/16 Referral Source: MD office(Dr. Laural BenesJohnson) Referral Reason: " DM, HTN, medication adherence, chronic disease management, potential barriers"    Multiple attempts to establish contact with patient. No response from letter mailed to patient. Case is being closed at this time.    Plan: RN CM will notify [facility] administrative assistant of case closure. RN CM will send MD case closure letter.   Juan Fairyoshanda Earnesteen Birnie, RN,BSN,CCM [facility] Care Management Telephonic Care Management Coordinator Direct Phone: [phone] Toll Free: [phone] Fax: [phone]

## 2016-10-30 ENCOUNTER — Ambulatory Visit (INDEPENDENT_AMBULATORY_CARE_PROVIDER_SITE_OTHER): Payer: PPO | Admitting: Internal Medicine

## 2016-10-30 ENCOUNTER — Ambulatory Visit: Payer: PPO | Admitting: Dietician

## 2016-10-30 ENCOUNTER — Other Ambulatory Visit: Payer: Self-pay | Admitting: Dietician

## 2016-10-30 ENCOUNTER — Encounter: Payer: Self-pay | Admitting: Dietician

## 2016-10-30 VITALS — BP 129/69 | HR 70 | Temp 98.2°F | Wt 150.1 lb

## 2016-10-30 DIAGNOSIS — I69351 Hemiplegia and hemiparesis following cerebral infarction affecting right dominant side: Secondary | ICD-10-CM | POA: Diagnosis not present

## 2016-10-30 DIAGNOSIS — I10 Essential (primary) hypertension: Secondary | ICD-10-CM

## 2016-10-30 DIAGNOSIS — Z6826 Body mass index (BMI) 26.0-26.9, adult: Secondary | ICD-10-CM | POA: Diagnosis not present

## 2016-10-30 DIAGNOSIS — Z87891 Personal history of nicotine dependence: Secondary | ICD-10-CM

## 2016-10-30 DIAGNOSIS — Z23 Encounter for immunization: Secondary | ICD-10-CM | POA: Diagnosis not present

## 2016-10-30 DIAGNOSIS — Z7984 Long term (current) use of oral hypoglycemic drugs: Secondary | ICD-10-CM

## 2016-10-30 DIAGNOSIS — E1151 Type 2 diabetes mellitus with diabetic peripheral angiopathy without gangrene: Secondary | ICD-10-CM

## 2016-10-30 DIAGNOSIS — Z0000 Encounter for general adult medical examination without abnormal findings: Secondary | ICD-10-CM

## 2016-10-30 DIAGNOSIS — Z79899 Other long term (current) drug therapy: Secondary | ICD-10-CM

## 2016-10-30 LAB — GLUCOSE, CAPILLARY: GLUCOSE-CAPILLARY: 234 mg/dL — AB (ref 65–99)

## 2016-10-30 LAB — HM DIABETES EYE EXAM

## 2016-10-30 LAB — POCT GLYCOSYLATED HEMOGLOBIN (HGB A1C): Hemoglobin A1C: 7

## 2016-10-30 NOTE — Patient Instructions (Signed)
Please continue to take your medications as prescribed. Please stay active and avoid salty and greasy foods in your diet. Please return in 3 months.

## 2016-10-30 NOTE — Progress Notes (Signed)
   CC: Health maintenance visit  HPI:  Juan [name] is a 65 y.o. male with PMHx detailed below presenting with for check up visit / health maintenance. He states that he is feeling quite well, tolerating all of his medications.   [employer] [place] interpreter present and assisting with this encounter  See problem based assessment and plan below for additional details.  Past Medical History:  Diagnosis Date  . Carotid artery occlusion   . Diabetes mellitus without complication (HCC)    Type II, takes metformin per patient(11/01/15)  . Hyperlipemia   . Hypertension   . Stroke [facility](HCC) 06/2013   Left side- weak, unable to make a fist    Review of Systems: Review of Systems  Constitutional: Negative for chills, fever, malaise/fatigue and weight loss.  Eyes: Negative for blurred vision.  Respiratory: Negative for cough, sputum production and shortness of breath.   Cardiovascular: Negative for chest pain, palpitations and leg swelling.  Gastrointestinal: Negative for abdominal pain, blood in stool, constipation, diarrhea, melena, nausea and vomiting.  Genitourinary: Negative for dysuria and hematuria.  Neurological: Positive for focal weakness (RUE, chronic from previous stroke). Negative for dizziness and weakness.  Psychiatric/Behavioral: Negative for depression and substance abuse. The patient is not nervous/anxious.   All other systems reviewed and are negative.    Physical Exam: Vitals:   10/30/16 1510  BP: 129/69  Pulse: 70  Temp: 98.2 F (36.8 C)  TempSrc: Oral  SpO2: 100%  Weight: 150 lb 1.6 oz (68.1 kg)   Body mass index is 26.17 kg/m. GENERAL- Well-dressed gentleman sitting comfortably in wheelchair, alert, in no distress, with family and bag of medication HEENT- Atraumatic, PERRL, EOMI, moist mucous membranes CARDIAC- Regular rate and rhythm, no murmurs, rubs or gallops. RESP- Clear to ascultation bilaterally, no wheezing or crackles, normal work of  breathing ABDOMEN- Normoactive bowel sounds, soft, nontender, nondistended BACK- Normal curvature, no paraspinal tenderness, no CVA tenderness. NEURO- Alert and oriented, residual loss of RUE forearm, wrist, hand strength, otherwise intact EXTREMITIES- Normal bulk and range of motion, no edema, 2+ peripheral pulses SKIN- Warm, dry, intact, without visible rash PSYCH- Appropriate affect, clear speech, thoughts linear and goal-directed  Assessment & Plan:   See encounters tab for problem based medical decision making.  Patient seen with Dr. Heide SparkNarendra

## 2016-10-30 NOTE — Progress Notes (Signed)
retintal images done and transmitted.

## 2016-10-30 NOTE — Assessment & Plan Note (Addendum)
Bp 129/69 today, at goal. Continue Lisinopril 20 mg daily. Reports that he has made significant diet changes since earlier this year, much less salt.

## 2016-10-31 NOTE — Assessment & Plan Note (Addendum)
HbA1c 7.0 today, at goal, previously uncontrolled with poor medication compliance. Now taking his Metformin 1000mg  BID and Glipizide 5mg  QD without side effects. States that he is staying active and walks in the park often. Eye exam with Lupita LeashDonna today. Denies symptoms of neuropathy in his feet at this time.  Plan: - Continue current regimen - Formal diabetic foot exam at next visit - A1c in three months - Encouraged continued activity and diet modification - Repeat BMP, microalbumin at next visit - Follow up in 2-4 months

## 2016-10-31 NOTE — Assessment & Plan Note (Signed)
Provided flu vaccine, also sent home with FOBT x3 for yearly colon cancer screening (last negative 2015)

## 2016-11-02 NOTE — Progress Notes (Signed)
Internal Medicine Clinic Attending  I saw and evaluated the patient.  I personally confirmed the key portions of the history and exam documented by Dr. Johnson and I reviewed pertinent patient test results.  The assessment, diagnosis, and plan were formulated together and I agree with the documentation in the resident's note.  

## 2016-11-09 ENCOUNTER — Encounter: Payer: Self-pay | Admitting: Dietician

## 2016-11-25 ENCOUNTER — Telehealth: Payer: Self-pay

## 2016-11-27 ENCOUNTER — Other Ambulatory Visit: Payer: Self-pay | Admitting: Pharmacist

## 2016-11-27 DIAGNOSIS — E1151 Type 2 diabetes mellitus with diabetic peripheral angiopathy without gangrene: Secondary | ICD-10-CM

## 2016-11-27 DIAGNOSIS — I10 Essential (primary) hypertension: Secondary | ICD-10-CM

## 2016-11-27 MED ORDER — LISINOPRIL 20 MG PO TABS: 20.0000 mg | ORAL_TABLET | Freq: Every day | ORAL | 3 refills | Status: DC

## 2016-11-27 MED ORDER — GLIPIZIDE 5 MG PO TABS: 5.0000 mg | ORAL_TABLET | Freq: Every day | ORAL | 3 refills | Status: DC

## 2016-11-27 NOTE — Progress Notes (Signed)
Juan [name] is a 65 y.o. male who was contacted on behalf of [facility]. Patient states he is doing well and declined help with medications at this time. He reports needing refills on lisinopril and glipizide. Will coordinate.

## 2016-12-10 ENCOUNTER — Other Ambulatory Visit: Payer: Self-pay | Admitting: *Deleted

## 2016-12-10 DIAGNOSIS — E1151 Type 2 diabetes mellitus with diabetic peripheral angiopathy without gangrene: Secondary | ICD-10-CM

## 2016-12-10 DIAGNOSIS — E785 Hyperlipidemia, unspecified: Secondary | ICD-10-CM

## 2016-12-10 DIAGNOSIS — E1169 Type 2 diabetes mellitus with other specified complication: Secondary | ICD-10-CM

## 2016-12-10 MED ORDER — ATORVASTATIN CALCIUM 80 MG PO TABS: 80.0000 mg | ORAL_TABLET | Freq: Every day | ORAL | 1 refills | Status: DC

## 2016-12-10 MED ORDER — METFORMIN HCL 1000 MG PO TABS: 1000.0000 mg | ORAL_TABLET | Freq: Two times a day (BID) | ORAL | 1 refills | Status: DC

## 2017-02-04 ENCOUNTER — Telehealth: Payer: Self-pay | Admitting: *Deleted

## 2017-02-04 NOTE — Telephone Encounter (Signed)
Juan [name] is a 66 y.o. male who was contacted on behalf of [facility].   Call to patient to check on for the Geriatric Task Force.  Patient stated  he is doing [name].  Is taking all of his medications at this time.  Is feeling well.  Does not think he is experiencing any problems with his blood sugars or his blood pressure.  Patient said that he is drinking plenty of water when asked , eating vegatables and not using a lot of salt.  Patient was asked about his next appointment for follow up.  Stated he did not know.  Agreed to schedule an appointment with his doctor today.  Patient was asked if he had any questions for me stated no and has plenty of medicine.  Patient is scheduled for an appointment on 02/19/2017 in the Clinics.   Juan OkGladys Emelin Dascenzo, RN 02/04/2017 1:54 PM

## 2017-02-19 ENCOUNTER — Encounter: Payer: Self-pay | Admitting: Internal Medicine

## 2017-02-19 ENCOUNTER — Encounter: Payer: PPO | Admitting: Internal Medicine

## 2017-05-04 ENCOUNTER — Encounter (INDEPENDENT_AMBULATORY_CARE_PROVIDER_SITE_OTHER): Payer: Self-pay

## 2017-05-04 ENCOUNTER — Ambulatory Visit (INDEPENDENT_AMBULATORY_CARE_PROVIDER_SITE_OTHER): Payer: PPO | Admitting: Internal Medicine

## 2017-05-04 VITALS — BP 155/77 | HR 63 | Temp 98.3°F | Ht 65.0 in | Wt 155.3 lb

## 2017-05-04 DIAGNOSIS — E1151 Type 2 diabetes mellitus with diabetic peripheral angiopathy without gangrene: Secondary | ICD-10-CM | POA: Diagnosis not present

## 2017-05-04 DIAGNOSIS — Z87891 Personal history of nicotine dependence: Secondary | ICD-10-CM | POA: Diagnosis not present

## 2017-05-04 DIAGNOSIS — Z8673 Personal history of transient ischemic attack (TIA), and cerebral infarction without residual deficits: Secondary | ICD-10-CM

## 2017-05-04 DIAGNOSIS — Z79899 Other long term (current) drug therapy: Secondary | ICD-10-CM

## 2017-05-04 DIAGNOSIS — Z7984 Long term (current) use of oral hypoglycemic drugs: Secondary | ICD-10-CM

## 2017-05-04 DIAGNOSIS — Z7982 Long term (current) use of aspirin: Secondary | ICD-10-CM

## 2017-05-04 DIAGNOSIS — I10 Essential (primary) hypertension: Secondary | ICD-10-CM | POA: Diagnosis not present

## 2017-05-04 LAB — POCT GLYCOSYLATED HEMOGLOBIN (HGB A1C): Hemoglobin A1C: 8.5

## 2017-05-04 LAB — GLUCOSE, CAPILLARY: Glucose-Capillary: 190 mg/dL — ABNORMAL HIGH (ref 65–99)

## 2017-05-04 MED ORDER — LISINOPRIL 20 MG PO TABS: 20.0000 mg | ORAL_TABLET | Freq: Every day | ORAL | 3 refills | Status: DC

## 2017-05-04 MED ORDER — METFORMIN HCL 1000 MG PO TABS: 1000.0000 mg | ORAL_TABLET | Freq: Two times a day (BID) | ORAL | 1 refills | Status: DC

## 2017-05-04 MED ORDER — GLIPIZIDE 5 MG PO TABS: 5.0000 mg | ORAL_TABLET | Freq: Every day | ORAL | 3 refills | Status: DC

## 2017-05-04 NOTE — Assessment & Plan Note (Signed)
Blood pressure is elevated today 144/83 and persistently elevated on recheck, 155/77. Patient initially reported compliance with all of his medications, however, on further questioning admitted to being out of his lisinopril for 1 month.  History was somewhat limited due to language barrier and obtained with the help of his son acting as Nurse, learning disabilitytranslator (with patient permission and signed release).  -- Refilled lisinopril 20 mg daily, encouraged compliance  -- F/u PCP

## 2017-05-04 NOTE — Patient Instructions (Addendum)
Mr. Juan [name],  It was a pleasure to see you today. I have sent refills of your medications to your pharmacy. Please continue to take all of your medications as previously prescribed. I will call you with the results of your blood work today. Please follow up with your primary care doctor in 3 months for follow up. If you have any questions or concerns, call our clinic at [phone] or after hours call [phone] and ask for the internal medicine resident on call. Thank you!  - Dr. Antony ContrasGuilloud

## 2017-05-04 NOTE — Assessment & Plan Note (Addendum)
Patient is here today for diabetes follow-up. He is prescribed metformin 1000 mg twice a day and glipizide 5 mg daily. He reports compliance. Last A1c checked in November 2017 was 7.0.  -- Refilled metformin 1000 mg BID -- Refilled glipizide 5 mg daily  -- f/u A1C, BMP, and repeat urine microalbumin today   ADDENDUM: A1c increased from 7.0 six months ago to 8.5 today. BMP with stable renal function. microalbumin/creatinine ratio has improved. Continue current regimen and ACEi. F/u with PCP in 3 months. Updated patient with lab results.

## 2017-05-04 NOTE — Progress Notes (Signed)
Patient ID: Juan SarnaYNguan [name], male   DOB: 10/21/1951, 66 y.o.   MRN: [id number]  Case discussed with Dr. Antony ContrasGuilloud at the time of the visit. We reviewed the resident's history and exam and pertinent patient test results. I agree with the assessment, diagnosis, and plan of care documented in the resident's note.

## 2017-05-04 NOTE — Progress Notes (Signed)
   CC: BP, DM follow up   HPI:  [name] is a 66 y.o. male with past medical history outlined below here for BP and DM follow up. For the details of today's visit, please refer to the assessment and plan.  Past Medical History:  Diagnosis Date  . Carotid artery occlusion   . Diabetes mellitus without complication (HCC)    Type II, takes metformin per patient(11/01/15)  . Hyperlipemia   . Hypertension   . Stroke [facility](HCC) 06/2013   Left side- weak, unable to make a fist    Review of Systems:  All pertinents listed in HPI, otherwise negative  Physical Exam:  Vitals:   05/04/17 1043  BP: (!) 144/83  Pulse: 65  Temp: 98.3 F (36.8 C)  TempSrc: Oral  SpO2: 97%  Weight: 155 lb 4.8 oz (70.4 kg)  Height: 5\' 5"  (1.651 m)    Constitutional: NAD, appears comfortable Cardiovascular: RRR, no murmurs, rubs, or gallops.  Pulmonary/Chest: CTAB, no wheezes, rales, or rhonchi.  Abdominal: Soft, non tender, non distended. +BS.  Extremities: Warm and well perfused. No edema.  Neurological: A&Ox3, CN II - XII grossly intact.   Assessment & Plan:   See Encounters Tab for problem based charting.  Patient discussed with Dr. Josem KaufmannKlima

## 2017-05-04 NOTE — Assessment & Plan Note (Signed)
Patient reports compliance with his aspirin 81 mg daily and Lipitor 80 mg daily. Continue current regimen.

## 2017-05-05 LAB — BMP8+ANION GAP
ANION GAP: 14 mmol/L (ref 10.0–18.0)
BUN/Creatinine Ratio: 11 (ref 10–24)
BUN: 14 mg/dL (ref 8–27)
CHLORIDE: 95 mmol/L — AB (ref 96–106)
CO2: 27 mmol/L (ref 18–29)
Calcium: 10.1 mg/dL (ref 8.6–10.2)
Creatinine, Ser: 1.29 mg/dL — ABNORMAL HIGH (ref 0.76–1.27)
GFR, EST AFRICAN AMERICAN: 66 mL/min/{1.73_m2} (ref >59)
GFR, EST NON AFRICAN AMERICAN: 57 mL/min/{1.73_m2} — AB (ref >59)
GLUCOSE: 194 mg/dL — AB (ref 65–99)
POTASSIUM: 4.8 mmol/L (ref 3.5–5.2)
SODIUM: 136 mmol/L (ref 134–144)

## 2017-05-05 LAB — MICROALBUMIN / CREATININE URINE RATIO
Creatinine, Urine: 129.3 mg/dL
Microalb/Creat Ratio: 935 mg/g creat — ABNORMAL HIGH (ref 0.0–30.0)
Microalbumin, Urine: 1208.9 ug/mL

## 2017-05-06 ENCOUNTER — Other Ambulatory Visit: Payer: Self-pay | Admitting: Pharmacist

## 2017-05-06 DIAGNOSIS — E1169 Type 2 diabetes mellitus with other specified complication: Secondary | ICD-10-CM

## 2017-05-06 DIAGNOSIS — E785 Hyperlipidemia, unspecified: Principal | ICD-10-CM

## 2017-05-06 MED ORDER — ATORVASTATIN CALCIUM 80 MG PO TABS: 80.0000 mg | ORAL_TABLET | Freq: Every day | ORAL | 3 refills | Status: DC

## 2017-05-19 ENCOUNTER — Encounter: Payer: Self-pay | Admitting: Family

## 2017-06-01 ENCOUNTER — Ambulatory Visit: Payer: PPO | Admitting: Family

## 2017-06-01 ENCOUNTER — Ambulatory Visit (HOSPITAL_COMMUNITY): Admission: RE | Admit: 2017-06-01 | Payer: PPO | Source: Ambulatory Visit

## 2017-06-07 ENCOUNTER — Encounter: Payer: Self-pay | Admitting: *Deleted

## 2017-08-09 ENCOUNTER — Ambulatory Visit (INDEPENDENT_AMBULATORY_CARE_PROVIDER_SITE_OTHER): Payer: PPO | Admitting: Internal Medicine

## 2017-08-09 ENCOUNTER — Encounter: Payer: Self-pay | Admitting: Internal Medicine

## 2017-08-09 VITALS — BP 185/72 | HR 66 | Temp 98.3°F | Wt 155.4 lb

## 2017-08-09 DIAGNOSIS — I63239 Cerebral infarction due to unspecified occlusion or stenosis of unspecified carotid arteries: Secondary | ICD-10-CM

## 2017-08-09 DIAGNOSIS — Z1211 Encounter for screening for malignant neoplasm of colon: Secondary | ICD-10-CM

## 2017-08-09 DIAGNOSIS — Z79899 Other long term (current) drug therapy: Secondary | ICD-10-CM

## 2017-08-09 DIAGNOSIS — Z8673 Personal history of transient ischemic attack (TIA), and cerebral infarction without residual deficits: Secondary | ICD-10-CM

## 2017-08-09 DIAGNOSIS — I10 Essential (primary) hypertension: Secondary | ICD-10-CM

## 2017-08-09 DIAGNOSIS — E1151 Type 2 diabetes mellitus with diabetic peripheral angiopathy without gangrene: Secondary | ICD-10-CM

## 2017-08-09 DIAGNOSIS — Z7982 Long term (current) use of aspirin: Secondary | ICD-10-CM | POA: Diagnosis not present

## 2017-08-09 DIAGNOSIS — Z87891 Personal history of nicotine dependence: Secondary | ICD-10-CM

## 2017-08-09 DIAGNOSIS — Z7984 Long term (current) use of oral hypoglycemic drugs: Secondary | ICD-10-CM | POA: Diagnosis not present

## 2017-08-09 DIAGNOSIS — Z1159 Encounter for screening for other viral diseases: Secondary | ICD-10-CM | POA: Insufficient documentation

## 2017-08-09 DIAGNOSIS — Z23 Encounter for immunization: Secondary | ICD-10-CM | POA: Diagnosis not present

## 2017-08-09 LAB — POCT GLYCOSYLATED HEMOGLOBIN (HGB A1C): Hemoglobin A1C: 7.2

## 2017-08-09 LAB — GLUCOSE, CAPILLARY: Glucose-Capillary: 123 mg/dL — ABNORMAL HIGH (ref 65–99)

## 2017-08-09 MED ORDER — LISINOPRIL-HYDROCHLOROTHIAZIDE 20-12.5 MG PO TABS: 2.0000 | ORAL_TABLET | Freq: Every day | ORAL | 3 refills | Status: DC

## 2017-08-09 NOTE — Progress Notes (Signed)
   CC: DM and HTN follow up   HPI:  [name] is a 66 y.o. male with PMH as described below who present to clinic for DM and HTN follow up. Please see problem-based assessment and plan for further details.   Past Medical History:  Diagnosis Date  . Carotid artery occlusion   . Diabetes mellitus without complication (HCC)    Type II, takes metformin per patient(11/01/15)  . Hyperlipemia   . Hypertension   . Stroke [facility](HCC) 06/2013   Left side- weak, unable to make a fist   Review of Systems:   Review of Systems  Constitutional: Negative for chills, fever and weight loss.  Eyes: Negative for blurred vision and double vision.  Respiratory: Negative for cough and shortness of breath.   Cardiovascular: Negative for chest pain, palpitations and leg swelling.  Gastrointestinal: Negative for abdominal pain, constipation, diarrhea, nausea and vomiting.  Musculoskeletal: Negative for joint pain and myalgias.  Neurological: Negative for dizziness, tingling, sensory change and focal weakness.    Physical Exam:  Vitals:   08/09/17 1332  BP: (!) 185/72  Pulse: 66  Temp: 98.3 F (36.8 C)  TempSrc: Oral  SpO2: 99%  Weight: 155 lb 6.4 oz (70.5 kg)   General: pleasant male, well-nourished, well-developed, in no acute distress  HENT: NCAT,neck supple and FROM, MMM, OP clear without exudates or erythema Cardiac: regular rate and rhythm, nl S1/S2, no murmurs, rubs or gallops  Pulm: CTAB, no wheezes or crackles, no increased work of breathing  Abd: soft, NTND, bowel sounds present   Ext: feet cool to the touch, no peripheral edema, 1+ DP and PT pulses bilaterally    Assessment & Plan:   See Encounters Tab for problem based charting.  Patient seen with Dr. Rogelia BogaButcher

## 2017-08-09 NOTE — Assessment & Plan Note (Addendum)
F/u anti-HCV Ab. Will call patient if abnormal results   Anti-HCV Ab negative

## 2017-08-09 NOTE — Patient Instructions (Addendum)
It was nice to meet you today, Mr. Juan [name].   Please start taking metformin 1,000mg  two times a day, 1 tablet in the morning and one tablet at night.   Please start taking glipizide 5mg  once a day (1 tablet every day).   STOP taking lisinopril 20mg . START taking lisinopril-hydrochlorothiazide 20-12.5mg . Please take 2 tablets once a day.   We might have to make changes to your diabetes medications after lab work results are back. I will call you if we need to make changes to your medications.   You received Td booster today and Pneumovax vaccine.   [facility] and ask to speak with Dr. Evelene CroonSantos if you have any questions about the changes we made to your medications.   Follow up with me in 3 months.

## 2017-08-09 NOTE — Assessment & Plan Note (Addendum)
BP 185/72 at this visit. Currently on lisinopril 20mg  and reports compliance. States took BP med this AM.   - Will check BMP. Will call patient if abnormal results.  - STOP lisinopril 20mg  daily  - START lisinopril-HCTZ 20-12.5mg  BID  - F/u in 3 months   BMP with Cr back to baseline at 1.16

## 2017-08-09 NOTE — Assessment & Plan Note (Addendum)
Patient here for DM follow-up. Currently on metformin 1000mg  BID and glipizide 5mg  daily. Reports compliance with meds. However, on further questioning patient reports taking metformin once a day and glipizide BID. Lives with his daughter, though unclear how involved she is in patient medical care. Last A1c 8.5 04/2017. Denies polyuria, polydipsia, abdominal pain, and N/V.    - POC A1c 7.2 today. Will continue metformin 1000mg  BID and glipizide 5mg . Educated patient on how to take DM meds with use of an interpreter. Patient voiced understanding. Also provided written instructions.  - Foot exam performed today  - Due for ophthalmologic exam  - Consider ABIs at next visit  - [facility]  - F/u BMP and lipid panel. Will call patient if abnormal results. Patient gave permission to speak with son about his test results.  - F/u in 3 months   BMP nl.  Lipid panel with cholesterol 207, TG 316, HDL 31, LDL 113. Patient currently on high intensity statin- atorva 80.

## 2017-08-09 NOTE — Assessment & Plan Note (Addendum)
Td booster and pneumovax given 08/09/2017

## 2017-08-09 NOTE — Assessment & Plan Note (Signed)
Patient follows up with vascular surgery. Per patient, this problem is currently stable and he was instructed to follow up in 1 year. Compliant with ASA and statin therapy. Please refer to vascular surgery note from 05/26/2016 for further details.

## 2017-08-09 NOTE — Assessment & Plan Note (Signed)
Per patient report, [facility] that was normal. No record of this in chart.   - Will obtain records

## 2017-08-10 ENCOUNTER — Other Ambulatory Visit: Payer: Self-pay | Admitting: *Deleted

## 2017-08-10 LAB — LIPID PANEL
Chol/HDL Ratio: 6.7 ratio — ABNORMAL HIGH (ref 0.0–5.0)
Cholesterol, Total: 207 mg/dL — ABNORMAL HIGH (ref 100–199)
HDL: 31 mg/dL — AB (ref >39)
LDL Calculated: 113 mg/dL — ABNORMAL HIGH (ref 0–99)
Triglycerides: 316 mg/dL — ABNORMAL HIGH (ref 0–149)
VLDL Cholesterol Cal: 63 mg/dL — ABNORMAL HIGH (ref 5–40)

## 2017-08-10 LAB — BMP8+ANION GAP
ANION GAP: 14 mmol/L (ref 10.0–18.0)
BUN/Creatinine Ratio: 9 — ABNORMAL LOW (ref 10–24)
BUN: 10 mg/dL (ref 8–27)
CHLORIDE: 100 mmol/L (ref 96–106)
CO2: 25 mmol/L (ref 20–29)
Calcium: 9.6 mg/dL (ref 8.6–10.2)
Creatinine, Ser: 1.16 mg/dL (ref 0.76–1.27)
GFR calc non Af Amer: 65 mL/min/{1.73_m2} (ref >59)
GFR, EST AFRICAN AMERICAN: 75 mL/min/{1.73_m2} (ref >59)
GLUCOSE: 119 mg/dL — AB (ref 65–99)
POTASSIUM: 5 mmol/L (ref 3.5–5.2)
Sodium: 139 mmol/L (ref 134–144)

## 2017-08-10 LAB — HEPATITIS C ANTIBODY: HEP C VIRUS AB: 0.1 {s_co_ratio} (ref 0.0–0.9)

## 2017-08-10 NOTE — Patient Outreach (Signed)
[facility]) Care Management  08/10/2017  Dwain SarnaYNguan Verbeek 03/16/1951 [id number]  Telephone Screen  Referral Date: 08/09/17 Referral Source: MD office (Dr. Burna CashIdalys Santos-Sanchez Referral Reason: DM with PVD, Uncontrolled HTN,  Insurance: HTA  Outreach attempt #1 to patient. No answer. RN CM left HIPAA compliant message along with contact info.    Plan: RN CM will contact patient within one week.  Wynelle ClevelandJuanita Rajendra Spiller, RN, BSN, MHA/MSL, [facility] Telephonic Care Manager Coordinator [facility] Direct Phone: [phone] Toll Free: [phone] Fax: [phone]

## 2017-08-11 NOTE — Progress Notes (Signed)
Internal Medicine Clinic Attending  I saw and evaluated the patient.  I personally confirmed the key portions of the history and exam documented by Dr. Santos-Sancheza and I reviewed pertinent patient test results.  The assessment, diagnosis, and plan were formulated together and I agree with the documentation in the resident's note. 

## 2017-08-19 ENCOUNTER — Other Ambulatory Visit: Payer: Self-pay | Admitting: *Deleted

## 2017-08-19 ENCOUNTER — Ambulatory Visit: Payer: Self-pay | Admitting: *Deleted

## 2017-08-19 NOTE — Patient Outreach (Addendum)
[facility]) Care Management  08/19/2017  Juan [name] 06/06/1951 [id number]   Telephone Screen  Referral Date: 08/09/17 Referral Source: MD office (Dr. Burna Cash Referral Reason: DM with PVD, Uncontrolled HTN,  Insurance: HTA  Outreach attempt #2  to patient. Dropped phone call during screening assessment. Attempted to call patient back, no answer. RN CM left HIPAA compliant message along with contact info.   RN CM contacted  patient's PCP and requested for the CMA to contact the patient regarding referral to [facility].  Plan: RN CM will contact patient within one week.   Wynelle Cleveland, RN, BSN, MHA/MSL, [facility] Telephonic Care Manager Coordinator [facility] Direct Phone: [phone] Toll Free: [phone] Fax: [phone]

## 2017-08-20 ENCOUNTER — Other Ambulatory Visit: Payer: Self-pay | Admitting: *Deleted

## 2017-08-20 NOTE — Patient Outreach (Signed)
[facility]) Care Management  08/20/2017  Juan [name] October 05, 1951 [id number]  Telephone Screen  Care Coordination Referral Date: 08/09/17 Referral Source: MD office (Dr. Burna Cash Referral Reason: DM with PVD, Uncontrolled HTN,  Insurance: HTA  RN CM spoke with May at Dr. Dicky Doe office. May was able to contact patient after RN CM Dorann Lodge) spoke with her on 08/19/17 regarding patient's knowledge of referral. May stated, patient consented to [facility] services. May stated, patient needs face to face interaction rather than speaking on the phone. May discussed how patient stated he can speak [place], however comprehension of medical terminology is apprehensive. 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 May reported, she is fluent in [place] and can assist, if needed. May spoke about being concerned regarding son's willingness to assist his father. RN CM Dorann Lodge) attempted to complete screening with patient, however phone call was dropped. RN CM Dorann Lodge) attempted to call patient back, unsuccessfully.   Plan: RN CM will send referral to [facility] RN for further in home eval/assessment of care needs and management of chronic conditions.  Wynelle Cleveland, RN, BSN, MHA/MSL, [facility] Telephonic Care Manager Coordinator [facility] Direct Phone: [phone] Toll Free: [phone] Fax: [phone]

## 2017-08-24 ENCOUNTER — Other Ambulatory Visit: Payer: Self-pay

## 2017-08-24 ENCOUNTER — Other Ambulatory Visit: Payer: Self-pay | Admitting: Pharmacist

## 2017-08-24 NOTE — Patient Outreach (Signed)
[facility] Care Management  08/24/2017  Winton Pasion 1951-01-28 [id number]  Care coordination call with Candise Bowens, PharmD in internal medicine clinic regarding clarification on referral from PCP.   PharmD in clinic reports clinic is unsure about home situation and over all medication adherence, especially given elevated blood pressure.   Will coordinate with [facility] RN Pam and attempt to schedule co-home visit with patient and [facility] RN to better assess medication adherence.   Plan:  Follow-up with [facility] RN Pam regarding home visit.   Tommye Standard, PharmD, [facility] Clinical Pharmacist [facility] [phone]

## 2017-08-25 NOTE — Patient Outreach (Signed)
    Telephone call to patient for introductions and to schedule a home visit for community care coordination. Patient also advised that pharmacist, Tommye StandardKevin Ruedinger, [facility] would also be in attendance. Patient verbally consented to home visit which is scheduled for the next 28 days.

## 2017-08-26 ENCOUNTER — Ambulatory Visit: Payer: Self-pay | Admitting: *Deleted

## 2017-08-31 ENCOUNTER — Other Ambulatory Visit: Payer: Self-pay | Admitting: Pharmacist

## 2017-08-31 ENCOUNTER — Ambulatory Visit: Payer: Self-pay

## 2017-08-31 NOTE — Patient Outreach (Signed)
[facility]) Care Management  [facility]   08/31/2017  Juan [name] 30-Oct-1951 [id number]  Subjective:  Initial home visit completed with patient and patient requested his son, Juan [name], be present.   Patient was referred to [facility] CM Pharmacist for medication management concerns by his PCP.   Noted patient at times has a Nurse, learning disability present per PCP notes---when translation services were offered to patient via [employer], patient declined need for use of a Nurse, learning disability.    Patient's past medical history is significant for:  Hypertension, Type 2 diabetes mellitus, CAD.    Patient reports at time of home visit, he is only taking two medications, lisinopril 20 mg daily and lisinopril/hydrochlorothiazide 20/12.5 mg 2 tabs daily.   Objective:   Encounter Medications: Outpatient Encounter Prescriptions as of 08/31/2017  Medication Sig  . aspirin 81 MG EC tablet Take 1 tablet (81 mg total) by mouth daily.  Marland Kitchen atorvastatin (LIPITOR) 80 MG tablet Take 1 tablet (80 mg total) by mouth daily.  . cyclobenzaprine (FLEXERIL) 5 MG tablet Take 1-2 tablets (5-10 mg total) by mouth 3 (three) times daily as needed for muscle spasms.  Marland Kitchen glipiZIDE (GLUCOTROL) 5 MG tablet Take 1 tablet (5 mg total) by mouth daily before breakfast.  . lisinopril-hydrochlorothiazide (ZESTORETIC) 20-12.5 MG tablet Take 2 tablets by mouth daily.  . metFORMIN (GLUCOPHAGE) 1000 MG tablet Take 1 tablet (1,000 mg total) by mouth 2 (two) times daily with a meal.  . traMADol (ULTRAM) 50 MG tablet Take 1 tablet (50 mg total) by mouth every 8 (eight) hours as needed.   No facility-administered encounter medications on file as of 08/31/2017.     Functional Status: In your present state of health, do you have any difficulty performing the following activities: 08/31/2017 08/09/2017  Hearing? N N  Vision? Y N  Difficulty concentrating or making decisions? N N  Walking or climbing stairs? N Y  Comment - uses a cane   Dressing or bathing? N N  Doing errands, shopping? N Y  Some recent data might be hidden    Fall/Depression Screening: Fall Risk  08/31/2017 08/09/2017 05/04/2017  Falls in the past year? No Yes No  Number falls in past yr: - - -  Risk Factor Category  - High Fall Risk -  Risk for fall due to : Medication side effect Impaired balance/gait;Medication side effect -  Follow up - Falls prevention discussed -   PHQ 2/9 Scores 08/19/2017 08/09/2017 05/04/2017 03/28/2016 11/19/2015 08/12/2015 01/28/2015  PHQ - 2 Score 0 0 0 0 0 0 0      Assessment:  Medication review per medication list in chart, and patient report.   Drugs sorted by system:  Cardiovascular: -aspirin -atorvastatin  -lisinopril/hydrochlorothiazide  Endocrine: -glipizide  -metformin  Pain -cyclobenzaprine  -tramadol  Other issues noted:   1) Patient reports he is only taking lisinopril and lisinopril/hydrochlorothiazide.  He did not discontinue lisinopril as per 08/09/17 PCP note and continued it in addition to lisinopril/hydrochlorothiazide.  2) Medication adherence:  Patient had multiple bottles of atorvastatin, aspirin, glipizide, and metformin present---he reports not taking any as he thought he was told to not take these medications anymore.    3)  Patient was counseled PCP stopped lisinopril and changed his blood pressure medication to lisinopril/hydrochlorothiazide--he verbalized understanding to this.    Plan:  1) Placed call to Amada Kingfisher, PharmD with internal medicine clinic.   2) Will route this note to PCP---will request PCP clarify which medication patient should  resume and suggest he have sooner follow-up than 10/2017, especially to monitor renal function and potassium given recent change in blood pressure medication with ACE inhibitor and diuretic.    3)  Will follow-up with patient as needed.   He may not need refills depending on what prescriber wishes patient to take given he had medication in his  possession at time of home visit.    Juan [name], PharmD, [facility] Clinical Pharmacist [facility] [phone]

## 2017-09-03 ENCOUNTER — Telehealth: Payer: Self-pay | Admitting: Internal Medicine

## 2017-09-03 NOTE — Telephone Encounter (Signed)
-----   Message from Darreld McleanKevin S Ruedinger, [facility] sent at 09/01/2017 10:18 AM EDT ----- Dr Lovenia KimSantos-Sanchez:  [facility] completed home visit with patient.  Multiple medication concerns---he didn't stop lisinopril and took in addition to lisinopril/hydrochlorothiazide.   He reports not taking other medications, thought "someone told him to stop them."  Please provide clarification on which medications you would like him to re-start, perhaps, atorvastatin, aspirin, metformin?    Thanks for your time.  Tommye StandardKevin Ruedinger, PharmD, [facility] Center[facility]ACP Clinical Pharmacist [facility] [phone]

## 2017-09-03 NOTE — Progress Notes (Signed)
I received a message from [facility] Pharmacist that patient is not taking medications as prescribed. He is currently taking lisinopril 20mg  and lisinopril-HCTZ 20-12.5mg  BID and stopped taking the rest of his medication which include: metformin 1000 mg BID, glipizide 5 mg , atorvastatin 80 mg daily and aspirin 81 mg daily. At patient's last visit, I stopped his lisinopril and started him on lisinopril-HCTZ 20-12.5 mg BID. No other changes were made to his medications. He came with an interpreter who helped relied all this information to him. I also provided him with a list of all the medications he is supposed to take with instructions on how to take them. I also asked the patient if he wanted the interpreter to translate the information in the med list for him, but he declined. Stated that he lives with one of his children who helps him manage his medications.

## 2017-09-06 ENCOUNTER — Telehealth: Payer: Self-pay | Admitting: Internal Medicine

## 2017-09-06 NOTE — Telephone Encounter (Signed)
-----   Message from Burna CashIdalys Santos-Sanchez, MD sent at 09/03/2017  7:39 PM EDT ----- Llamar a paciente para follow up

## 2017-09-08 ENCOUNTER — Other Ambulatory Visit: Payer: Self-pay | Admitting: Pharmacist

## 2017-09-08 NOTE — Patient Outreach (Signed)
[facility]) Care Management  09/08/2017  Juan SarnaYNguan [name] 02/27/1951 [id number]  Received a reply from PCP regarding patient's medication regimen and which medications he is to be taking.    Attempted to follow-up with patient today.  [facility] Pharmacist can complete home visit with patient to review which medications PCP prescribed.  No answer, HIPAA compliant message left requesting return call.    Noted PCP also unable to contact patient.   Plan:  If no return call, will make another phone outreach next week.  Tommye StandardKevin Yelitza Reach, PharmD, [facility]ACP Clinical Pharmacist [facility] [phone]

## 2017-09-13 ENCOUNTER — Other Ambulatory Visit: Payer: Self-pay | Admitting: Pharmacist

## 2017-09-13 NOTE — Patient Outreach (Signed)
[facility]) Care Management  09/13/2017  Juan [name] 1951/01/10 [id number]  Second unsuccessful phone outreach to patient.  HIPAA compliant message left requesting return call.    Plan:  Will make third phone outreach attempt to reach patient in the next week if no return call.   Tommye Standard, PharmD, [facility] Clinical Pharmacist [facility] [phone]

## 2017-09-14 ENCOUNTER — Ambulatory Visit (INDEPENDENT_AMBULATORY_CARE_PROVIDER_SITE_OTHER): Payer: PPO | Admitting: Internal Medicine

## 2017-09-14 VITALS — BP 133/64 | HR 65 | Temp 97.9°F | Wt 154.9 lb

## 2017-09-14 DIAGNOSIS — Z7984 Long term (current) use of oral hypoglycemic drugs: Secondary | ICD-10-CM

## 2017-09-14 DIAGNOSIS — Z7189 Other specified counseling: Secondary | ICD-10-CM

## 2017-09-14 DIAGNOSIS — Z7982 Long term (current) use of aspirin: Secondary | ICD-10-CM | POA: Diagnosis not present

## 2017-09-14 DIAGNOSIS — I10 Essential (primary) hypertension: Secondary | ICD-10-CM | POA: Diagnosis not present

## 2017-09-14 DIAGNOSIS — Z87891 Personal history of nicotine dependence: Secondary | ICD-10-CM

## 2017-09-14 DIAGNOSIS — Z79899 Other long term (current) drug therapy: Secondary | ICD-10-CM

## 2017-09-14 DIAGNOSIS — E1151 Type 2 diabetes mellitus with diabetic peripheral angiopathy without gangrene: Secondary | ICD-10-CM

## 2017-09-14 DIAGNOSIS — E1169 Type 2 diabetes mellitus with other specified complication: Secondary | ICD-10-CM | POA: Diagnosis not present

## 2017-09-14 DIAGNOSIS — Z91128 Patient's intentional underdosing of medication regimen for other reason: Secondary | ICD-10-CM | POA: Diagnosis not present

## 2017-09-14 DIAGNOSIS — T383X6A Underdosing of insulin and oral hypoglycemic [antidiabetic] drugs, initial encounter: Secondary | ICD-10-CM | POA: Diagnosis not present

## 2017-09-14 DIAGNOSIS — Z8673 Personal history of transient ischemic attack (TIA), and cerebral infarction without residual deficits: Secondary | ICD-10-CM

## 2017-09-14 DIAGNOSIS — E785 Hyperlipidemia, unspecified: Secondary | ICD-10-CM

## 2017-09-14 DIAGNOSIS — E784 Other hyperlipidemia: Secondary | ICD-10-CM

## 2017-09-14 NOTE — Assessment & Plan Note (Addendum)
BP well controlled on lisinopril-HCTZ 40-12.5mg  daily. It appears patient was not taking in conjunction with lisinopril alone, so will not check Bmet today.  Plan: --continue current regimen --f/u with pcp

## 2017-09-14 NOTE — Patient Instructions (Addendum)
These are the medications you should be taking every day:  Aspirin , one pill, once daily Atorvastatin , one pill, once daily Lisinopril-HCTZ 20-12.5, two pills, once daily Metformin , one pill with breakfast and one pill with dinner Glipizide , one pill once a day  Please keep your appointment with Dr. Lovenia Kim in November. If you have any questions or something comes up before your appointment, please don't hesitate to call or come back to the clinic sooner.

## 2017-09-14 NOTE — Assessment & Plan Note (Signed)
Patient instructed to restart his atorvastatin  daily.

## 2017-09-14 NOTE — Assessment & Plan Note (Signed)
Patient has not been taking his metformin and glipizide for 1 month due to confusion over medication instructions. He denies hyperglycemic symptoms.  Plan: --restart glipizide  daily, metformin  BID --f/u with PCP in 2 months

## 2017-09-14 NOTE — Assessment & Plan Note (Signed)
Patient was seen by PCP recently and had med adjustment on his anti-hypertensives (stopped lisinopril, and started lisinopril-HCTZ). [facility] visit to his house noted multiple bottles and that patient was taking both lisinopril and lisinopril-HCTZ and had stopped all other medicines. Patient presents today with an interpreter for facilitation of interview and he brings all of his home medications.   Patient indicates that he has only been taking the lisinopril-HCTZ daily since his last visit and had stopped everything else as he thought those were physician instructions. He brought in multiple bottles of all his medicines of which some were expired a few months ago. He states that prior to his last visit, he was taking all his medicines regularly.  Plan: --had pharmacy dispose of expired medications and bottles --pharmacy met with patient with his consent to help set up pill box; he will f/u with them next week --listed out all medicines he should be taking with instructions --advised patient to contact us if there is ever confusion or question about his medicines in the future

## 2017-09-14 NOTE — Progress Notes (Signed)
   CC: medication assistance  HPI:  Juan [name] is a 66 y.o. with a PMH of HTN, T2DM, HLD, HTN, h/o CVA presenting to clinic for medication assistance.   Patient was seen by PCP recently and had med adjustment on his anti-hypertensives (stopped lisinopril, and started lisinopril-HCTZ). [facility] visit to his house noted multiple bottles and that patient was taking both lisinopril and lisinopril-HCTZ and had stopped all other medicines. Patient presents today with an interpreter for facilitation of interview and he brings all of his home medications.   Patient indicates that he has only been taking the lisinopril-HCTZ daily since his last visit and had stopped everything else as he thought those were physician instructions. He brought in multiple bottles of all his medicines of which some were expired a few months ago. He states that prior to his last visit, he was taking all his medicines regularly.   Today, he denies chest pain, shortness of breath, nausea, vomiting, blurry vision, headache, vision changes, polydipsia, polyuria, polyphagia, dizziness.   Please see problem based Assessment and Plan for status of patients chronic conditions.  Past Medical History:  Diagnosis Date  . Carotid artery occlusion   . Diabetes mellitus without complication (HCC)    Type II, takes metformin per patient(11/01/15)  . Hyperlipemia   . Hypertension   . Stroke [facility]) 06/2013   Left side- weak, unable to make a fist    Review of Systems:   ROS Per HPI  Physical Exam:  Vitals:   09/14/17 0957  BP: 133/64  Pulse: 65  Temp: 97.9 F (36.6 C)  TempSrc: Oral  SpO2: 100%  Weight: 154 lb 14.4 oz (70.3 kg)   GENERAL- alert, co-operative, appears as stated age, not in any distress. HEENT- Atraumatic, normocephalic CARDIAC- RRR, no murmurs, rubs or gallops. RESP- Moving equal volumes of air, and clear to auscultation bilaterally, no wheezes or crackles. ABDOMEN- Soft, nontender, bowel sounds  present. NEURO- No obvious Cr N abnormality. EXTREMITIES- pulse 2+, symmetric, no pedal edema. SKIN- Warm, dry, no rash or lesion. PSYCH- Normal mood and affect, appropriate thought content and speech.  Assessment & Plan:   See Encounters Tab for problem based charting.   Patient discussed with Dr. Cleda Daub   Nyra Market, MD Internal Medicine PGY2

## 2017-09-15 ENCOUNTER — Other Ambulatory Visit: Payer: Self-pay | Admitting: Pharmacist

## 2017-09-15 NOTE — Patient Outreach (Signed)
[facility]) Care Management  09/15/2017  Juan [name] November 17, 1951 [id number]  Third unsuccessful phone outreach attempt to patient---HIPAA compliant message left requesting return call.  Care coordination call placed to Amada Kingfisher, PharmD with [facility] Internal Medicine clinic.  Per PCP office note from 09/14/17, multiple medications were resumed for patient and pharmacy staff in clinic saw patient, filled pill box, and plans to follow patient.  PharmD in clinic confirms this and reports they will follow patient for medication adherence and re-refer patient to [facility] CM Pharmacist if needed.    Plan:  Will mail patient an outreach letter due to unable to reach patient.  If patient does not reply, will close case as patient may now be followed by PharmD in PCP office.   Tommye Standard, PharmD, [facility] Clinical Pharmacist [facility] [phone]

## 2017-09-15 NOTE — Progress Notes (Signed)
Internal Medicine Clinic Attending  Case discussed with Dr. Svalina  at the time of the visit.  We reviewed the resident's history and exam and pertinent patient test results.  I agree with the assessment, diagnosis, and plan of care documented in the resident's note.  

## 2017-09-21 ENCOUNTER — Ambulatory Visit: Payer: PPO | Admitting: Pharmacist

## 2017-09-30 ENCOUNTER — Other Ambulatory Visit: Payer: Self-pay | Admitting: Pharmacist

## 2017-09-30 NOTE — Patient Outreach (Signed)
[facility]) Care Management  09/30/2017  Juan [name] [birth year] [id number]  [facility] Pharmacist made 3 phone outreach attempts and outreach letter mailed to patient.  No reply from patient and 10 business days have passed.    Noted per phone call to PharmD in PCP office on 09/15/17, PharmD in clinic was planning to attempt to assist patient with medication adherence following [facility] home visit.    Plan:  Will close case due to inability to maintain contact with patient.    Will update PCP of case closure.   Tommye Standard, PharmD, [facility] Clinical Pharmacist [facility] [phone]

## 2017-11-12 ENCOUNTER — Encounter: Payer: PPO | Admitting: Internal Medicine

## 2018-04-13 ENCOUNTER — Telehealth: Payer: Self-pay | Admitting: *Deleted

## 2018-04-13 NOTE — Telephone Encounter (Signed)
Call to patient at both Mobile and Cell numbers to see how patient is doing and if he is taking his medication.  Patient is a part of the Geriatric task Force call list.  No answer.  Message was left on patient's voicemail to call the clinics  And to ask for [facility].  Angelina OkGladys Ashlynne Shetterly, RN  04/13/2018 3:01 PM

## 2018-06-10 ENCOUNTER — Encounter: Payer: PPO | Admitting: Internal Medicine

## 2018-07-01 ENCOUNTER — Other Ambulatory Visit: Payer: Self-pay

## 2018-07-01 ENCOUNTER — Ambulatory Visit (INDEPENDENT_AMBULATORY_CARE_PROVIDER_SITE_OTHER): Payer: PPO | Admitting: Internal Medicine

## 2018-07-01 VITALS — BP 173/94 | HR 77 | Temp 98.4°F | Wt 156.0 lb

## 2018-07-01 DIAGNOSIS — R809 Proteinuria, unspecified: Secondary | ICD-10-CM | POA: Diagnosis not present

## 2018-07-01 DIAGNOSIS — E1169 Type 2 diabetes mellitus with other specified complication: Secondary | ICD-10-CM | POA: Diagnosis not present

## 2018-07-01 DIAGNOSIS — Z79899 Other long term (current) drug therapy: Secondary | ICD-10-CM | POA: Diagnosis not present

## 2018-07-01 DIAGNOSIS — Z7984 Long term (current) use of oral hypoglycemic drugs: Secondary | ICD-10-CM

## 2018-07-01 DIAGNOSIS — I10 Essential (primary) hypertension: Secondary | ICD-10-CM

## 2018-07-01 DIAGNOSIS — E1151 Type 2 diabetes mellitus with diabetic peripheral angiopathy without gangrene: Secondary | ICD-10-CM | POA: Diagnosis not present

## 2018-07-01 LAB — POCT GLYCOSYLATED HEMOGLOBIN (HGB A1C): Hemoglobin A1C: 10.3 % — AB (ref 4.0–5.6)

## 2018-07-01 LAB — GLUCOSE, CAPILLARY: Glucose-Capillary: 225 mg/dL — ABNORMAL HIGH (ref 70–99)

## 2018-07-01 MED ORDER — AMLODIPINE BESYLATE 10 MG PO TABS
10.0000 mg | ORAL_TABLET | Freq: Every day | ORAL | 1 refills | Status: DC
Start: 2018-07-01 — End: 2018-10-07

## 2018-07-01 NOTE — Patient Instructions (Addendum)
Mr. Juan [name],   Your blood pressure was very high during today's visit.  Because of this we added a new medication for your blood pressure called amlodipine.  You will take 1 tablet (10 mg daily).  Please schedule a follow-up appointment with me in 4 weeks to check on your blood pressure again.  Continue taking your diabetes medications as usual.  We checked blood work today for your diabetes.  I will give you a call if the results are not normal.  If you do not receive a call that means results are normal.  Please call if you have any questions or concerns.  - Dr. Evelene CroonSantos

## 2018-07-02 LAB — MICROALBUMIN / CREATININE URINE RATIO
Creatinine, Urine: 113.4 mg/dL
Microalb/Creat Ratio: 1194.1 mg/g creat — ABNORMAL HIGH (ref 0.0–30.0)
Microalbumin, Urine: 1354.1 ug/mL

## 2018-07-02 LAB — BMP8+ANION GAP
Anion Gap: 14 mmol/L (ref 10.0–18.0)
BUN/Creatinine Ratio: 14 (ref 10–24)
BUN: 19 mg/dL (ref 8–27)
CO2: 26 mmol/L (ref 20–29)
Calcium: 9.5 mg/dL (ref 8.6–10.2)
Chloride: 96 mmol/L (ref 96–106)
Creatinine, Ser: 1.36 mg/dL — ABNORMAL HIGH (ref 0.76–1.27)
GFR calc Af Amer: 61 mL/min/{1.73_m2} (ref >59)
GFR calc non Af Amer: 53 mL/min/{1.73_m2} — ABNORMAL LOW (ref >59)
Glucose: 217 mg/dL — ABNORMAL HIGH (ref 65–99)
Potassium: 4.4 mmol/L (ref 3.5–5.2)
Sodium: 136 mmol/L (ref 134–144)

## 2018-07-02 NOTE — Progress Notes (Signed)
CC: T2DM and HTN follow up   HPI:  [name] is a 67 y.o. male with PMH listed below who presents to clinic for T2DM and HTN follow up. His native language is [place] and an interpreter   T2DM, uncontrolled: Patient presents for T2DM follow up. He is currently on metformin 1000 mg BID and glipizide 5 mg BID and reports compliance with medications. He is able to name the medications and specify the dosages. He also reports adhering to a low carbohydrate diet. His previous A1c was 7.2 in 08/2017. Recommended continuing current medications during visit. However, repeat A1c today 10.3. I am not sure what has caused an increase in his A1c as he reports compliance with his medications and has been well controlled on them for the past year. His CBG was 225 and weight has been stable between 155-157 lbs for the past 3 years. He states he has support at home and some one who helps with medication. I worry he is not taking his medications correctly though as this has been an issue in the recent past. [facility]. Will not start insulin at this time as I am not sure if patient will be able to administer this correctly by himself. Will add an SGLT2 inhibitor to his regimen and have him follow up in 3 weeks. His renal function has worsened but GFR > 45. He does not have a history of PVD or diabetic ulcers.  - Continue metformin and atorvastatin  - Would like to start Invokana as well at 100 mg QD. Will try to combine this with metformin for better compliance. Appreciate Dr. Elmyra RicksKim's help with medication management for this patient.  - BMP and urine microalbumin  - Follow up in 3 months or sooner if needed  - [facility] for medication management in the setting of uncontrolled T2DM   Cr 1.3 (normal renal function 07/2017) and worsening microalbuminuria. Changes in medications as above. I have attempted to call patient numerous times to go over results and changes  in medications but no answer. Will continue to call. I have also [facility] a [facility] as above.    HTN, uncontrolled: Patient has a history of HTN that has been well controlled on lisinopril-HCTZ 40-25 mg QD. He reports compliance with his medications and is able to name them as well as the dosages. However, his BP is 190/72 and 173/94 when repeated. Will add amlodipine to his regimen and order BMP to check renal function.  - Start amlodipine 10 mg QD  - BMP to check renal function    Past Medical History:  Diagnosis Date  . Carotid artery occlusion   . Diabetes mellitus without complication (HCC)    Type II, takes metformin per patient(11/01/15)  . Hyperlipemia   . Hypertension   . Stroke [facility](HCC) 06/2013   Left side- weak, unable to make a fist   Review of Systems:   Review of Systems  Constitutional: Negative for chills, fever and malaise/fatigue.  Respiratory: Negative for cough and shortness of breath.   Cardiovascular: Negative for chest pain, palpitations and leg swelling.  Gastrointestinal: Negative for abdominal pain, constipation, diarrhea, nausea and vomiting.  Genitourinary: Negative for frequency and urgency.  Neurological: Negative for dizziness, tingling, focal weakness, weakness and headaches.    Physical Exam:  Vitals:   07/01/18 1431 07/01/18 1445  BP: (!) 190/72 (!) 173/94  Pulse: 82 77  Temp: 98.4 F (36.9 C)   TempSrc:  Oral   SpO2: 98%   Weight: 156 lb (70.8 kg)    Physical Exam  Constitutional: He is oriented to person, place, and time and well-developed, well-nourished, and in no distress.  Cardiovascular: Normal rate, regular rhythm and normal heart sounds. Exam reveals no gallop and no friction rub.  No murmur heard. Pulmonary/Chest: Effort normal and breath sounds normal. No respiratory distress. He has no wheezes. He has no rales.  Abdominal: Soft. Bowel sounds are normal. He exhibits no distension. There is no tenderness.  Musculoskeletal: He  exhibits no edema.  Neurological: He is alert and oriented to person, place, and time.    Assessment & Plan:   See Encounters Tab for problem based charting.n   Patient discussed with Dr. Cyndie Chime

## 2018-07-04 ENCOUNTER — Other Ambulatory Visit: Payer: Self-pay

## 2018-07-04 ENCOUNTER — Other Ambulatory Visit: Payer: Self-pay | Admitting: Pharmacist

## 2018-07-04 ENCOUNTER — Encounter: Payer: Self-pay | Admitting: Internal Medicine

## 2018-07-04 ENCOUNTER — Telehealth: Payer: Self-pay | Admitting: Pharmacist

## 2018-07-04 DIAGNOSIS — E1151 Type 2 diabetes mellitus with diabetic peripheral angiopathy without gangrene: Secondary | ICD-10-CM

## 2018-07-04 MED ORDER — CANAGLIFLOZIN-METFORMIN HCL ER 50-1000 MG PO TB24: 2.0000 | ORAL_TABLET | Freq: Every day | ORAL | 3 refills | Status: DC

## 2018-07-04 NOTE — Assessment & Plan Note (Addendum)
HTN, uncontrolled: Patient has a history of HTN that has been well controlled on lisinopril-HCTZ 40-25 mg QD. He reports compliance with his medications and is able to name them as well as the dosages. However, his BP is 190/72 and 173/94 when repeated. Will add amlodipine to his regimen and order BMP to check renal function.  - Start amlodipine 10 mg QD  - BMP to check renal function - Follow up in 4 weeks   Renal function worsening. See A&P for T2DM for further details on management.

## 2018-07-04 NOTE — Assessment & Plan Note (Signed)
I have made a referral to teaching for medication management in the setting of new uncontrolled type 2 diabetes and hypertension.  Patient has had issues in the past with medication management and I worry he is not taking his medications correctly.  [facility]- [facility] referral. See A&P for T2DM for further details

## 2018-07-04 NOTE — Progress Notes (Signed)
Initiated Invokamet per Dr. Mordecai MaesSanchez approval, contacted patient to provide education, unable to reach.

## 2018-07-04 NOTE — Patient Outreach (Signed)
[facility]) Care Management  07/04/2018  Dwain SarnaYNguan Kuroda 11/03/1951 [id number]   Telephone Screen  Referral Date: 07/04/18 Referral Source: MD office Referral Reason: " medication mgmt in the setting of uncontrolleld diabetes, HTN and worsening kidney function" Insurance: HTA   Outreach attempt # 1 to patient. No answer at present. RN CM left HIPAA compliant voicemail message along with contact info.         Plan: RN CM will make outreach attempt to patient within 3-4 business days. RN CM will send unsuccessful outreach letter to patient.    Antionette Fairyoshanda Osinachi Navarrette, RN,BSN,CCM [facility] Telephonic Care Management Coordinator Direct Phone: [phone] Toll Free: [phone] Fax: [phone]

## 2018-07-04 NOTE — Progress Notes (Signed)
Medicine attending: Medical history, presenting problems, physical findings, and medications, reviewed with resident physician Dr Santos-Sanchez on the day of the patient visit and I concur with her evaluation and management plan. 

## 2018-07-04 NOTE — Assessment & Plan Note (Addendum)
T2DM, uncontrolled: Patient presents for T2DM follow up. He is currently on metformin 1000 mg BID and glipizide 5 mg BID and reports compliance with medications. He is able to name the medications and specify the dosages. He also reports adhering to a low carbohydrate diet. His previous A1c was 7.2 in 08/2017. Recommended continuing current medications during visit. However, repeat A1c today 10.3. I am not sure what has caused an increase in his A1c as he reports compliance with his medications and has been well controlled on them for the past year. His CBG was 225 and weight has been stable between 155-157 lbs for the past 3 years. He states he has support at home and some one who helps with medication. I worry he is not taking his medications correctly though as this has been an issue in the recent past. [facility]. Will not start insulin at this time as I am not sure if patient will be able to administer this correctly by himself. Will add an SGLT2 inhibitor to his regimen and have him follow up in 3 weeks. His renal function has worsened but GFR > 45. He does not have a history of PVD or diabetic ulcers.  - Continue metformin and atorvastatin  - Would like to start Invokana as well at 100 mg QD. Will try to combine this with metformin for better compliance. Appreciate Dr. Elmyra RicksKim's help with medication management for this patient.  - BMP and urine microalbumin  - Follow up in 3 months or sooner if needed  - [facility] in the setting of uncontrolled T2DM   Cr 1.3 (normal renal function 07/2017) and worsening microalbuminuria. Changes in medications as above. I have attempted to call patient numerous times to go over results and changes in medications but no answer. Will continue to call. I [facility] also [facility] as above.

## 2018-07-06 ENCOUNTER — Other Ambulatory Visit: Payer: Self-pay

## 2018-07-06 NOTE — Patient Outreach (Signed)
[facility]) Care Management  07/06/2018  Juan SarnaYNguan Nave 08/11/1951 [id number]   Telephone Screen  Referral Date: 07/04/18 Referral Source: MD office Referral Reason: " medication mgmt in the setting of uncontrolleld diabetes, HTN and worsening kidney function" Insurance: HTA    Outreach attempt #2 to patient. Patient answered the phone. Patient speaks very limited [place]. RN CM contacted [employer]. Spoke with multiple interpretersSaverio Danker( Maria,Elva, Tu, and ChamberlainRoberto -[place] # [id number])trying to determine correct language for patient. Patient able to voice he is "from [place] but does not speak [place], speaks "Rade'. All interpreters for that language currently busy and not available. Then RN CM placed on hold. RN CM told that that "is a tribal dialect and there are no interpreters for that dialect."     Plan: RN CM will attempt outreach to patient within 3-4 business days.    Antionette Fairyoshanda Angi Goodell, RN,BSN,CCM [facility] Care Management Telephonic Care Management Coordinator Direct Phone: [phone] Toll Free: [phone] Fax: [phone]

## 2018-07-07 ENCOUNTER — Other Ambulatory Visit: Payer: Self-pay

## 2018-07-07 NOTE — Patient Outreach (Addendum)
[facility]) Care Management  07/07/2018  Juan [name] 01/15/1951 [id number]   Telephone Screen  Referral Date:07/04/18 Referral Source:MD office Referral Reason:" medication mgmt in the setting of uncontrolled diabetes, HTN and worsening kidney function" Insurance:HTA    Outreach attempt #3 to patient. Patient answered the phone and handed the phone to his son. Patient gave to give verbal consent for RN CM to speak with son who speaks [place]. RN CM discussed referral with son and completed screening call.   Social: Patient resides in the home along with his son. Son states that patient is independent with ADLs/IADLs. He denies any recent falls. Son takes patient to all MD appts as patient does not drive.    Conditions: Per chart review, patient has PMH of DM, PVD, carotid artery occlusion, HLD, HTN and stroke. Per records, last A1C was 7.2. RN CM inquired with son about patent monitoring blood sugars he voiced he "didn't know." Son did not wish to discuss patient's medical conditions any further.    Medications: Son stated "he is only taking two meds that I know about." He denies any issues affording and/or managing meds.    Consent: [facility] reviewed and discussed with son. Son declined all services stating that they were okay and did not need them. RN CM advised son that MD is encouraging and requesting [facility] involvement to better assist patient with his medical conditions but son again politely declined. RN CM has [facility] info and patient/family may contact [facility] at any time in the future if they change their minds.    Plan: RN CM will close case at this time. RN CM will send MD case closure letter.  Antionette Fairyoshanda Keion Neels, RN,BSN,CCM [facility] Care Management Telephonic Care Management Coordinator Direct Phone: [phone] Toll Free: [phone] Fax: [phone]

## 2018-07-18 ENCOUNTER — Telehealth: Payer: Self-pay | Admitting: Pharmacist

## 2018-07-18 NOTE — Progress Notes (Signed)
Spoke to patient to offer help with medications. Patient states he has no concerns at this time, is able to afford his medications, understands how to take, and reports no side effects. Advised patient to contact clinic if questions/concerns arise, patient verbalized understanding.

## 2018-07-18 NOTE — Telephone Encounter (Signed)
I think the co-visit ideas sound wonderful! Thank you so much for your help!

## 2018-08-03 ENCOUNTER — Ambulatory Visit (INDEPENDENT_AMBULATORY_CARE_PROVIDER_SITE_OTHER): Payer: PPO | Admitting: Internal Medicine

## 2018-08-03 ENCOUNTER — Ambulatory Visit: Payer: PPO | Admitting: Pharmacist

## 2018-08-03 ENCOUNTER — Encounter: Payer: Self-pay | Admitting: Internal Medicine

## 2018-08-03 ENCOUNTER — Other Ambulatory Visit: Payer: Self-pay

## 2018-08-03 VITALS — BP 169/88 | HR 72 | Temp 98.2°F | Ht 65.0 in | Wt 155.1 lb

## 2018-08-03 DIAGNOSIS — Z7982 Long term (current) use of aspirin: Secondary | ICD-10-CM

## 2018-08-03 DIAGNOSIS — E1151 Type 2 diabetes mellitus with diabetic peripheral angiopathy without gangrene: Secondary | ICD-10-CM | POA: Diagnosis not present

## 2018-08-03 DIAGNOSIS — Z7984 Long term (current) use of oral hypoglycemic drugs: Secondary | ICD-10-CM

## 2018-08-03 DIAGNOSIS — I2510 Atherosclerotic heart disease of native coronary artery without angina pectoris: Secondary | ICD-10-CM

## 2018-08-03 DIAGNOSIS — I10 Essential (primary) hypertension: Secondary | ICD-10-CM

## 2018-08-03 DIAGNOSIS — Z79899 Other long term (current) drug therapy: Secondary | ICD-10-CM

## 2018-08-03 LAB — GLUCOSE, CAPILLARY: Glucose-Capillary: 164 mg/dL — ABNORMAL HIGH (ref 70–99)

## 2018-08-03 NOTE — Assessment & Plan Note (Signed)
The patient's blood pressure during this visit was 169/88 and on repeat measurement it was with systolic in the 180s.  The patient is currently taking lisinopril-hydrochlorothiazide 20-12.5 mg twice daily and amlodipine 10 mg daily.  Assessment and plan The patient has resistant hypertension.  He is no longer smoking.  The most likely reason for his resistant hypertension is noncompliance to medication.  Review of the patient's medication to suspension from his pharmacy showed that he has not picked up lisinopril-hydrochlorothiazide.  Therefore, we speculate that the patient has not been taking his prescribed medication.    Due to language barrier and concern about the patient not taking his medication we will arrange for him to get a pill pack.  -Continue amlodipine 10 mg daily -Continue lisinopril-hydrochlorothiazide 20-12.5 mg 2 tablets daily -Contacted Dr. Selena BattenKim to assist with arranging pill pack for the patient -Follow-up in 1 month

## 2018-08-03 NOTE — Progress Notes (Signed)
   CC: Diabetes and hypertension follow-up  HPI:  Juan [name] is a 67 y.o. male with diabetes mellitus type 2, coronary artery stenosis, hypertension who presents for diabetes and hypertension follow-up. Please see problem based charting for evaluation, assessment, and plan.  Past Medical History:  Diagnosis Date  . Carotid artery occlusion   . Diabetes mellitus without complication (HCC)    Type II, takes metformin per patient(11/01/15)  . Hyperlipemia   . Hypertension   . Stroke [facility](HCC) 06/2013   Left side- weak, unable to make a fist   Review of Systems:   Denies shortness of breath, chest pain, dizziness  Physical Exam:  Vitals:   08/03/18 1447  BP: (!) 169/88  Pulse: 72  Temp: 98.2 F (36.8 C)  TempSrc: Oral  SpO2: 98%  Weight: 155 lb 1.6 oz (70.4 kg)  Height: 5\' 5"  (1.651 m)   Physical Exam  Constitutional: He appears well-developed and well-nourished. No distress.  HENT:  Head: Normocephalic and atraumatic.  Eyes: Conjunctivae are normal.  Cardiovascular: Normal rate, regular rhythm and normal heart sounds.  Pulmonary/Chest: Effort normal and breath sounds normal. No stridor. No respiratory distress.  Abdominal: Soft. Bowel sounds are normal. He exhibits no distension. There is no tenderness.  Musculoskeletal: He exhibits no edema.  Neurological: He is alert.  Skin: He is not diaphoretic.  Psychiatric: He has a normal mood and affect. His behavior is normal. Judgment and thought content normal.     Assessment & Plan:   See Encounters Tab for problem based charting.   Diabetes mellitus type II The patient had a A1c done in July 2019 which was 10.3.  He is currently taking canagliflozin-metformin 50-1000mg  twice daily that was started 1 month ago.  His random blood glucose level during the visit was 225.  The patient is also on Lipitor 80 mg daily and aspirin 81 mg  Assessment and plan Since the patient's diabetes regimen has recently been changed after  elevated A1c was noted we will continue patient on current regimen.    -Continue Invokamet 50-1000mg  2 tablets daily -Follow-up in 2 months  Hypertension The patient's blood pressure during this visit was 169/88 and on repeat measurement it was with systolic in the 180s.  The patient is currently taking lisinopril-hydrochlorothiazide 20-12.5 mg twice daily and amlodipine 10 mg daily.  Assessment and plan The patient has resistant hypertension.  He is no longer smoking.  The most likely reason for his resistant hypertension is noncompliance to medication.  Review of the patient's medication to suspension from his pharmacy showed that he has not picked up lisinopril-hydrochlorothiazide.  Therefore, we speculate that the patient has not been taking his prescribed medication.    Due to language barrier and concern about the patient not taking his medication we will arrange for him to get a pill pack.  -Continue amlodipine 10 mg daily -Continue lisinopril-hydrochlorothiazide 20-12.5 mg 2 tablets daily -Contacted Dr. Selena BattenKim to assist with arranging pill pack for the patient -Follow-up in 1 month   Patient discussed with Dr. Oswaldo DoneVincent

## 2018-08-03 NOTE — Assessment & Plan Note (Signed)
The patient had a A1c done in July 2019 which was 10.3.  He is currently taking canagliflozin-metformin 50-1000mg  twice daily that was started 1 month ago.  His random blood glucose level during the visit was 225.  The patient is also on Lipitor 80 mg daily and aspirin 81 mg  Assessment and plan Since the patient's diabetes regimen has recently been changed after elevated A1c was noted we will continue patient on current regimen.    -Continue Invokamet 50-1000mg  2 tablets daily -Follow-up in 2 months

## 2018-08-03 NOTE — Patient Instructions (Signed)
It was a pleasure to see you today Mr. Juan [name]. Please continue taking all of your medication. We will send you your medication as a pill pack.   If you have any questions or concerns, please call our clinic at [phone] between 9am-5pm and after hours call [phone] and ask for the internal medicine resident on call. If you feel you are having a medical emergency please call 911.   Thank you, we look forward to help you remain healthy!  Lorenso CourierVahini Andreanna Mikolajczak, MD Internal Medicine PGY2

## 2018-08-04 NOTE — Progress Notes (Signed)
Internal Medicine Clinic Attending  Case discussed with Dr. Delma Officerhundi  at the time of the visit.  We reviewed the resident's history and exam and pertinent patient test results.  I agree with the assessment, diagnosis, and plan of care documented in the resident's note.  Uncontrolled hypertension with high risk for medication non-compliance. Epic dispense report shows no lisinopri-hctz filled in the last 3 months, just amlodipine. I agree with working on this before we consider adding a fourth agent.

## 2018-09-16 ENCOUNTER — Telehealth: Payer: Self-pay | Admitting: Pharmacist

## 2018-09-16 NOTE — Progress Notes (Signed)
Offered patient a delivery pharmacy, but patient declines at this time.

## 2018-10-07 ENCOUNTER — Encounter: Payer: Self-pay | Admitting: Internal Medicine

## 2018-10-07 ENCOUNTER — Ambulatory Visit (INDEPENDENT_AMBULATORY_CARE_PROVIDER_SITE_OTHER): Payer: PPO | Admitting: Internal Medicine

## 2018-10-07 ENCOUNTER — Other Ambulatory Visit: Payer: Self-pay

## 2018-10-07 VITALS — BP 168/79 | HR 86 | Temp 98.7°F | Ht 65.0 in | Wt 155.4 lb

## 2018-10-07 DIAGNOSIS — E1169 Type 2 diabetes mellitus with other specified complication: Secondary | ICD-10-CM

## 2018-10-07 DIAGNOSIS — Z9114 Patient's other noncompliance with medication regimen: Secondary | ICD-10-CM | POA: Diagnosis not present

## 2018-10-07 DIAGNOSIS — E785 Hyperlipidemia, unspecified: Secondary | ICD-10-CM | POA: Diagnosis not present

## 2018-10-07 DIAGNOSIS — Z79899 Other long term (current) drug therapy: Secondary | ICD-10-CM

## 2018-10-07 DIAGNOSIS — E1151 Type 2 diabetes mellitus with diabetic peripheral angiopathy without gangrene: Secondary | ICD-10-CM | POA: Diagnosis not present

## 2018-10-07 DIAGNOSIS — Z8673 Personal history of transient ischemic attack (TIA), and cerebral infarction without residual deficits: Secondary | ICD-10-CM | POA: Diagnosis not present

## 2018-10-07 DIAGNOSIS — Z23 Encounter for immunization: Secondary | ICD-10-CM | POA: Diagnosis not present

## 2018-10-07 DIAGNOSIS — I10 Essential (primary) hypertension: Secondary | ICD-10-CM | POA: Diagnosis not present

## 2018-10-07 DIAGNOSIS — Z7982 Long term (current) use of aspirin: Secondary | ICD-10-CM | POA: Diagnosis not present

## 2018-10-07 DIAGNOSIS — Z7984 Long term (current) use of oral hypoglycemic drugs: Secondary | ICD-10-CM

## 2018-10-07 LAB — POCT GLYCOSYLATED HEMOGLOBIN (HGB A1C): Hemoglobin A1C: 8.9 % — AB (ref 4.0–5.6)

## 2018-10-07 LAB — GLUCOSE, CAPILLARY: GLUCOSE-CAPILLARY: 305 mg/dL — AB (ref 70–99)

## 2018-10-07 MED ORDER — LISINOPRIL-HYDROCHLOROTHIAZIDE 20-12.5 MG PO TABS: 2.0000 | ORAL_TABLET | Freq: Every day | ORAL | 3 refills | Status: DC

## 2018-10-07 MED ORDER — ATORVASTATIN CALCIUM 80 MG PO TABS: 80.0000 mg | ORAL_TABLET | Freq: Every day | ORAL | 3 refills | Status: DC

## 2018-10-07 MED ORDER — ASPIRIN 81 MG PO TBEC: 81.0000 mg | DELAYED_RELEASE_TABLET | Freq: Every day | ORAL | 3 refills | Status: DC

## 2018-10-07 MED ORDER — CANAGLIFLOZIN-METFORMIN HCL ER 50-1000 MG PO TB24: 2.0000 | ORAL_TABLET | Freq: Every day | ORAL | 3 refills | Status: DC

## 2018-10-07 NOTE — Patient Instructions (Addendum)
Mr. Stamour,   Please throw away all of your old medications that you have at home.  It is not safe today to take old medications.  This can have serious side effects.  You  will only take the following medications:   Lisinopril- HCTZ 20-12.5 mg. Take 2 tablets once a day.  Invokamet 50-1000 mg. Take 1 tablet 2 times a day.  Aspirin 81 mg, 1 tablet once a day  Atorvastatin 80 mg, 1 tablet every day   You received your flu shot today.   Someone from the office will call you 2 weeks to make sure you are not having any issues taking or affording the medications.  He also have an appointment with her pharmacist on Monday 10/14.  This is very important that you come to this appointment and bring all of your home medications as well as your blood your meter so that we can teach you how to use it.  Please follow-up with me in 3 months or sooner if you need to.  In the meantime please call us if you have any questions or concerns.  - Dr. Evelene Croon

## 2018-10-09 NOTE — Progress Notes (Signed)
   CC: Follow up of T2DM and HTN   HPI:  [name] is a 67 y.o. year-old male with PMH listed below who presents to clinic for follow up of T2DM and HTN. Please see problem based assessment and plan for further details.   Past Medical History:  Diagnosis Date  . Carotid artery occlusion   . Diabetes mellitus without complication (HCC)    Type II, takes metformin per patient(11/01/15)  . Hyperlipemia   . Hypertension   . Stroke [facility]) 06/2013   Left side- weak, unable to make a fist   Review of Systems:   Review of Systems  Constitutional: Negative for chills, fever and malaise/fatigue.  Respiratory: Negative for shortness of breath.   Cardiovascular: Negative for chest pain and leg swelling.  Gastrointestinal: Negative for abdominal pain, constipation, diarrhea, nausea and vomiting.  Genitourinary: Negative for frequency and urgency.  Neurological: Negative for dizziness and headaches.    Physical Exam: Vitals:   10/07/18 1441  BP: (!) 168/79  Pulse: 86  Temp: 98.7 F (37.1 C)  TempSrc: Oral  SpO2: 98%  Weight: 155 lb 6.4 oz (70.5 kg)  Height: 5\' 5"  (1.651 m)   General: well-appearing, elderly male in no acute distress  Cardiac: regular rate and rhythm, nl S1/S2, no murmurs, rubs or gallops  Pulm: CTAB, no wheezes or crackles, no increased work of breathing on room air  Abd: soft, NTND, bowel sounds normoactive  Ext: warm and well perfused, no peripheral edema   Assessment & Plan:   See Encounters Tab for problem based charting.  Patient discussed with Dr. Oswaldo Done

## 2018-10-10 ENCOUNTER — Ambulatory Visit (INDEPENDENT_AMBULATORY_CARE_PROVIDER_SITE_OTHER): Payer: PPO | Admitting: Pharmacist

## 2018-10-10 ENCOUNTER — Encounter: Payer: Self-pay | Admitting: Internal Medicine

## 2018-10-10 DIAGNOSIS — Z79899 Other long term (current) drug therapy: Secondary | ICD-10-CM

## 2018-10-10 DIAGNOSIS — Z7984 Long term (current) use of oral hypoglycemic drugs: Secondary | ICD-10-CM

## 2018-10-10 DIAGNOSIS — E1151 Type 2 diabetes mellitus with diabetic peripheral angiopathy without gangrene: Secondary | ICD-10-CM | POA: Diagnosis not present

## 2018-10-10 DIAGNOSIS — I10 Essential (primary) hypertension: Secondary | ICD-10-CM

## 2018-10-10 NOTE — Assessment & Plan Note (Signed)
Mr. Bencomo presents for T2DM follow up. He is on Invokamet 50-1000 mg BID but ran out of medications 3 weeks ago. His diabetes has been uncontrolled for 6 months now and I suspect this is secondary to medication non-adherence. Called patient's pharmacy who confirmed patient has not picked up any prescriptions since 2018. When I discussed this with him, he stated he has been taking old medicine he has home.He denies issues affording medications and has declined [facility] services as well as Herbalist multiple times. His A1c today is 8.4. Will continue to follow up closely.    - Continue current regimen, asked patient to throw away all of his old medication and take only what I refilled today  - Appt on Monday with Dr. Selena Batten for medication management, ensure patient is taking medication appropriately, and education on how to use BG meter  - Will call in 2 weeks to ensure patient has been taking medications  - Follow up in 3 months

## 2018-10-10 NOTE — Addendum Note (Signed)
Addended by: Erlinda Hong T on: 10/10/2018 02:14 PM   Modules accepted: Level of Service

## 2018-10-10 NOTE — Assessment & Plan Note (Signed)
Refilled atorvastatin 80 mg QD.

## 2018-10-10 NOTE — Progress Notes (Signed)
Internal Medicine Clinic Attending  Case discussed with Dr. Santos-Sanchez at the time of the visit.  We reviewed the resident's history and exam and pertinent patient test results.  I agree with the assessment, diagnosis, and plan of care documented in the resident's note.    

## 2018-10-10 NOTE — Assessment & Plan Note (Signed)
Juan [name] presents for HTN follow up. He is on lisinopril-HCTZ 20-12.5 mg BID, but is not compliant with medication at home and BP has been uncontrolled for the past 6 months. BP today is 168/79. Will not add an anti-hypertensive at this time as I suspect this is all secondary to non-compliance .   - Continue current regimen - We will continue to work on compliance and education of long term effects of uncontrolled HTN as he already has a history of stroke

## 2018-10-13 NOTE — Progress Notes (Signed)
S: Juan [name] is a 67 y.o. male reports to clinical pharmacist appointment for medication education. Patient did bring medication bottles.   No Known Allergies Medication Sig  aspirin 81 MG EC tablet Take 1 tablet (81 mg total) by mouth daily.  atorvastatin (LIPITOR) 80 MG tablet Take 1 tablet (80 mg total) by mouth daily.  Canagliflozin-metFORMIN HCl ER (INVOKAMET XR) 50-1000 MG TB24 Take 2 tablets by mouth daily with breakfast.  lisinopril-hydrochlorothiazide (ZESTORETIC) 20-12.5 MG tablet Take 2 tablets by mouth daily.   Past Medical History:  Diagnosis Date  . Carotid artery occlusion   . Diabetes mellitus without complication (HCC)    Type II, takes metformin per patient(11/01/15)  . Hyperlipemia   . Hypertension   . Stroke [facility]) 06/2013   Left side- weak, unable to make a fist   Social History   Socioeconomic History  . Marital status: Married    Spouse name: Not on file  . Number of children: Not on file  . Years of education: 0  . Highest education level: Not on file  Occupational History  . Not on file  Social Needs  . Financial resource strain: Not on file  . Food insecurity:    Worry: Not on file    Inability: Not on file  . Transportation needs:    Medical: Not on file    Non-medical: Not on file  Tobacco Use  . Smoking status: Former Smoker    Years: 50.00    Last attempt to quit: 12/28/2005    Years since quitting: 12.8  . Smokeless tobacco: Former Engineer, water and Sexual Activity  . Alcohol use: No    Alcohol/week: 0.0 standard drinks    Comment: a few drinks occasionally   . Drug use: No  . Sexual activity: Not on file  Lifestyle  . Physical activity:    Days per week: Not on file    Minutes per session: Not on file  . Stress: Not on file  Relationships  . Social connections:    Talks on phone: Not on file    Gets together: Not on file    Attends religious service: Not on file    Active member of club or organization: Not on file    Attends  meetings of clubs or organizations: Not on file    Relationship status: Not on file  Other Topics Concern  . Not on file  Social History Narrative   Lives at home with wife, son, and grandchildren.   No family history on file.  O:    Component Value Date/Time   CHOL 207 (H) 08/09/2017 1439   HDL 31 (L) 08/09/2017 1439   TRIG 316 (H) 08/09/2017 1439   AST 20 10/18/2015 1107   ALT 22 10/18/2015 1107   NA 136 07/01/2018 1505   K 4.4 07/01/2018 1505   CL 96 07/01/2018 1505   CO2 26 07/01/2018 1505   GLUCOSE 217 (H) 07/01/2018 1505   GLUCOSE 131 (H) 10/18/2015 1107   HGBA1C 8.9 (A) 10/07/2018 1447   HGBA1C 8.1 (H) 10/18/2015 1106   BUN 19 07/01/2018 1505   CREATININE 1.36 (H) 07/01/2018 1505   CREATININE 1.04 01/28/2015 1533   CALCIUM 9.5 07/01/2018 1505   GFRNONAA 53 (L) 07/01/2018 1505   GFRNONAA 76 01/28/2015 1533   GFRAA 61 07/01/2018 1505   GFRAA 87 01/28/2015 1533   WBC 11.1 (H) 10/18/2015 1107   HGB 13.4 10/18/2015 1107   HGB 14.8 08/12/2015 1550  HCT 41.4 10/18/2015 1107   HCT 44.9 08/12/2015 1550   PLT 228 10/18/2015 1107   PLT 228 08/12/2015 1550   Ht Readings from Last 2 Encounters:  10/07/18 5\' 5"  (1.651 m)  08/03/18 5\' 5"  (1.651 m)   Wt Readings from Last 2 Encounters:  10/07/18 155 lb 6.4 oz (70.5 kg)  08/03/18 155 lb 1.6 oz (70.4 kg)   There is no height or weight on file to calculate BMI. BP Readings from Last 3 Encounters:  10/07/18 (!) 168/79  08/03/18 (!) 169/88  07/01/18 (!) 173/94   A/P: Worked with [facility] for Rhade interpretation with patient.  Medications were reviewed with the patient, including name, instructions, indication, goals of therapy, potential side effects, importance of adherence, and safe use. We noted that patient is not currently taking canagliflozin-metformin, patient was advised to pick up from pharmacy and initiate therapy.  Patient was also educated about self monitoring of BG, demonstrated correct meter use, and was  advised to check and record BG 3 times daily. We also reviewed BG goals and importance of management.  Patient verbalized understanding by repeating back information and was advised to contact me if medication-related questions arise. Patient was also provided an information handout.  An after visit summary was provided and patient advised to follow up if any changes in condition or questions regarding medications arise.   30 minutes spent face-to-face with the patient during the encounter. 85% of time spent on education. 15% of time was spent on assessment, plan, and coordination of care.

## 2018-10-13 NOTE — Patient Instructions (Signed)
Please pick up canagliflozin-metformin from the pharmacy and start taking.  Contact clinic if any questions or concerns, and schedule appointment for follow up.

## 2019-01-06 ENCOUNTER — Ambulatory Visit: Payer: PPO | Admitting: Dietician

## 2019-01-06 ENCOUNTER — Other Ambulatory Visit: Payer: Self-pay | Admitting: Dietician

## 2019-01-06 ENCOUNTER — Encounter: Payer: Self-pay | Admitting: Dietician

## 2019-01-06 ENCOUNTER — Ambulatory Visit (INDEPENDENT_AMBULATORY_CARE_PROVIDER_SITE_OTHER): Payer: PPO | Admitting: Internal Medicine

## 2019-01-06 ENCOUNTER — Encounter: Payer: Self-pay | Admitting: Internal Medicine

## 2019-01-06 ENCOUNTER — Other Ambulatory Visit: Payer: Self-pay

## 2019-01-06 VITALS — BP 108/65 | HR 88 | Temp 97.4°F | Wt 148.0 lb

## 2019-01-06 DIAGNOSIS — E1151 Type 2 diabetes mellitus with diabetic peripheral angiopathy without gangrene: Secondary | ICD-10-CM

## 2019-01-06 DIAGNOSIS — E785 Hyperlipidemia, unspecified: Secondary | ICD-10-CM | POA: Diagnosis not present

## 2019-01-06 DIAGNOSIS — Z8673 Personal history of transient ischemic attack (TIA), and cerebral infarction without residual deficits: Secondary | ICD-10-CM

## 2019-01-06 DIAGNOSIS — E1169 Type 2 diabetes mellitus with other specified complication: Secondary | ICD-10-CM | POA: Diagnosis not present

## 2019-01-06 DIAGNOSIS — I10 Essential (primary) hypertension: Secondary | ICD-10-CM

## 2019-01-06 DIAGNOSIS — Z79899 Other long term (current) drug therapy: Secondary | ICD-10-CM | POA: Diagnosis not present

## 2019-01-06 DIAGNOSIS — Z7984 Long term (current) use of oral hypoglycemic drugs: Secondary | ICD-10-CM | POA: Diagnosis not present

## 2019-01-06 DIAGNOSIS — Z7982 Long term (current) use of aspirin: Secondary | ICD-10-CM

## 2019-01-06 LAB — POCT GLYCOSYLATED HEMOGLOBIN (HGB A1C): Hemoglobin A1C: 7.6 % — AB (ref 4.0–5.6)

## 2019-01-06 LAB — GLUCOSE, CAPILLARY: Glucose-Capillary: 142 mg/dL — ABNORMAL HIGH (ref 70–99)

## 2019-01-06 NOTE — Progress Notes (Signed)
76

## 2019-01-06 NOTE — Patient Instructions (Signed)
Mr. Konopa,  Your diabetes and blood pressure have improved a lot since last time I saw you.  Keep up the great work!  I will speak to our pharmacist to see if we can switch your diabetes medicine to a more affordable one.   We will check lab work today and will give you a call with the results.  I will also request records from [facility] about your colonoscopy to make sure there is nothing we need to follow-up on.  You received your flu shot today.  Please call us if you have any questions or concerns.  - Dr. Evelene Croon

## 2019-01-06 NOTE — Progress Notes (Signed)
Retinal images were done and transmitted today.  Norm Parcel, RD 01/06/2019 4:19 PM.

## 2019-01-07 LAB — BMP8+ANION GAP
ANION GAP: 22 mmol/L — AB (ref 10.0–18.0)
BUN/Creatinine Ratio: 23 (ref 10–24)
BUN: 44 mg/dL — AB (ref 8–27)
CO2: 18 mmol/L — AB (ref 20–29)
Calcium: 9.8 mg/dL (ref 8.6–10.2)
Chloride: 98 mmol/L (ref 96–106)
Creatinine, Ser: 1.9 mg/dL — ABNORMAL HIGH (ref 0.76–1.27)
GFR calc Af Amer: 41 mL/min/{1.73_m2} — ABNORMAL LOW (ref >59)
GFR calc non Af Amer: 35 mL/min/{1.73_m2} — ABNORMAL LOW (ref >59)
Glucose: 150 mg/dL — ABNORMAL HIGH (ref 65–99)
Potassium: 5 mmol/L (ref 3.5–5.2)
SODIUM: 138 mmol/L (ref 134–144)

## 2019-01-07 LAB — LIPID PANEL
CHOLESTEROL TOTAL: 154 mg/dL (ref 100–199)
Chol/HDL Ratio: 3.9 ratio (ref 0.0–5.0)
HDL: 39 mg/dL — ABNORMAL LOW (ref >39)
LDL Calculated: 56 mg/dL (ref 0–99)
TRIGLYCERIDES: 297 mg/dL — AB (ref 0–149)
VLDL Cholesterol Cal: 59 mg/dL — ABNORMAL HIGH (ref 5–40)

## 2019-01-08 ENCOUNTER — Encounter: Payer: Self-pay | Admitting: Internal Medicine

## 2019-01-08 NOTE — Assessment & Plan Note (Addendum)
Patient was recently switched to Invokamet due to uncontrolled DM. His A1c today is 7.6 from 8.4. He denies polyuria, polydipsia, urinary frequency and urgency. Will message Dr. Selena Batten about medication cost as patient states he is paying $90 for Invokamet. Foot and eye exam performed today.

## 2019-01-08 NOTE — Assessment & Plan Note (Signed)
On high intensity statin. Ordered lipid panel today. Continue atorva 80 daily.

## 2019-01-08 NOTE — Progress Notes (Signed)
   CC: HTN and DM follow up   HPI:  [name] is a 68 y.o. male with PMH listed below who presents for follow up of HTN and DM. Please see problem based assessment and plan for more details.   Past Medical History:  Diagnosis Date  . Carotid artery occlusion   . Diabetes mellitus without complication (HCC)    Type II, takes metformin per patient(11/01/15)  . Hyperlipemia   . Hypertension   . Stroke [facility]) 06/2013   Left side- weak, unable to make a fist   Review of Systems:   Review of Systems  Constitutional: Negative for chills and fever.  Respiratory: Negative for cough and shortness of breath.   Cardiovascular: Negative for chest pain and leg swelling.  Gastrointestinal: Negative for abdominal pain, constipation, diarrhea, nausea and vomiting.  Genitourinary: Negative for frequency and urgency.  Musculoskeletal: Negative for myalgias.  Neurological: Negative for dizziness and headaches.    Physical Exam:  Vitals:   01/06/19 1346  BP: 108/65  Pulse: 88  Temp: (!) 97.4 F (36.3 C)  TempSrc: Oral  SpO2: 97%  Weight: 148 lb (67.1 kg)   General: elderly pleasant male in NAD CV: RRR, nl S1/S2, no mrg Pulm: CTAB, no wheezes or crackles  PQA:ESLP and well perfused, 1+ DP and TP pulses   Assessment & Plan:   See Encounters Tab for problem based charting.  Patient discussed with Dr. Heide Spark

## 2019-01-08 NOTE — Assessment & Plan Note (Signed)
Reports compliance with aspirin 81 daily and Lipitor 80 daily. Continue current regimen. Will check lipid panel today.

## 2019-01-08 NOTE — Assessment & Plan Note (Signed)
BP well controlled today on lisinopril-HCTZ20-12.5 mg BID. Continue current regimen. Ordered BMP to check renal function given mild AKI on previous one.

## 2019-01-09 ENCOUNTER — Other Ambulatory Visit: Payer: Self-pay | Admitting: Pharmacist

## 2019-01-09 DIAGNOSIS — E1151 Type 2 diabetes mellitus with diabetic peripheral angiopathy without gangrene: Secondary | ICD-10-CM

## 2019-01-09 MED ORDER — EMPAGLIFLOZIN-METFORMIN HCL 5-1000 MG PO TABS: 1.0000 | ORAL_TABLET | Freq: Two times a day (BID) | ORAL | 0 refills | Status: DC

## 2019-01-10 NOTE — Progress Notes (Signed)
Internal Medicine Clinic Attending  Case discussed with Dr. Lovenia Kim at the time of the visit.  We reviewed the resident's history and exam and pertinent patient test results.  I agree with the assessment, diagnosis, and plan of care documented in the resident's note.  Patient to follow up for repeat BMP in 1-2 weeks given AKI. HCTZ and ACE-I are on hold for now. Will f/u renal sono.

## 2019-01-12 ENCOUNTER — Ambulatory Visit: Payer: PPO

## 2019-01-13 ENCOUNTER — Ambulatory Visit (INDEPENDENT_AMBULATORY_CARE_PROVIDER_SITE_OTHER): Payer: PPO | Admitting: Internal Medicine

## 2019-01-13 ENCOUNTER — Other Ambulatory Visit: Payer: Self-pay

## 2019-01-13 ENCOUNTER — Encounter: Payer: Self-pay | Admitting: Internal Medicine

## 2019-01-13 VITALS — BP 103/55 | HR 65 | Temp 97.9°F | Ht 65.5 in | Wt 147.9 lb

## 2019-01-13 DIAGNOSIS — I69354 Hemiplegia and hemiparesis following cerebral infarction affecting left non-dominant side: Secondary | ICD-10-CM | POA: Diagnosis not present

## 2019-01-13 DIAGNOSIS — N189 Chronic kidney disease, unspecified: Secondary | ICD-10-CM | POA: Insufficient documentation

## 2019-01-13 DIAGNOSIS — E785 Hyperlipidemia, unspecified: Secondary | ICD-10-CM

## 2019-01-13 DIAGNOSIS — Z7984 Long term (current) use of oral hypoglycemic drugs: Secondary | ICD-10-CM

## 2019-01-13 DIAGNOSIS — E119 Type 2 diabetes mellitus without complications: Secondary | ICD-10-CM

## 2019-01-13 DIAGNOSIS — Z79899 Other long term (current) drug therapy: Secondary | ICD-10-CM | POA: Diagnosis not present

## 2019-01-13 DIAGNOSIS — I10 Essential (primary) hypertension: Secondary | ICD-10-CM | POA: Diagnosis not present

## 2019-01-13 DIAGNOSIS — N179 Acute kidney failure, unspecified: Secondary | ICD-10-CM

## 2019-01-13 HISTORY — DX: Chronic kidney disease, unspecified: N18.9

## 2019-01-13 MED ORDER — AMLODIPINE BESYLATE 5 MG PO TABS: 5.0000 mg | ORAL_TABLET | Freq: Every day | ORAL | 11 refills | Status: DC

## 2019-01-13 NOTE — Assessment & Plan Note (Addendum)
AKI found on recent labs after addition of invokana; otherwise no other s/sx to suspect other etiology at this time.  Plan: --Stop lisinopril-hctz --continue invokana --start amlodipine 5mg  daily --f/u in 1wk for BP check and Bmet

## 2019-01-13 NOTE — Progress Notes (Signed)
   CC: AKI  HPI:  [name] is a 68 y.o. with a PMH of HTN, T2DM, HLD, and h/o CVA with residual left sided deficits presenting to clinic for f/u on AKI and HTN.  A [place] interpreter was used to facilitate patient encounter.  AKI: At PCP visit 1 wk ago, renal function was checked to assess previous mild Cr elevation. Labs now revealed worsening renal function with Cr of 1.9. He had medication change to include Invokana. He was asked to stop HCTZ-lisinopril, however he has not done this. He denies dizziness on standing, chest pain, shortness of breath, recent illness with vomiting/diarrhea, changes in PO intake, melena, hematochezia, hematuria, abd or back pain.   Please see problem based Assessment and Plan for status of patients chronic conditions.  Past Medical History:  Diagnosis Date  . Carotid artery occlusion   . Diabetes mellitus without complication (HCC)    Type II, takes metformin per patient(11/01/15)  . Hyperlipemia   . Hypertension   . Stroke [facility]) 06/2013   Left side- weak, unable to make a fist    Review of Systems:   Per HPI  Physical Exam:  Vitals:   01/13/19 0933  BP: (!) 103/55  Pulse: 65  Temp: 97.9 F (36.6 C)  TempSrc: Oral  SpO2: 100%  Weight: 147 lb 14.4 oz (67.1 kg)  Height: 5' 5.5" (1.664 m)   GENERAL- alert, co-operative, appears as stated age, not in any distress. HEENT- oral mucosa appears moist. CARDIAC- RRR, no murmurs, rubs or gallops. RESP- Moving equal volumes of air, and clear to auscultation bilaterally, no wheezes or crackles. ABDOMEN- Soft, nontender, bowel sounds present. EXTREMITIES- pulse 2+ PT, symmetric, no pedal edema. SKIN- Warm, dry, normal skin turgor  Assessment & Plan:   See Encounters Tab for problem based charting.   Patient discussed with Dr. Fredrich Romans, MD Internal Medicine PGY-3

## 2019-01-13 NOTE — Assessment & Plan Note (Addendum)
On recent labs, patient found to have AKI with Cr up to 1.9 from previous 1.1-1.3. HCTZ-lisinopril has been held; has had recent addition of canagliflozin for DM management.   As patient has not stopped HCTZ-lisinopril yet and no alarming symptoms or signs present currently, will repeat Bmet next week.  Plan: --stop HCTZ-Lisinopril --start amlodipine 5mg  daily --f/u in 1wk for BP check and Bmet

## 2019-01-13 NOTE — Patient Instructions (Signed)
Stop taking lisinopril-HCTZ Start taking amlodipine 5mg  once daily  We'll see you in 1 week to check on your blood pressure and recheck your kidney function.

## 2019-01-20 ENCOUNTER — Other Ambulatory Visit: Payer: Self-pay

## 2019-01-20 ENCOUNTER — Encounter: Payer: Self-pay | Admitting: Internal Medicine

## 2019-01-20 ENCOUNTER — Ambulatory Visit (INDEPENDENT_AMBULATORY_CARE_PROVIDER_SITE_OTHER): Payer: PPO | Admitting: Internal Medicine

## 2019-01-20 VITALS — BP 116/56 | HR 75 | Temp 98.6°F | Ht 64.0 in | Wt 148.0 lb

## 2019-01-20 DIAGNOSIS — E119 Type 2 diabetes mellitus without complications: Secondary | ICD-10-CM | POA: Diagnosis not present

## 2019-01-20 DIAGNOSIS — I10 Essential (primary) hypertension: Secondary | ICD-10-CM | POA: Diagnosis not present

## 2019-01-20 DIAGNOSIS — N179 Acute kidney failure, unspecified: Secondary | ICD-10-CM

## 2019-01-20 DIAGNOSIS — Z7984 Long term (current) use of oral hypoglycemic drugs: Secondary | ICD-10-CM

## 2019-01-20 DIAGNOSIS — I69354 Hemiplegia and hemiparesis following cerebral infarction affecting left non-dominant side: Secondary | ICD-10-CM | POA: Diagnosis not present

## 2019-01-20 DIAGNOSIS — Z79899 Other long term (current) drug therapy: Secondary | ICD-10-CM

## 2019-01-20 DIAGNOSIS — E785 Hyperlipidemia, unspecified: Secondary | ICD-10-CM | POA: Diagnosis not present

## 2019-01-20 NOTE — Assessment & Plan Note (Signed)
BP well controlled on SGLT2I and amlodipine 5mg  daily  Plan: --continue current regimen --at f/u with PCP, if renal function returns back to baseline, would consider adding back ACE-I as he likely has underlying diabetic nephropathy based on recent labs.

## 2019-01-20 NOTE — Progress Notes (Signed)
   CC: AKI  HPI:  Juan [name] is a 68 y.o. with a PMH of HTN, HLD, T2DM, h/o CVA with residual left sided deficits presents to clinic for follow up on HTN and AKI.  Patient interviewed with assistance of interpreter.   Patient noted to have AKI while on HCTZ-lisinopril chronically for HTN and addition of invokana for T2DM. At last visit, HCTZ-lisinopril was stopped and amlodipine 5mg  daily was started. Patient denies chest pain, shortness of breath, HA, vision or hearing changes, and LE swelling.   Please see problem based Assessment and Plan for status of patients chronic conditions.  Past Medical History:  Diagnosis Date  . Carotid artery occlusion   . Diabetes mellitus without complication (HCC)    Type II, takes metformin per patient(11/01/15)  . Hyperlipemia   . Hypertension   . Stroke [facility]) 06/2013   Left side- weak, unable to make a fist    Review of Systems:   Per HPI  Physical Exam:  Vitals:   01/20/19 1101  BP: 139/64  Pulse: 75  Temp: 98.6 F (37 C)  TempSrc: Oral  SpO2: 100%  Weight: 148 lb (67.1 kg)  Height: 5\' 4"  (1.626 m)   GENERAL- alert, co-operative, appears as stated age, not in any distress. HEENT- oral mucosa appears moist. CARDIAC- RRR, no murmurs, rubs or gallops. RESP- Moving equal volumes of air, and clear to auscultation bilaterally, no wheezes or crackles. ABDOMEN- Soft, nontender, bowel sounds present. EXTREMITIES- pulse 2+ PT/DP, symmetric, no pedal edema.  Assessment & Plan:   See Encounters Tab for problem based charting.   Patient discussed with Dr. Cherene Altes, MD Internal Medicine PGY-3

## 2019-01-20 NOTE — Assessment & Plan Note (Addendum)
Patient noted to have AKI while on HCTZ-lisinopril chronically for HTN and addition of invokana for T2DM. At last visit, HCTZ-lisinopril was stopped and amlodipine 5mg  daily was started.  Plan: --f/u Bmet to ensure resolution of AKI  Addendum: Cr improved to 1.57, however still elevated compared to previous baseline. This may indicate progression of his CKD. Would consider repeating at f/u visit. Results were sent to patient.

## 2019-01-20 NOTE — Patient Instructions (Signed)
Continue amlodipine 5mg  daily. We'll repeat labs today to see if your kidneys have recovered. I will call if it's still not normal, otherwise keep your appointment with Dr. Evelene CroonSantos in April.

## 2019-01-21 LAB — BMP8+ANION GAP
Anion Gap: 18 mmol/L (ref 10.0–18.0)
BUN / CREAT RATIO: 11 (ref 10–24)
BUN: 17 mg/dL (ref 8–27)
CALCIUM: 9.7 mg/dL (ref 8.6–10.2)
CHLORIDE: 102 mmol/L (ref 96–106)
CO2: 21 mmol/L (ref 20–29)
CREATININE: 1.57 mg/dL — AB (ref 0.76–1.27)
GFR calc Af Amer: 52 mL/min/{1.73_m2} — ABNORMAL LOW (ref >59)
GFR calc non Af Amer: 45 mL/min/{1.73_m2} — ABNORMAL LOW (ref >59)
Glucose: 117 mg/dL — ABNORMAL HIGH (ref 65–99)
Potassium: 4.7 mmol/L (ref 3.5–5.2)
Sodium: 141 mmol/L (ref 134–144)

## 2019-01-21 NOTE — Progress Notes (Signed)
Internal Medicine Clinic Attending  Case discussed with Dr. Svalina  at the time of the visit.  We reviewed the resident's history and exam and pertinent patient test results.  I agree with the assessment, diagnosis, and plan of care documented in the resident's note.  

## 2019-01-23 ENCOUNTER — Other Ambulatory Visit: Payer: Self-pay | Admitting: Internal Medicine

## 2019-01-23 DIAGNOSIS — E1151 Type 2 diabetes mellitus with diabetic peripheral angiopathy without gangrene: Secondary | ICD-10-CM

## 2019-01-23 NOTE — Progress Notes (Signed)
Internal Medicine Clinic Attending  Case discussed with Dr. Svalina  at the time of the visit.  We reviewed the resident's history and exam and pertinent patient test results.  I agree with the assessment, diagnosis, and plan of care documented in the resident's note.  

## 2019-01-25 ENCOUNTER — Encounter: Payer: Self-pay | Admitting: Internal Medicine

## 2019-03-11 ENCOUNTER — Other Ambulatory Visit: Payer: Self-pay | Admitting: Pharmacist

## 2019-03-11 DIAGNOSIS — E1151 Type 2 diabetes mellitus with diabetic peripheral angiopathy without gangrene: Secondary | ICD-10-CM

## 2019-03-14 DIAGNOSIS — M542 Cervicalgia: Secondary | ICD-10-CM | POA: Diagnosis not present

## 2019-03-15 NOTE — Telephone Encounter (Signed)
Dr. Selena Batten,   Is Mr. Mudgett on [place] or Invokamet? I see that you sent the prescription for Synjardy a couple of months ago. I have not seen him in a while and last time I saw him he was on Invokamet. Not sure if there was change related to insurance coverage issues. I'm ok with either medication, just wanted to double check with you before approving the refill for Synjardy. Thanks!   Antony Madura

## 2019-03-16 NOTE — Telephone Encounter (Addendum)
Sorry about the confusion, we had to switch to [place] for formulary coverage. I fixed the med list.

## 2019-03-22 ENCOUNTER — Encounter: Payer: Self-pay | Admitting: *Deleted

## 2019-04-07 ENCOUNTER — Encounter: Payer: PPO | Admitting: Internal Medicine

## 2019-04-20 ENCOUNTER — Other Ambulatory Visit: Payer: Self-pay

## 2019-04-20 NOTE — Telephone Encounter (Signed)
Has refills at pharm, they will fill and call him per natalie

## 2019-04-20 NOTE — Telephone Encounter (Signed)
amLODipine (NORVASC) 5 MG tablet     aspirin 81 MG EC tablet     atorvastatin (LIPITOR) 80 MG tablet     SYNJARDY 04-999 MG TABS    Refill request @  [id number] - [place], [place] - [address] [phone] (Phone) [phone] (Fax)

## 2019-04-21 ENCOUNTER — Encounter: Payer: PPO | Admitting: Internal Medicine

## 2019-05-29 ENCOUNTER — Other Ambulatory Visit: Payer: Self-pay | Admitting: Internal Medicine

## 2019-05-29 DIAGNOSIS — I10 Essential (primary) hypertension: Secondary | ICD-10-CM

## 2019-06-05 ENCOUNTER — Ambulatory Visit (INDEPENDENT_AMBULATORY_CARE_PROVIDER_SITE_OTHER): Payer: PPO | Admitting: Internal Medicine

## 2019-06-05 ENCOUNTER — Encounter: Payer: Self-pay | Admitting: Internal Medicine

## 2019-06-05 ENCOUNTER — Other Ambulatory Visit: Payer: Self-pay

## 2019-06-05 VITALS — BP 110/69 | HR 80 | Temp 98.1°F | Wt 147.3 lb

## 2019-06-05 DIAGNOSIS — Z79899 Other long term (current) drug therapy: Secondary | ICD-10-CM | POA: Diagnosis not present

## 2019-06-05 DIAGNOSIS — Z7984 Long term (current) use of oral hypoglycemic drugs: Secondary | ICD-10-CM | POA: Diagnosis not present

## 2019-06-05 DIAGNOSIS — E785 Hyperlipidemia, unspecified: Secondary | ICD-10-CM | POA: Diagnosis not present

## 2019-06-05 DIAGNOSIS — Z8673 Personal history of transient ischemic attack (TIA), and cerebral infarction without residual deficits: Secondary | ICD-10-CM

## 2019-06-05 DIAGNOSIS — E1151 Type 2 diabetes mellitus with diabetic peripheral angiopathy without gangrene: Secondary | ICD-10-CM | POA: Diagnosis not present

## 2019-06-05 DIAGNOSIS — N179 Acute kidney failure, unspecified: Secondary | ICD-10-CM

## 2019-06-05 DIAGNOSIS — E1169 Type 2 diabetes mellitus with other specified complication: Secondary | ICD-10-CM

## 2019-06-05 DIAGNOSIS — I63239 Cerebral infarction due to unspecified occlusion or stenosis of unspecified carotid arteries: Secondary | ICD-10-CM

## 2019-06-05 DIAGNOSIS — Z9114 Patient's other noncompliance with medication regimen: Secondary | ICD-10-CM

## 2019-06-05 DIAGNOSIS — Z7982 Long term (current) use of aspirin: Secondary | ICD-10-CM

## 2019-06-05 DIAGNOSIS — I10 Essential (primary) hypertension: Secondary | ICD-10-CM

## 2019-06-05 LAB — POCT GLYCOSYLATED HEMOGLOBIN (HGB A1C): Hemoglobin A1C: 8.2 % — AB (ref 4.0–5.6)

## 2019-06-05 LAB — GLUCOSE, CAPILLARY: Glucose-Capillary: 122 mg/dL — ABNORMAL HIGH (ref 70–99)

## 2019-06-05 NOTE — Assessment & Plan Note (Signed)
Well controlled on amlodipine 10 mg QD. Will continue.  

## 2019-06-05 NOTE — Assessment & Plan Note (Signed)
LDL at goal on high-intensity statin. Last lipid panel did show hypertriglyceridemia at 297. Will discuss adding fenofibrate once I have an updated list of his medications.

## 2019-06-05 NOTE — Progress Notes (Signed)
   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 Please see problem based assessment and plan for further details.   Past Medical History:  Diagnosis Date  . Carotid artery occlusion   . Diabetes mellitus without complication (HCC)    Type II, takes metformin per patient(11/01/15)  . Hyperlipemia   . Hypertension   . Stroke [facility]) 06/2013   Left side- weak, unable to make a fist   Review of Systems:   Review of Systems  Constitutional: Negative for chills, fever, malaise/fatigue and weight loss.  Eyes: Negative for blurred vision and double vision.  Respiratory: Negative for cough and shortness of breath.   Cardiovascular: Negative for leg swelling.  Gastrointestinal: Negative for abdominal pain, constipation, diarrhea and nausea.  Genitourinary: Negative for dysuria, frequency and urgency.  Neurological: Negative for sensory change, speech change, focal weakness and weakness.    Physical Exam:  Vitals:   06/05/19 1330  BP: 110/69  Pulse: 80  Temp: 98.1 F (36.7 C)  TempSrc: Oral  SpO2: 100%  Weight: 147 lb 4.8 oz (66.8 kg)    General: elderly male who appears younger than stated age in no acute distress  Cardiac: regular rate and rhythm, nl S1/S2, no murmurs, rubs or gallops, no JVD  Pulm: CTAB, no wheezes or crackles, no increased work of breathing on room air  Abd: soft, NTND, bowel sounds present Ext: warm and well perfused, no peripheral edema   Assessment & Plan:   See Encounters Tab for problem based charting.  Patient discussed with Dr. Lynnae January

## 2019-06-05 NOTE — Assessment & Plan Note (Signed)
Patient developed AKI while on HCTZ-lisinopril which has since been discontinued. Last Cr 1.57 on 12/2018. Renal function has varied between 1.1-1.9. He likely has underlying CKD secondary to uncontrolled DM.  - Follow up BMP  - Plan to add low dose lisinopril for proteinuria to prevent progression of CKD

## 2019-06-05 NOTE — Progress Notes (Signed)
82

## 2019-06-05 NOTE — Assessment & Plan Note (Signed)
Patient states he follows a with vascular surgery (Dr. Donnetta Hutching) on a yearly basis.  States he was last seen last year and his next appointment is for next year.  However, last note from Dr. Donnetta Hutching is from 04/2016. He no-showed to his appt on 05/2017. I suspect he was lost to follow up after that. Will call [facility] office to schedule an appt. Will place new referral if needed.

## 2019-06-05 NOTE — Assessment & Plan Note (Signed)
Reports compliance with ASA and atorvastatin. Will continue and ensure he is taking both as he has had issues with medication compliance in the past.

## 2019-06-05 NOTE — Patient Instructions (Signed)
Mr. Konicek,   I did not make any adjustments to medications today so please continue taking your current medicines.  Please give [place] a call when you get home and let us know all the medicines that you are taking.   Your diabetes is slightly worse than 3 months ago.  I will wait for your blood work results to see if we can add a new medicine or increase the dose of the one you are taking.  Your blood pressure is under excellent control.  Please continue taking amlodipine 10 daily.  Make sure to continue to follow-up with vascular surgery every year.  Please make a follow-up appointment with me in 3 months to check up on your diabetes.  Call us if you have any questions or concerns.  - Dr. Frederico Hamman

## 2019-06-05 NOTE — Assessment & Plan Note (Addendum)
Patient presents for type 2 diabetes follow-up.  His diabetes management has been complicated by medication nonadherence.  His barriers for this are unclear as he reports he understands how to take the medications and is able to afford them.  He unfortunately has declined [facility] services for several times. He is on Synjardy 5--1000 mg twice daily.  He reports compliance but did not bring his medications today to verify.  From review of chart it does seem like he picked up his medicines.  A1c slightly worse, 7.6-8.2.  - No medication changes today as I am not sure what he is taking at home. Grandson with patient today. I asked him to call us when he gets home to give [place] a list of medications.  - Will need low dose lisinopril for proteinuria but will wait for BMP results and updated medication list  - Foot exam done today  - Unable to perform eye exam today, will scheduled in conjunction with his next follow up appt in 3 months. If unable to obtain then, will refer to ophthalmology.

## 2019-06-06 LAB — BMP8+ANION GAP
Anion Gap: 16 mmol/L (ref 10.0–18.0)
BUN/Creatinine Ratio: 14 (ref 10–24)
BUN: 23 mg/dL (ref 8–27)
CO2: 20 mmol/L (ref 20–29)
Calcium: 9.4 mg/dL (ref 8.6–10.2)
Chloride: 104 mmol/L (ref 96–106)
Creatinine, Ser: 1.66 mg/dL — ABNORMAL HIGH (ref 0.76–1.27)
GFR calc Af Amer: 48 mL/min/{1.73_m2} — ABNORMAL LOW (ref >59)
GFR calc non Af Amer: 41 mL/min/{1.73_m2} — ABNORMAL LOW (ref >59)
Glucose: 116 mg/dL — ABNORMAL HIGH (ref 65–99)
Potassium: 5.1 mmol/L (ref 3.5–5.2)
Sodium: 140 mmol/L (ref 134–144)

## 2019-06-09 ENCOUNTER — Encounter: Payer: PPO | Admitting: Internal Medicine

## 2019-06-09 NOTE — Progress Notes (Signed)
Internal Medicine Clinic Attending  Case discussed with Dr. Santos-Sanchez at the time of the visit.  We reviewed the resident's history and exam and pertinent patient test results.  I agree with the assessment, diagnosis, and plan of care documented in the resident's note.    

## 2019-06-15 ENCOUNTER — Other Ambulatory Visit: Payer: Self-pay | Admitting: Internal Medicine

## 2019-06-16 ENCOUNTER — Other Ambulatory Visit: Payer: Self-pay | Admitting: Internal Medicine

## 2019-06-16 DIAGNOSIS — I10 Essential (primary) hypertension: Secondary | ICD-10-CM

## 2019-06-16 DIAGNOSIS — E1151 Type 2 diabetes mellitus with diabetic peripheral angiopathy without gangrene: Secondary | ICD-10-CM

## 2019-06-16 MED ORDER — DILTIAZEM HCL ER COATED BEADS 120 MG PO CP24: 120.0000 mg | ORAL_CAPSULE | Freq: Every day | ORAL | 1 refills | Status: DC

## 2019-06-16 MED ORDER — ASPIRIN 81 MG PO TBEC: 81.0000 mg | DELAYED_RELEASE_TABLET | Freq: Every day | ORAL | 3 refills | Status: DC

## 2019-06-23 ENCOUNTER — Other Ambulatory Visit: Payer: PPO

## 2019-07-17 ENCOUNTER — Encounter: Payer: Self-pay | Admitting: Internal Medicine

## 2019-07-17 ENCOUNTER — Encounter: Payer: PPO | Admitting: Internal Medicine

## 2019-07-24 ENCOUNTER — Ambulatory Visit (INDEPENDENT_AMBULATORY_CARE_PROVIDER_SITE_OTHER): Payer: PPO | Admitting: Internal Medicine

## 2019-07-24 ENCOUNTER — Other Ambulatory Visit: Payer: Self-pay

## 2019-07-24 ENCOUNTER — Encounter: Payer: Self-pay | Admitting: Internal Medicine

## 2019-07-24 VITALS — BP 144/54 | HR 74 | Temp 98.0°F | Wt 149.4 lb

## 2019-07-24 DIAGNOSIS — I10 Essential (primary) hypertension: Secondary | ICD-10-CM

## 2019-07-24 DIAGNOSIS — N183 Chronic kidney disease, stage 3 unspecified: Secondary | ICD-10-CM

## 2019-07-24 DIAGNOSIS — E1151 Type 2 diabetes mellitus with diabetic peripheral angiopathy without gangrene: Secondary | ICD-10-CM | POA: Diagnosis not present

## 2019-07-24 DIAGNOSIS — E118 Type 2 diabetes mellitus with unspecified complications: Secondary | ICD-10-CM | POA: Diagnosis not present

## 2019-07-24 DIAGNOSIS — Z79899 Other long term (current) drug therapy: Secondary | ICD-10-CM | POA: Diagnosis not present

## 2019-07-24 DIAGNOSIS — I129 Hypertensive chronic kidney disease with stage 1 through stage 4 chronic kidney disease, or unspecified chronic kidney disease: Secondary | ICD-10-CM

## 2019-07-24 DIAGNOSIS — N189 Chronic kidney disease, unspecified: Secondary | ICD-10-CM | POA: Diagnosis not present

## 2019-07-24 DIAGNOSIS — E1122 Type 2 diabetes mellitus with diabetic chronic kidney disease: Secondary | ICD-10-CM

## 2019-07-24 DIAGNOSIS — Z7984 Long term (current) use of oral hypoglycemic drugs: Secondary | ICD-10-CM | POA: Diagnosis not present

## 2019-07-24 LAB — BASIC METABOLIC PANEL
Anion gap: 9 (ref 5–15)
BUN: 14 mg/dL (ref 8–23)
CO2: 27 mmol/L (ref 22–32)
Calcium: 9.4 mg/dL (ref 8.9–10.3)
Chloride: 105 mmol/L (ref 98–111)
Creatinine, Ser: 1.55 mg/dL — ABNORMAL HIGH (ref 0.61–1.24)
GFR calc Af Amer: 52 mL/min — ABNORMAL LOW (ref >60)
GFR calc non Af Amer: 45 mL/min — ABNORMAL LOW (ref >60)
Glucose, Bld: 132 mg/dL — ABNORMAL HIGH (ref 70–99)
Potassium: 4.7 mmol/L (ref 3.5–5.1)
Sodium: 141 mmol/L (ref 135–145)

## 2019-07-24 NOTE — Patient Instructions (Addendum)
Juan [name],   Your last set of blood tests showed some damage to your kidneys. This is likely being caused by high blood pressure and diabetes. I ordered an kidney ultrasound to check on your kidneys. The radiology department will call you to set this up.   I also ordered blood work and urine to check on your kidneys. Depending on how it looks I will add a medication called lisinopril to protect your kidneys.    DO NOT TAKE any products that contain Ibuprofen such advil, aleve, motrin, naproxen, BC or goody powder, etc. If you are not sure what to take, call us and we will be happy to assist you.   Follow up with me in 4-6 weeks.   - Dr. Frederico Hamman

## 2019-07-25 ENCOUNTER — Encounter: Payer: Self-pay | Admitting: Internal Medicine

## 2019-07-25 LAB — MICROALBUMIN / CREATININE URINE RATIO
Creatinine, Urine: 122.6 mg/dL
Microalb/Creat Ratio: 265 mg/g creat — ABNORMAL HIGH (ref 0–29)
Microalbumin, Urine: 324.5 ug/mL

## 2019-07-25 NOTE — Assessment & Plan Note (Signed)
This is likely secondary to uncontrolled diabetes and hypertension.  Reports good urine output and does not have any signs or symptoms of volume overload on exam. His baseline creatinine appears to be between 1.5 and 1.6.  He has not had a work-up for this.  He denies use of nephrotoxic medications.  However he used to be on lisinopril-HCTZ for HTN and has continued to intermittently take it at times despite discontinuation of medication.  He has been counseled numerous times to bring medications to his visit but always forgets.  We usually depend family members to get an accurate medication list.  We will give him a call again today to ensure he is not taking the combination pill.  We discussed doing a renal ultrasound and repeating a BMP to check renal function and electrolytes. Discussed avoiding any type of NSAID and only using Tylenol if he has pain.  Also discussed not to take any over-the-counter medication unless discussed with a provider.

## 2019-07-25 NOTE — Assessment & Plan Note (Addendum)
Presents for T2DM follow-up. Only on Synjardy which he states he is compliant with. He denies any symptoms. Unfortunately, he did not bring his medications to this visit. A1c 8.2 in  June from 7.6 six months ago. This is in the setting of CKD so I would expect A1c to be falsely low. I am not sure why his A1c continues to worsen if he is being complaint with medications and low carb diet. We will call his family to go over his medications once again. Follow up in 2 months to repeat A1c. I will call him the day before his visit to remind him to bring his medications. If A1c continues to worsen will make further adjustments in medications.  I also order urine microalbumin to check for proteinuria.  He will benefit from addition of lisinopril for diabetes and CKD, however we will have to be very careful with sedation in view of medications as patient has confused him in the past and taken duplicate doses of same medicines.

## 2019-07-25 NOTE — Progress Notes (Signed)
   CC: Follow up for HTN , T2DM, and CKD   HPI:  Juan [name] is a 68 y.o. year-old male with PMH listed below who presents to clinic for follow up of HTN, T2DM, and CKD. Please see problem based assessment and plan for further details.   Past Medical History:  Diagnosis Date  . Carotid artery occlusion   . Diabetes mellitus without complication (HCC)    Type II, takes metformin per patient(11/01/15)  . Hyperlipemia   . Hypertension   . Stroke [facility]) 06/2013   Left side- weak, unable to make a fist   Review of Systems:   Review of Systems  Constitutional: Negative for chills, fever, malaise/fatigue and weight loss.  Respiratory: Negative for cough and shortness of breath.   Cardiovascular: Negative for chest pain and leg swelling.  Genitourinary: Negative for frequency and urgency.  Neurological: Negative for dizziness and headaches.    Physical Exam:  Vitals:   07/24/19 1426  BP: (!) 144/54  Pulse: 74  Temp: 98 F (36.7 C)  TempSrc: Oral  SpO2: 98%  Weight: 149 lb 6.4 oz (67.8 kg)    General: well-appearing male in NAD  Cardiac: regular rate and rhythm, nl S1/S2, no murmurs, rubs or gallops  Pulm: CTAB, no wheezes or crackles, no increased work of breathing on room air  Ext: warm and well perfused, no peripheral edema, 2+ DP pulses bilaterally     Assessment & Plan:   See Encounters Tab for problem based charting.  Patient discussed with Dr. Daryll Drown

## 2019-07-25 NOTE — Assessment & Plan Note (Signed)
Patient presents for HTN follow up.  Antihypertensive regimen has been adjusted recently due to uncontrolled hypertension.  Most recently he was started on diltiazem 120 mg daily which he reports compliance with. BP today 144/54 and 130/56 when repeated. Will continue current regimen for now. May add lisinopril based on urine microalbumin results and if I am able to appropriately review patient's medications.

## 2019-07-26 NOTE — Progress Notes (Signed)
Internal Medicine Clinic Attending  Case discussed with Dr. Santos-Sanchez at the time of the visit.  We reviewed the resident's history and exam and pertinent patient test results.  I agree with the assessment, diagnosis, and plan of care documented in the resident's note.    

## 2019-08-03 NOTE — Addendum Note (Signed)
Addended by: [name] on: 08/03/2019 09:29 PM   Modules accepted: Orders

## 2019-08-16 ENCOUNTER — Ambulatory Visit (HOSPITAL_COMMUNITY): Payer: PPO

## 2019-08-21 ENCOUNTER — Other Ambulatory Visit: Payer: Self-pay

## 2019-08-21 ENCOUNTER — Encounter: Payer: Self-pay | Admitting: Internal Medicine

## 2019-08-21 ENCOUNTER — Ambulatory Visit (INDEPENDENT_AMBULATORY_CARE_PROVIDER_SITE_OTHER): Payer: PPO | Admitting: Internal Medicine

## 2019-08-21 VITALS — BP 193/68 | HR 64 | Temp 98.0°F | Wt 149.6 lb

## 2019-08-21 DIAGNOSIS — E1122 Type 2 diabetes mellitus with diabetic chronic kidney disease: Secondary | ICD-10-CM | POA: Diagnosis not present

## 2019-08-21 DIAGNOSIS — Z79899 Other long term (current) drug therapy: Secondary | ICD-10-CM

## 2019-08-21 DIAGNOSIS — I129 Hypertensive chronic kidney disease with stage 1 through stage 4 chronic kidney disease, or unspecified chronic kidney disease: Secondary | ICD-10-CM | POA: Diagnosis not present

## 2019-08-21 DIAGNOSIS — Z7984 Long term (current) use of oral hypoglycemic drugs: Secondary | ICD-10-CM | POA: Diagnosis not present

## 2019-08-21 DIAGNOSIS — I10 Essential (primary) hypertension: Secondary | ICD-10-CM

## 2019-08-21 DIAGNOSIS — D72829 Elevated white blood cell count, unspecified: Secondary | ICD-10-CM

## 2019-08-21 DIAGNOSIS — N183 Chronic kidney disease, stage 3 unspecified: Secondary | ICD-10-CM

## 2019-08-21 DIAGNOSIS — I63239 Cerebral infarction due to unspecified occlusion or stenosis of unspecified carotid arteries: Secondary | ICD-10-CM

## 2019-08-21 DIAGNOSIS — E1151 Type 2 diabetes mellitus with diabetic peripheral angiopathy without gangrene: Secondary | ICD-10-CM | POA: Diagnosis not present

## 2019-08-21 DIAGNOSIS — Z8673 Personal history of transient ischemic attack (TIA), and cerebral infarction without residual deficits: Secondary | ICD-10-CM

## 2019-08-21 DIAGNOSIS — Z7982 Long term (current) use of aspirin: Secondary | ICD-10-CM

## 2019-08-21 LAB — POCT GLYCOSYLATED HEMOGLOBIN (HGB A1C): Hemoglobin A1C: 6.5 % — AB (ref 4.0–5.6)

## 2019-08-21 LAB — GLUCOSE, CAPILLARY: Glucose-Capillary: 105 mg/dL — ABNORMAL HIGH (ref 70–99)

## 2019-08-21 MED ORDER — LISINOPRIL-HYDROCHLOROTHIAZIDE 10-12.5 MG PO TABS: 1.0000 | ORAL_TABLET | Freq: Every day | ORAL | 0 refills | Status: DC

## 2019-08-21 NOTE — Patient Instructions (Addendum)
Juan [name],  Your blood pressure is too high today. We added a new blood pressure medicine to help with this. It is callled lisinopril-hydrochlorothiaZIDE.  You will take 1 tablet every day.  You will have to come back in 4 weeks to recheck your kidneys.  Please remember to have an appointment with Dr. early tomorrow at 11:45 AM.  Please make sure to go to your kidney ultrasound appointment.  We will see you back in 4 weeks.  Please call us if you have any questions or concerns in the meantime.  - Dr. Frederico Hamman

## 2019-08-21 NOTE — Progress Notes (Signed)
   CC: Follow up for T2DM , HTN, and CKD stage 3  HPI:  [name] is a 68 y.o. year-old male with PMH listed below who presents to clinic for follow up of T2DM, HTN, and CKD. Please see problem based assessment and plan for further details.   Past Medical History:  Diagnosis Date  . Carotid artery occlusion   . Diabetes mellitus without complication (HCC)    Type II, takes metformin per patient(11/01/15)  . Hyperlipemia   . Hypertension   . Stroke [facility]) 06/2013   Left side- weak, unable to make a fist   Review of Systems:   Review of Systems  Constitutional: Negative for chills, fever and weight loss.  Respiratory: Negative for cough and shortness of breath.   Cardiovascular: Negative for chest pain, palpitations and leg swelling.  Gastrointestinal: Negative for abdominal pain, constipation, diarrhea, nausea and vomiting.  Genitourinary: Negative for frequency and urgency.  Neurological: Negative for dizziness and headaches.     Physical Exam:  Vitals:   08/21/19 1312  Weight: 149 lb 9.6 oz (67.9 kg)    General: well appearing male, well-nourished, in no acute distress  Cardiac: regular rate and rhythm, nl S1/S2, no murmurs, rubs or gallops  Pulm: CTAB, no wheezes or crackles, no increased work of breathing on room air  Ext: warm and well perfused, no peripheral edema   Assessment & Plan:   See Encounters Tab for problem based charting.  Patient discussed with Dr. Philipp Ovens

## 2019-08-22 ENCOUNTER — Other Ambulatory Visit: Payer: Self-pay

## 2019-08-22 ENCOUNTER — Encounter (HOSPITAL_COMMUNITY): Payer: PPO

## 2019-08-22 ENCOUNTER — Encounter: Payer: Self-pay | Admitting: Internal Medicine

## 2019-08-22 ENCOUNTER — Encounter: Payer: PPO | Admitting: Vascular Surgery

## 2019-08-22 DIAGNOSIS — I63239 Cerebral infarction due to unspecified occlusion or stenosis of unspecified carotid arteries: Secondary | ICD-10-CM

## 2019-08-22 DIAGNOSIS — D72829 Elevated white blood cell count, unspecified: Secondary | ICD-10-CM | POA: Insufficient documentation

## 2019-08-22 DIAGNOSIS — D721 Eosinophilia, unspecified: Secondary | ICD-10-CM | POA: Insufficient documentation

## 2019-08-22 LAB — CBC
Hematocrit: 46.7 % (ref 37.5–51.0)
Hemoglobin: 14.6 g/dL (ref 13.0–17.7)
MCH: 25.1 pg — ABNORMAL LOW (ref 26.6–33.0)
MCHC: 31.3 g/dL — ABNORMAL LOW (ref 31.5–35.7)
MCV: 80 fL (ref 79–97)
Platelets: 243 10*3/uL (ref 150–450)
RBC: 5.82 x10E6/uL — ABNORMAL HIGH (ref 4.14–5.80)
RDW: 13.7 % (ref 11.6–15.4)
WBC: 13 10*3/uL — ABNORMAL HIGH (ref 3.4–10.8)

## 2019-08-22 LAB — BMP8+ANION GAP
Anion Gap: 15 mmol/L (ref 10.0–18.0)
BUN/Creatinine Ratio: 10 (ref 10–24)
BUN: 13 mg/dL (ref 8–27)
CO2: 24 mmol/L (ref 20–29)
Calcium: 10.1 mg/dL (ref 8.6–10.2)
Chloride: 101 mmol/L (ref 96–106)
Creatinine, Ser: 1.35 mg/dL — ABNORMAL HIGH (ref 0.76–1.27)
GFR calc Af Amer: 61 mL/min/{1.73_m2} (ref >59)
GFR calc non Af Amer: 53 mL/min/{1.73_m2} — ABNORMAL LOW (ref >59)
Glucose: 108 mg/dL — ABNORMAL HIGH (ref 65–99)
Potassium: 5.5 mmol/L — ABNORMAL HIGH (ref 3.5–5.2)
Sodium: 140 mmol/L (ref 134–144)

## 2019-08-22 NOTE — Assessment & Plan Note (Signed)
Patient presents for T2DM follow up. On Synjardy 04-999 mg BID. States he does not miss any doses. A1c today 6.5 from 8.2 three months ago. Will continue Synjardy at current dose, but will have to closely monitor renal function as empagliflozin is not recommended in patients with GFR < 45 and he lives at 46-60. In addition, we may need to reduce metformin dose if renal function worsens. BMP today showed GRF of 53 so I think it is safe to continue Synjardy for now. Up to date with foot exam. Eye exam scheduled for next visit.

## 2019-08-22 NOTE — Assessment & Plan Note (Signed)
We are working on T2DM and blood pressure control as this is likely the etiology of his CKD. Please see separate A&P for management of these chronic medical conditions. Renal US ordered 1 month ago but patient did not know where to go for this. He has been scheduled to have it done on 8/27. He knows to go to radiology department. This was also discussed with his interpreter who will be accompanying him.

## 2019-08-22 NOTE — Assessment & Plan Note (Addendum)
Mild leukocytosis noted 3 years ago on CBC (WBC 1.1). Repeated CBC today to check for anemia in the setting of CKD. Hgb and platelets were normal but WBC is now 13. Patient stated yesterday he was in his usual state of health and did not report any symptoms concerning for infection. Added diff and will order peripheral blood smear for further evaluation.

## 2019-08-22 NOTE — Assessment & Plan Note (Signed)
Has not seen [facility] since 2017. We scheduled an appt for him tomorrow with Dr. Donnetta Hutching. He is compliant with ASA and statin. I was able to confirm this as he brought his medications to this appt.

## 2019-08-22 NOTE — Assessment & Plan Note (Addendum)
Patient is on Cardizem CD 120 mg QD for HTN. He brought pill bottles today and confirms he has been taking it every day at home. BP 193/68. Unclear why his BP has worsened as I did not make any medications changes during last visit 4 weeks ago when his BP was well-controlled on the same medication at the same dose. He does mention to me that he stopped taking other medications like I told him to during our last visit due to CKD. Not sure which medication he is referring to as he was not on any nephrotoxic agents then. We did discuss not taking any NSAIDs. He has been on lisinopril and  lisinopril-HCTZ  Before and I suspect he probably has been taking one of these at home until 4 weeks ago. He has done this in the past (taken both lisinopril and lisino-HCTZ together and developed an AKI).   I am unable to titrate Cardizem up as his pulse is in the low 60s already. I will add low dose lisinopril-HCTZ. He will benefit from ACEi in the setting of T2DM and microalbuminuria. I also ordered a BMP to check renal function.  He is to follow-up in 4 weeks to recheck blood pressure and renal function.   ADDENDUM: BMP with mild hyperkalemia at  5.5. Called the lab, no hemolysis noted. Will ask Hme to call patient and ask him to come back on Thursday or Friday to recheck since we started an ACEi. Renal function mildly improved from before.

## 2019-08-22 NOTE — Progress Notes (Signed)
Internal Medicine Clinic Attending  Case discussed with Dr. Santos-Sanchez at the time of the visit.  We reviewed the resident's history and exam and pertinent patient test results.  I agree with the assessment, diagnosis, and plan of care documented in the resident's note.    

## 2019-08-23 ENCOUNTER — Ambulatory Visit (HOSPITAL_COMMUNITY): Payer: PPO

## 2019-08-23 ENCOUNTER — Other Ambulatory Visit: Payer: Self-pay | Admitting: Internal Medicine

## 2019-08-23 ENCOUNTER — Other Ambulatory Visit: Payer: PPO

## 2019-08-23 DIAGNOSIS — N183 Chronic kidney disease, stage 3 unspecified: Secondary | ICD-10-CM

## 2019-08-23 DIAGNOSIS — D72829 Elevated white blood cell count, unspecified: Secondary | ICD-10-CM

## 2019-08-23 DIAGNOSIS — E875 Hyperkalemia: Secondary | ICD-10-CM

## 2019-08-23 LAB — WHITE BLOOD COUNT AND DIFFERENTIAL
Basophils Absolute: 0.1 10*3/uL (ref 0.0–0.2)
Basos: 1 %
EOS (ABSOLUTE): 4.5 10*3/uL — ABNORMAL HIGH (ref 0.0–0.4)
Eos: 35 %
Immature Grans (Abs): 0 10*3/uL (ref 0.0–0.1)
Immature Granulocytes: 0 %
Lymphocytes Absolute: 4 10*3/uL — ABNORMAL HIGH (ref 0.7–3.1)
Lymphs: 31 %
Monocytes Absolute: 0.7 10*3/uL (ref 0.1–0.9)
Monocytes: 5 %
Neutrophils Absolute: 3.7 10*3/uL (ref 1.4–7.0)
Neutrophils: 28 %
WBC: 13.1 10*3/uL — ABNORMAL HIGH (ref 3.4–10.8)

## 2019-08-23 LAB — SPECIMEN STATUS REPORT

## 2019-08-24 ENCOUNTER — Ambulatory Visit (HOSPITAL_COMMUNITY)
Admission: RE | Admit: 2019-08-24 | Discharge: 2019-08-24 | Disposition: A | Discharge status: Home / Self Care | Payer: PPO | Source: Ambulatory Visit | Attending: Internal Medicine | Admitting: Internal Medicine

## 2019-08-24 ENCOUNTER — Other Ambulatory Visit (INDEPENDENT_AMBULATORY_CARE_PROVIDER_SITE_OTHER): Payer: PPO

## 2019-08-24 ENCOUNTER — Other Ambulatory Visit: Payer: Self-pay

## 2019-08-24 DIAGNOSIS — N183 Chronic kidney disease, stage 3 unspecified: Secondary | ICD-10-CM

## 2019-08-24 DIAGNOSIS — D72829 Elevated white blood cell count, unspecified: Secondary | ICD-10-CM

## 2019-08-24 DIAGNOSIS — E875 Hyperkalemia: Secondary | ICD-10-CM

## 2019-08-24 DIAGNOSIS — R7989 Other specified abnormal findings of blood chemistry: Secondary | ICD-10-CM | POA: Diagnosis not present

## 2019-08-24 LAB — BASIC METABOLIC PANEL
Anion gap: 12 (ref 5–15)
BUN: 20 mg/dL (ref 8–23)
CO2: 27 mmol/L (ref 22–32)
Calcium: 9.7 mg/dL (ref 8.9–10.3)
Chloride: 100 mmol/L (ref 98–111)
Creatinine, Ser: 1.75 mg/dL — ABNORMAL HIGH (ref 0.61–1.24)
GFR calc Af Amer: 45 mL/min — ABNORMAL LOW (ref >60)
GFR calc non Af Amer: 39 mL/min — ABNORMAL LOW (ref >60)
Glucose, Bld: 221 mg/dL — ABNORMAL HIGH (ref 70–99)
Potassium: 4.7 mmol/L (ref 3.5–5.1)
Sodium: 139 mmol/L (ref 135–145)

## 2019-08-24 LAB — CBC WITH DIFFERENTIAL/PLATELET
Abs Immature Granulocytes: 0.09 10*3/uL — ABNORMAL HIGH (ref 0.00–0.07)
Basophils Absolute: 0.2 10*3/uL — ABNORMAL HIGH (ref 0.0–0.1)
Basophils Relative: 2 %
Eosinophils Absolute: 3.9 10*3/uL — ABNORMAL HIGH (ref 0.0–0.5)
Eosinophils Relative: 34 %
HCT: 44.6 % (ref 39.0–52.0)
Hemoglobin: 13.9 g/dL (ref 13.0–17.0)
Immature Granulocytes: 1 %
Lymphocytes Relative: 34 %
Lymphs Abs: 3.9 10*3/uL (ref 0.7–4.0)
MCH: 26.3 pg (ref 26.0–34.0)
MCHC: 31.2 g/dL (ref 30.0–36.0)
MCV: 84.5 fL (ref 80.0–100.0)
Monocytes Absolute: 0.8 10*3/uL (ref 0.1–1.0)
Monocytes Relative: 7 %
Neutro Abs: 2.5 10*3/uL (ref 1.7–7.7)
Neutrophils Relative %: 22 %
Platelets: 222 10*3/uL (ref 150–400)
RBC: 5.28 MIL/uL (ref 4.22–5.81)
RDW: 14.4 % (ref 11.5–15.5)
WBC: 11.4 10*3/uL — ABNORMAL HIGH (ref 4.0–10.5)
nRBC: 0 % (ref 0.0–0.2)

## 2019-08-24 LAB — PATHOLOGIST SMEAR REVIEW

## 2019-09-13 ENCOUNTER — Other Ambulatory Visit: Payer: Self-pay | Admitting: Internal Medicine

## 2019-09-13 DIAGNOSIS — I10 Essential (primary) hypertension: Secondary | ICD-10-CM

## 2019-09-18 ENCOUNTER — Ambulatory Visit (INDEPENDENT_AMBULATORY_CARE_PROVIDER_SITE_OTHER): Payer: PPO | Admitting: Internal Medicine

## 2019-09-18 ENCOUNTER — Encounter: Payer: Self-pay | Admitting: Internal Medicine

## 2019-09-18 ENCOUNTER — Other Ambulatory Visit: Payer: Self-pay

## 2019-09-18 VITALS — BP 136/84 | HR 74 | Temp 97.7°F | Ht 60.0 in | Wt 150.0 lb

## 2019-09-18 DIAGNOSIS — N189 Chronic kidney disease, unspecified: Secondary | ICD-10-CM | POA: Diagnosis not present

## 2019-09-18 DIAGNOSIS — Z23 Encounter for immunization: Secondary | ICD-10-CM

## 2019-09-18 DIAGNOSIS — I6529 Occlusion and stenosis of unspecified carotid artery: Secondary | ICD-10-CM | POA: Diagnosis not present

## 2019-09-18 DIAGNOSIS — I129 Hypertensive chronic kidney disease with stage 1 through stage 4 chronic kidney disease, or unspecified chronic kidney disease: Secondary | ICD-10-CM | POA: Diagnosis not present

## 2019-09-18 DIAGNOSIS — Z79899 Other long term (current) drug therapy: Secondary | ICD-10-CM | POA: Diagnosis not present

## 2019-09-18 DIAGNOSIS — N183 Chronic kidney disease, stage 3 unspecified: Secondary | ICD-10-CM

## 2019-09-18 DIAGNOSIS — I10 Essential (primary) hypertension: Secondary | ICD-10-CM | POA: Diagnosis not present

## 2019-09-18 MED ORDER — LISINOPRIL-HYDROCHLOROTHIAZIDE 10-12.5 MG PO TABS: 1.0000 | ORAL_TABLET | Freq: Every day | ORAL | 0 refills | Status: DC

## 2019-09-18 NOTE — Assessment & Plan Note (Signed)
The patient's blood pressure during this visit was . The patient is currently taking diltiazem 120mg  qd, lisinopril-hctz 10-12.5mg  qd. His last blood pressure visits are   BP Readings from Last 3 Encounters:  09/18/19 136/84  08/21/19 (!) 193/68  07/24/19 (!) 144/54   The patient does/does not  report palpitations, dizziness, chest pain, sob .  Assessment and Plan The patient's blood pressure appears to be in good control. Will continue current regimen.

## 2019-09-18 NOTE — Progress Notes (Signed)
   CC: HTN and CKD follow up  HPI:  [name] is a 68 y.o. male with dm2, carotid artery stenosis, ckd who presents for follow up of hypertension and ckd. Please see problem based charting for evaluation, assessment, and plan.  Past Medical History:  Diagnosis Date  . Carotid artery occlusion   . Diabetes mellitus without complication (HCC)    Type II, takes metformin per patient(11/01/15)  . Hyperlipemia   . Hypertension   . Stroke [facility]) 06/2013   Left side- weak, unable to make a fist   Review of Systems:    Review of Systems  Constitutional: Negative for chills and fever.  Respiratory: Negative for cough and shortness of breath.   Cardiovascular: Negative for chest pain.  Gastrointestinal: Negative for abdominal pain, nausea and vomiting.  Neurological: Negative for dizziness.  Psychiatric/Behavioral: Negative for depression.   Physical Exam:  Vitals:   09/18/19 1102  BP: 136/84  Pulse: 74  Temp: 97.7 F (36.5 C)  TempSrc: Oral  SpO2: 99%  Weight: 150 lb (68 kg)  Height: 5' (1.524 m)   Physical Exam  Constitutional: Appears well-developed and well-nourished. No distress.  HENT:  Head: Normocephalic and atraumatic.  Eyes: Conjunctivae are normal.  Cardiovascular: Normal rate, regular rhythm and normal heart sounds.  Respiratory: Effort normal and breath sounds normal. No respiratory distress. No wheezes.  GI: Soft. Bowel sounds are normal. No distension. There is no tenderness.  Musculoskeletal: No edema.  Neurological: Is alert.  Skin: Not diaphoretic. No erythema.  Psychiatric: Normal mood and affect. Behavior is normal. Judgment and thought content normal.    Assessment & Plan:   See Encounters Tab for problem based charting.  Patient discussed with Dr. Philipp Ovens

## 2019-09-18 NOTE — Patient Instructions (Addendum)
It was a pleasure to see you today Mr. Desmith. Please make the following changes:  Your blood pressure during this visit is well controlled. Please continue diltiazem and lisinopril-hctz. I have checked bloodwork today to assess your kidney function.   If you have any questions or concerns, please call our clinic at [phone] between 9am-5pm and after hours call [phone] and ask for the internal medicine resident on call. If you feel you are having a medical emergency please call 911.   Thank you, we look forward to help you remain healthy!  Lars Mage, MD Internal Medicine PGY3

## 2019-09-18 NOTE — Assessment & Plan Note (Signed)
The patient has been having worsening renal function over the past year. His baseline cr is usually between 1.1-1.4, but over the past year his creatinine has trended up as high ast 1.9. His last creatinine is 1.75 on 8/27 and gfr of 39 putting him in ckd 3b range.  Patient had a renal ultrasound done 8/27 which showed no renal mass or hydronephrosis. He did have prostate enlargement measuring upto 6.2cm.   -Will recheck bmp today

## 2019-09-19 ENCOUNTER — Encounter: Payer: PPO | Admitting: Vascular Surgery

## 2019-09-19 ENCOUNTER — Inpatient Hospital Stay (HOSPITAL_COMMUNITY): Admission: RE | Admit: 2019-09-19 | Payer: PPO | Source: Ambulatory Visit

## 2019-09-19 LAB — BMP8+ANION GAP
Anion Gap: 15 mmol/L (ref 10.0–18.0)
BUN/Creatinine Ratio: 15 (ref 10–24)
BUN: 24 mg/dL (ref 8–27)
CO2: 24 mmol/L (ref 20–29)
Calcium: 9.8 mg/dL (ref 8.6–10.2)
Chloride: 98 mmol/L (ref 96–106)
Creatinine, Ser: 1.57 mg/dL — ABNORMAL HIGH (ref 0.76–1.27)
GFR calc Af Amer: 51 mL/min/{1.73_m2} — ABNORMAL LOW (ref >59)
GFR calc non Af Amer: 44 mL/min/{1.73_m2} — ABNORMAL LOW (ref >59)
Glucose: 128 mg/dL — ABNORMAL HIGH (ref 65–99)
Potassium: 4.8 mmol/L (ref 3.5–5.2)
Sodium: 137 mmol/L (ref 134–144)

## 2019-09-19 NOTE — Progress Notes (Signed)
Internal Medicine Clinic Attending  Case discussed with Dr. Chundi at the time of the visit.  We reviewed the resident's history and exam and pertinent patient test results.  I agree with the assessment, diagnosis, and plan of care documented in the resident's note. 

## 2019-10-09 ENCOUNTER — Telehealth (HOSPITAL_COMMUNITY): Payer: Self-pay

## 2019-10-09 NOTE — Telephone Encounter (Signed)

## 2019-10-10 ENCOUNTER — Encounter: Payer: PPO | Admitting: Vascular Surgery

## 2019-10-10 ENCOUNTER — Ambulatory Visit (HOSPITAL_COMMUNITY): Admission: RE | Admit: 2019-10-10 | Payer: PPO | Source: Ambulatory Visit

## 2019-10-10 NOTE — Addendum Note (Signed)
Addended by: Hulan Fray on: 10/10/2019 05:47 PM   Modules accepted: Orders

## 2019-10-11 ENCOUNTER — Other Ambulatory Visit: Payer: Self-pay

## 2019-10-11 ENCOUNTER — Ambulatory Visit (INDEPENDENT_AMBULATORY_CARE_PROVIDER_SITE_OTHER): Payer: PPO | Admitting: Cardiovascular Disease

## 2019-10-11 ENCOUNTER — Encounter: Payer: Self-pay | Admitting: Cardiovascular Disease

## 2019-10-11 DIAGNOSIS — I10 Essential (primary) hypertension: Secondary | ICD-10-CM

## 2019-10-11 MED ORDER — LISINOPRIL-HYDROCHLOROTHIAZIDE 10-12.5 MG PO TABS: 1.0000 | ORAL_TABLET | Freq: Every day | ORAL | 1 refills | Status: DC

## 2019-10-11 NOTE — Patient Instructions (Addendum)
Medication Instructions:  Your physician recommends that you continue on your current medications as directed. Please refer to the Current Medication list given to you today.    Labwork: NONE   Testing/Procedures: NONE   Follow-Up: Your physician recommends that you schedule a follow-up appointment in: 11/06/2019 AT 10:30 AM    You will receive a phone call from the PREP exercise and nutrition program to schedule an initial assessment.   Special Instructions:   MONITOR  YOUR BLOOD PRESSURE AT HOME TWICE A DAY LOG ON SHEET GIVEN TO YOU   If you have questions regarding your blood pressure or any medication changes from Monday through Friday, 8 AM through 5 PM please call Vanita Ingles, RN [phone]) or Winifred Olive, RN [phone]).  If you are in need of emergency care after hours, please call 911 or be seen in the emergency room.   Care Guide: Neil Crouch  Mychart or Email: [email or web address]   DASH Eating Plan DASH stands for "Dietary Approaches to Stop Hypertension." The DASH eating plan is a healthy eating plan that has been shown to reduce high blood pressure (hypertension). It may also reduce your risk for type 2 diabetes, heart disease, and stroke. The DASH eating plan may also help with weight loss. What are tips for following this plan?  General guidelines  Avoid eating more than 2,300 mg (milligrams) of salt (sodium) a day. If you have hypertension, you may need to reduce your sodium intake to 1,500 mg a day.  Limit alcohol intake to no more than 1 drink a day for nonpregnant women and 2 drinks a day for men. One drink equals 12 oz of beer, 5 oz of wine, or 1 oz of hard liquor.  Work with your health care provider to maintain a healthy body weight or to lose weight. Ask what an ideal weight is for you.  Get at least 30 minutes of exercise that causes your heart to beat faster (aerobic exercise) most days of the week. Activities may include walking,  swimming, or biking.  Work with your health care provider or diet and nutrition specialist (dietitian) to adjust your eating plan to your individual calorie needs. Reading food labels   Check food labels for the amount of sodium per serving. Choose foods with less than 5 percent of the Daily Value of sodium. Generally, foods with less than 300 mg of sodium per serving fit into this eating plan.  To find whole grains, look for the word "whole" as the first word in the ingredient list. Shopping  Buy products labeled as "low-sodium" or "no salt added."  Buy fresh foods. Avoid canned foods and premade or frozen meals. Cooking  Avoid adding salt when cooking. Use salt-free seasonings or herbs instead of table salt or sea salt. Check with your health care provider or pharmacist before using salt substitutes.  Do not fry foods. Cook foods using healthy methods such as baking, boiling, grilling, and broiling instead.  Cook with heart-healthy oils, such as olive, canola, soybean, or sunflower oil. Meal planning  Eat a balanced diet that includes: ? 5 or more servings of fruits and vegetables each day. At each meal, try to fill half of your plate with fruits and vegetables. ? Up to 6-8 servings of whole grains each day. ? Less than 6 oz of lean meat, poultry, or fish each day. A 3-oz serving of meat is about the same size as a deck of cards. One egg equals 1 oz. ? 2  servings of low-fat dairy each day. ? A serving of nuts, seeds, or beans 5 times each week. ? Heart-healthy fats. Healthy fats called Omega-3 fatty acids are found in foods such as flaxseeds and coldwater fish, like sardines, salmon, and mackerel.  Limit how much you eat of the following: ? Canned or prepackaged foods. ? Food that is high in trans fat, such as fried foods. ? Food that is high in saturated fat, such as fatty meat. ? Sweets, desserts, sugary drinks, and other foods with added sugar. ? Full-fat dairy products.   Do not salt foods before eating.  Try to eat at least 2 vegetarian meals each week.  Eat more home-cooked food and less restaurant, buffet, and fast food.  When eating at a restaurant, ask that your food be prepared with less salt or no salt, if possible. What foods are recommended? The items listed may not be a complete list. Talk with your dietitian about what dietary choices are best for you. Grains Whole-grain or whole-wheat bread. Whole-grain or whole-wheat pasta. Brown rice. Orpah Cobb. Bulgur. Whole-grain and low-sodium cereals. Pita bread. Low-fat, low-sodium crackers. Whole-wheat flour tortillas. Vegetables Fresh or frozen vegetables (raw, steamed, roasted, or grilled). Low-sodium or reduced-sodium tomato and vegetable juice. Low-sodium or reduced-sodium tomato sauce and tomato paste. Low-sodium or reduced-sodium canned vegetables. Fruits All fresh, dried, or frozen fruit. Canned fruit in natural juice (without added sugar). Meat and other protein foods Skinless chicken or [place]. Ground chicken or [place]. Pork with fat trimmed off. Fish and seafood. Egg whites. Dried beans, peas, or lentils. Unsalted nuts, nut butters, and seeds. Unsalted canned beans. Lean cuts of beef with fat trimmed off. Low-sodium, lean deli meat. Dairy Low-fat (1%) or fat-free (skim) milk. Fat-free, low-fat, or reduced-fat cheeses. Nonfat, low-sodium ricotta or cottage cheese. Low-fat or nonfat yogurt. Low-fat, low-sodium cheese. Fats and oils Soft margarine without trans fats. Vegetable oil. Low-fat, reduced-fat, or light mayonnaise and salad dressings (reduced-sodium). Canola, safflower, olive, soybean, and sunflower oils. Avocado. Seasoning and other foods Herbs. Spices. Seasoning mixes without salt. Unsalted popcorn and pretzels. Fat-free sweets. What foods are not recommended? The items listed may not be a complete list. Talk with your dietitian about what dietary choices are best for you. Grains  Baked goods made with fat, such as croissants, muffins, or some breads. Dry pasta or rice meal packs. Vegetables Creamed or fried vegetables. Vegetables in a cheese sauce. Regular canned vegetables (not low-sodium or reduced-sodium). Regular canned tomato sauce and paste (not low-sodium or reduced-sodium). Regular tomato and vegetable juice (not low-sodium or reduced-sodium). Rosita Fire. Olives. Fruits Canned fruit in a light or heavy syrup. Fried fruit. Fruit in cream or butter sauce. Meat and other protein foods Fatty cuts of meat. Ribs. Fried meat. Tomasa Blase. Sausage. Bologna and other processed lunch meats. Salami. Fatback. Hotdogs. Bratwurst. Salted nuts and seeds. Canned beans with added salt. Canned or smoked fish. Whole eggs or egg yolks. Chicken or [place] with skin. Dairy Whole or 2% milk, cream, and half-and-half. Whole or full-fat cream cheese. Whole-fat or sweetened yogurt. Full-fat cheese. Nondairy creamers. Whipped toppings. Processed cheese and cheese spreads. Fats and oils Butter. Stick margarine. Lard. Shortening. Ghee. Bacon fat. Tropical oils, such as coconut, palm kernel, or palm oil. Seasoning and other foods Salted popcorn and pretzels. Onion salt, garlic salt, seasoned salt, table salt, and sea salt. Worcestershire sauce. Tartar sauce. Barbecue sauce. Teriyaki sauce. Soy sauce, including reduced-sodium. Steak sauce. Canned and packaged gravies. Fish sauce. Oyster sauce. Cocktail sauce. Horseradish  that you find on the shelf. Ketchup. Mustard. Meat flavorings and tenderizers. Bouillon cubes. Hot sauce and Tabasco sauce. Premade or packaged marinades. Premade or packaged taco seasonings. Relishes. Regular salad dressings. Where to find more information:  National Heart, Lung, and Blood Institute: PopSteam.iswww.nhlbi.nih.gov  American Heart Association: www.heart.org Summary  The DASH eating plan is a healthy eating plan that has been shown to reduce high blood pressure (hypertension). It may  also reduce your risk for type 2 diabetes, heart disease, and stroke.  With the DASH eating plan, you should limit salt (sodium) intake to 2,300 mg a day. If you have hypertension, you may need to reduce your sodium intake to 1,500 mg a day.  When on the DASH eating plan, aim to eat more fresh fruits and vegetables, whole grains, lean proteins, low-fat dairy, and heart-healthy fats.  Work with your health care provider or diet and nutrition specialist (dietitian) to adjust your eating plan to your individual calorie needs. This information is not intended to replace advice given to you by your health care provider. Make sure you discuss any questions you have with your health care provider. Document Released: 12/03/2011 Document Revised: 11/26/2017 Document Reviewed: 12/07/2016 Elsevier Patient Education  2020 [employer]

## 2019-10-11 NOTE — Progress Notes (Signed)
   Hypertension Clinic Care Guide Assessment:    Date:  10/11/2019   ID:  Juan [name], DOB [birth year], MRN [id number]  PCP:  Juan Roche, MD  Cardiologist:  No primary care provider on file.   Referring MD: Juan [name],*    Patient Identification:     History of Present Illness/Progress Notes:    Current Medications: Current Meds  Medication Sig  . aspirin 81 MG EC tablet Take 1 tablet (81 mg total) by mouth daily.  Marland Kitchen atorvastatin (LIPITOR) 80 MG tablet Take 1 tablet (80 mg total) by mouth daily.  Marland Kitchen diltiazem (CARDIZEM CD) 120 MG 24 hr capsule Take 1 capsule (120 mg total) by mouth daily.  Marland Kitchen lisinopril-hydrochlorothiazide (ZESTORETIC) 10-12.5 MG tablet Take 1 tablet by mouth daily.  Marland Kitchen SYNJARDY 04-999 MG TABS TAKE 1 TABLET BY MOUTH 2 (TWO) TIMES DAILY WITH A MEAL.     Social History: Social History   Socioeconomic History  . Marital status: Married    Spouse name: Not on file  . Number of children: 4  . Years of education: 0  . Highest education level: 6th grade  Occupational History  . Occupation: Retired  Scientific laboratory technician  . Financial resource strain: Not hard at all  . Food insecurity    Worry: Never true    Inability: Never true  . Transportation needs    Medical: No    Non-medical: No  Tobacco Use  . Smoking status: Former Smoker    Years: 50.00    Quit date: 12/28/2005    Years since quitting: 13.7  . Smokeless tobacco: Former Network engineer and Sexual Activity  . Alcohol use: No    Alcohol/week: 0.0 standard drinks    Comment: a few drinks occasionally   . Drug use: No  . Sexual activity: Not on file  Lifestyle  . Physical activity    Days per week: 0 days    Minutes per session: 0 min  . Stress: Not at all  Relationships  . Social Herbalist on phone: Not on file    Gets together: Not on file    Attends religious service: Not on file    Active member of club or organization: Not on file    Attends meetings of clubs  or organizations: Not on file    Relationship status: Not on file  Other Topics Concern  . Not on file  Social History Narrative   Lives at home with wife, son, and grandchildren.      Challenges:   None presented at this time.  Opportunities:   Pt mentions no issues with diet. Does walk his steps, but not enough to break a sweat. Enough to get steps in. Discussed details and benefits of PREP program.  Patient Commitment/Agreement for Next Session:   No agreements set at this time.   Care Guide/Social Work Interventions   None at this time  Follow up:       Juan [name]  10/11/2019 9:25 AM    [place]

## 2019-10-11 NOTE — Progress Notes (Signed)
Hypertension Clinic Initial Assessment:    Date:  10/11/2019   ID:  Juan [name], DOB March 07, 1951, MRN [id number]  PCP:  Burna Cash, MD  Cardiologist:  No primary care provider on file.  Nephrologist:  Referring MD: Burna Cash,*   CC: Hypertension  History of Present Illness:    Juan [name] is a 68 y.o. male with a hx of diabetes, hypertension, stroke, hyperlipidemia, carotid stenosis s/p CEA and CKD 3 here to establish care in the hypertension clinic.  He was first diagnosed with hypertension in 2014 at the time of his stroke.  Since then he has been struggling with his blood pressure.  He was last seen in the resident clinic 09/18/2019.  At that time his blood pressure was much better controlled, 136/84.  The last preceding blood pressure was 193/68.  I have also been working on his chronic kidney disease.  He had a renal ultrasound 08/24/2019 that revealed no hydronephrosis and prostate enlargement.  Arterial Dopplers were not performed.  He had an exercise Myoview 06/2013 that revealed LVEF 51% and no ischemia.   Overall Mr. Natter has been feeling well.  He does not get formal exercise.  He walks around his home once per week.  He has no exertional chest pain or shortness of breath with this activity.  He denies any lower extremity edema, orthopnea, or PND.  He is interested in trying to exercise more.  He denies any palpitations, lightheadedness, or dizziness   Previous antihypertensives: He is unsure of the names but knows that he stopped something due to his renal function.   Past Medical History:  Diagnosis Date  . Carotid artery occlusion   . Diabetes mellitus without complication (HCC)    Type II, takes metformin per patient(11/01/15)  . Hyperlipemia   . Hypertension   . Stroke [facility]) 06/2013   Left side- weak, unable to make a fist    Past Surgical History:  Procedure Laterality Date  . CAROTID ENDARTERECTOMY Left 08-04-13  . ENDARTERECTOMY Left 08/04/2013    Procedure: Left Carotid Endarterectomy with hemashield patch angioplasty;  Surgeon: Larina Earthly, MD;  Location: [facility] OR;  Service: Vascular;  Laterality: Left;  . NECK SURGERY  2003   Cord Compression  . TEE WITHOUT CARDIOVERSION N/A 07/20/2013   Procedure: TRANSESOPHAGEAL ECHOCARDIOGRAM (TEE);  Surgeon: Wendall Stade, MD;  Location: [facility] ENDOSCOPY;  Service: Cardiovascular;  Laterality: N/A;    Current Medications: Current Meds  Medication Sig  . aspirin 81 MG EC tablet Take 1 tablet (81 mg total) by mouth daily.  Marland Kitchen atorvastatin (LIPITOR) 80 MG tablet Take 1 tablet (80 mg total) by mouth daily.  Marland Kitchen diltiazem (CARDIZEM CD) 120 MG 24 hr capsule Take 1 capsule (120 mg total) by mouth daily.  Marland Kitchen lisinopril-hydrochlorothiazide (ZESTORETIC) 10-12.5 MG tablet Take 1 tablet by mouth daily.  Marland Kitchen SYNJARDY 04-999 MG TABS TAKE 1 TABLET BY MOUTH 2 (TWO) TIMES DAILY WITH A MEAL.  . [DISCONTINUED] lisinopril-hydrochlorothiazide (ZESTORETIC) 10-12.5 MG tablet Take 1 tablet by mouth daily.     Allergies:   Patient has no known allergies.   Social History   Socioeconomic History  . Marital status: Married    Spouse name: Not on file  . Number of children: 4  . Years of education: 0  . Highest education level: 6th grade  Occupational History  . Occupation: Retired  Engineer, production  . Financial resource strain: Not hard at all  . Food insecurity    Worry: Never true  Inability: Never true  . Transportation needs    Medical: No    Non-medical: No  Tobacco Use  . Smoking status: Former Smoker    Years: 50.00    Quit date: 12/28/2005    Years since quitting: 13.7  . Smokeless tobacco: Former Engineer, waterUser  Substance and Sexual Activity  . Alcohol use: No    Alcohol/week: 0.0 standard drinks    Comment: a few drinks occasionally   . Drug use: No  . Sexual activity: Not on file  Lifestyle  . Physical activity    Days per week: 0 days    Minutes per session: 0 min  . Stress: Not at all  Relationships   . Social Musicianconnections    Talks on phone: Not on file    Gets together: Not on file    Attends religious service: Not on file    Active member of club or organization: Not on file    Attends meetings of clubs or organizations: Not on file    Relationship status: Not on file  Other Topics Concern  . Not on file  Social History Narrative   Lives at home with wife, son, and grandchildren.     Family History: The patient's family history is not on file.  ROS:   Please see the history of present illness.     All other systems reviewed and are negative.  EKGs/Labs/Other Studies Reviewed:    EKG:  EKG is 10/11/19 ordered today.  The ekg ordered today demonstrates sinus rhythm.  Rate 67 bpm.  RBBB.  PVCs.  Recent Labs: 08/24/2019: Hemoglobin 13.9; Platelets 222 09/18/2019: BUN 24; Creatinine, Ser 1.57; Potassium 4.8; Sodium 137   Recent Lipid Panel    Component Value Date/Time   CHOL 154 01/06/2019 1429   TRIG 297 (H) 01/06/2019 1429   HDL 39 (L) 01/06/2019 1429   CHOLHDL 3.9 01/06/2019 1429   CHOLHDL 4.3 10/25/2014 0848   VLDL 36 10/25/2014 0848   LDLCALC 56 01/06/2019 1429   LDLDIRECT 157 (H) 05/12/2016 1045    Physical Exam:    VS:  BP 126/72   Pulse 67   Wt 147 lb 6.4 oz (66.9 kg)   BMI 28.79 kg/m     Wt Readings from Last 3 Encounters:  10/11/19 147 lb 6.4 oz (66.9 kg)  09/18/19 150 lb (68 kg)  08/21/19 149 lb 9.6 oz (67.9 kg)    VS:  BP 126/72   Pulse 67   Wt 147 lb 6.4 oz (66.9 kg)   BMI 28.79 kg/m  , BMI Body mass index is 28.79 kg/m. GENERAL:  Well appearing HEENT: Pupils equal round and reactive, fundi not visualized, oral mucosa unremarkable NECK:  No jugular venous distention, waveform within normal limits, carotid upstroke brisk and symmetric, no bruits LUNGS:  Clear to auscultation bilaterally HEART:  RRR.  PMI not displaced or sustained,S1 and S2 within normal limits, no S3, no S4, no clicks, no rubs, no murmurs ABD:  Flat, positive bowel sounds  normal in frequency in pitch, no bruits, no rebound, no guarding, no midline pulsatile mass, no hepatomegaly, no splenomegaly EXT:  2 plus pulses throughout, no edema, no cyanosis no clubbing SKIN:  No rashes no nodules NEURO:  Cranial nerves II through XII grossly intact, motor grossly intact throughout PSYCH:  Cognitively intact, oriented to person place and time   ASSESSMENT:    1. Essential hypertension     PLAN:    # Essential hypertension:  Mr. Jaci Standardie's blood  pressure is well controlled today.  Therefore we will not make any changes.  Continue lisinopril, hydrochlorothiazide, and diltiazem.  He will also be enrolled in the PREP program through the [facility] where he will get exercise and nutrition coaching.  he has been assessed for SDOH needs which will be addressed by our Care Guide and Social Work team as needed.      Disposition:    FU with MD/PharmD in 1 month    Medication Adjustments/Labs and Tests Ordered: Current medicines are reviewed at length with the patient today.  Concerns regarding medicines are outlined above.  No orders of the defined types were placed in this encounter.  Meds ordered this encounter  Medications  . lisinopril-hydrochlorothiazide (ZESTORETIC) 10-12.5 MG tablet    Sig: Take 1 tablet by mouth daily.    Dispense:  90 tablet    Refill:  1     Signed, Skeet Latch, MD  10/11/2019 9:54 AM    [place]

## 2019-10-29 ENCOUNTER — Other Ambulatory Visit: Payer: Self-pay | Admitting: Internal Medicine

## 2019-10-29 DIAGNOSIS — E1169 Type 2 diabetes mellitus with other specified complication: Secondary | ICD-10-CM

## 2019-10-29 DIAGNOSIS — E785 Hyperlipidemia, unspecified: Secondary | ICD-10-CM

## 2019-10-31 ENCOUNTER — Telehealth: Payer: Self-pay

## 2019-10-31 NOTE — Progress Notes (Signed)
Mr Lant has barriers to understanding as English is not his first/preferred language.  One example of this was him showing up at the [facility] clinic for his intake appointment that was supposed to be at the [facility].  The Juan Quam address and appt time was given to him and reiterated w/him stating he understood but still ended up at the wrong location. He does seem to be ready for the program but doesn't speak much. Mr. Hopfensperger has Jeannene Patella, RN and my work phone numbers and has been encouraged to call prn.

## 2019-11-06 ENCOUNTER — Telehealth: Payer: PPO | Admitting: Pharmacist Clinician (PhC)/ Clinical Pharmacy Specialist

## 2019-11-06 ENCOUNTER — Encounter: Payer: Self-pay | Admitting: Pharmacist Clinician (PhC)/ Clinical Pharmacy Specialist

## 2019-11-06 NOTE — Progress Notes (Unsigned)
Hypertension Clinic Initial Assessment:    Date:  11/06/2019   ID:  Juan [name], DOB 07/03/1951, MRN [id number]  PCP:  Juan [name], Idalys, MD  Cardiologist:  No primary care provider on file.  Nephrologist:  Referring MD: Juan [name], Juan [name],*   CC: Hypertension  History of Present Illness:    Juan [name] is a 68 y.o. male with a hx of diabetes, hypertension, stroke, hyperlipidemia, carotid stenosis s/p CEA and CKD 3 here to establish care in the hypertension clinic.  He was first diagnosed with hypertension in 2014 at the time of his stroke.  Since then he has been struggling with his blood pressure.  He was last seen in the resident clinic 09/18/2019.  At that time his blood pressure was much better controlled, 136/84.  The last preceding blood pressure was 193/68.  I have also been working on his chronic kidney disease.  He had a renal ultrasound 08/24/2019 that revealed no hydronephrosis and prostate enlargement.  Arterial Dopplers were not performed.  He had an exercise Myoview 06/2013 that revealed LVEF 51% and no ischemia.   Overall Mr. Juan [name] has been feeling well.  He does not get formal exercise.  He walks around his home once per week.  He has no exertional chest pain or shortness of breath with this activity.  He denies any lower extremity edema, orthopnea, or PND.  He is interested in trying to exercise more.  He denies any palpitations, lightheadedness, or dizziness   Previous antihypertensives: He is unsure of the names but knows that he stopped something due to his renal function.   Past Medical History:  Diagnosis Date  . Carotid artery occlusion   . Diabetes mellitus without complication (HCC)    Type II, takes metformin per patient(11/01/15)  . Hyperlipemia   . Hypertension   . Stroke [facility](HCC) 06/2013   Left side- weak, unable to make a fist    Past Surgical History:  Procedure Laterality Date  . CAROTID ENDARTERECTOMY Left 08-04-13  . ENDARTERECTOMY Left 08/04/2013   Procedure: Left Carotid Endarterectomy with hemashield patch angioplasty;  Surgeon: Juan Earthlyodd F Early, MD;  [facility];  Service: Vascular;  Laterality: Left;  . NECK SURGERY  2003   Cord Compression  . TEE WITHOUT CARDIOVERSION N/A 07/20/2013   Procedure: TRANSESOPHAGEAL ECHOCARDIOGRAM (TEE);  Surgeon: Juan StadePeter C Nishan, MD;  [facility];  Service: Cardiovascular;  Laterality: N/A;    Current Medications: No outpatient medications have been marked as taking for the 11/06/19 encounter (Appointment) with Juan [name], Juan [name] L, RPH-CPP.     Allergies:   Patient has no known allergies.   Social History   Socioeconomic History  . Marital status: Married    Spouse name: Not on file  . Number of children: 4  . Years of education: 0  . Highest education level: 6th grade  Occupational History  . Occupation: Retired  Engineer, productionocial Needs  . Financial resource strain: Not hard at all  . Food insecurity    Worry: Never true    Inability: Never true  . Transportation needs    Medical: No    Non-medical: No  Tobacco Use  . Smoking status: Former Smoker    Years: 50.00    Quit date: 12/28/2005    Years since quitting: 13.8  . Smokeless tobacco: Former Engineer, waterUser  Substance and Sexual Activity  . Alcohol use: No    Alcohol/week: 0.0 standard drinks    Comment: a few drinks occasionally   . Drug use: No  .  Sexual activity: Not on file  Lifestyle  . Physical activity    Days per week: 0 days    Minutes per session: 0 min  . Stress: Not at all  Relationships  . Social Herbalist on phone: Not on file    Gets together: Not on file    Attends religious service: Not on file    Active member of club or organization: Not on file    Attends meetings of clubs or organizations: Not on file    Relationship status: Not on file  Other Topics Concern  . Not on file  Social History Narrative   Lives at home with wife, son, and grandchildren.     Family History: The patient's family  history is not on file.  ROS:   Please see the history of present illness.     All other systems reviewed and are negative.  EKGs/Labs/Other Studies Reviewed:    EKG:  EKG is 10/11/19 ordered today.  The ekg ordered today demonstrates sinus rhythm.  Rate 67 bpm.  RBBB.  PVCs.  Recent Labs: 08/24/2019: Hemoglobin 13.9; Platelets 222 09/18/2019: BUN 24; Creatinine, Ser 1.57; Potassium 4.8; Sodium 137   Recent Lipid Panel    Component Value Date/Time   CHOL 154 01/06/2019 1429   TRIG 297 (H) 01/06/2019 1429   HDL 39 (L) 01/06/2019 1429   CHOLHDL 3.9 01/06/2019 1429   CHOLHDL 4.3 10/25/2014 0848   VLDL 36 10/25/2014 0848   LDLCALC 56 01/06/2019 1429   LDLDIRECT 157 (H) 05/12/2016 1045    Physical Exam:    VS:  There were no vitals taken for this visit.    Wt Readings from Last 3 Encounters:  10/19/19 145 lb 12.8 oz (66.1 kg)  10/11/19 147 lb 6.4 oz (66.9 kg)  09/18/19 150 lb (68 kg)    VS:  There were no vitals taken for this visit. , BMI There is no height or weight on file to calculate BMI. GENERAL:  Well appearing HEENT: Pupils equal round and reactive, fundi not visualized, oral mucosa unremarkable NECK:  No jugular venous distention, waveform within normal limits, carotid upstroke brisk and symmetric, no bruits LUNGS:  Clear to auscultation bilaterally HEART:  RRR.  PMI not displaced or sustained,S1 and S2 within normal limits, no S3, no S4, no clicks, no rubs, no murmurs ABD:  Flat, positive bowel sounds normal in frequency in pitch, no bruits, no rebound, no guarding, no midline pulsatile mass, no hepatomegaly, no splenomegaly EXT:  2 plus pulses throughout, no edema, no cyanosis no clubbing SKIN:  No rashes no nodules NEURO:  Cranial nerves II through XII grossly intact, motor grossly intact throughout PSYCH:  Cognitively intact, oriented to person place and time   ASSESSMENT:    No diagnosis found.  PLAN:    # Essential hypertension:  Juan [name] blood  pressure is well controlled today.  Therefore we will not make any changes.  Continue lisinopril, hydrochlorothiazide, and diltiazem.  He will also be enrolled in the PREP program through the [facility] where he will get exercise and nutrition coaching.  he has been assessed for SDOH needs which will be addressed by our Care Guide and Social Work team as needed.      Disposition:    FU with MD/PharmD in 1 month    Medication Adjustments/Labs and Tests Ordered: Current medicines are reviewed at length with the patient today.  Concerns regarding medicines are outlined above.  No orders of the  defined types were placed in this encounter.  No orders of the defined types were placed in this encounter.    Kirk Ruths, RPH-CPP  11/06/2019 7:16 AM    [facility]

## 2019-11-07 ENCOUNTER — Ambulatory Visit: Payer: PPO

## 2019-11-07 ENCOUNTER — Telehealth: Payer: Self-pay

## 2019-11-24 NOTE — Telephone Encounter (Signed)
Called Juan [name] to check on him since he didn't come to PREP class.  He didn't answer phone, so VM left.

## 2019-11-24 NOTE — Telephone Encounter (Signed)
Mr. Oehlert didn't come to PREP class.  Called but he didn't answer phone.  VM left asking him to call back.

## 2019-11-24 NOTE — Progress Notes (Signed)
Juan [name] was a No Show at PREP class today.  Assuming he will not be attending as he has missed every class other than the initial one.

## 2019-11-24 NOTE — Progress Notes (Signed)
Late entry: from 10/26/19: Called Juan [name] because hemissed class.  He said he didn't feel well.  Reminded him we'd have class again next week, Tues.  He said he would be there.

## 2019-12-12 ENCOUNTER — Other Ambulatory Visit: Payer: Self-pay | Admitting: Internal Medicine

## 2019-12-12 DIAGNOSIS — I10 Essential (primary) hypertension: Secondary | ICD-10-CM

## 2019-12-12 NOTE — Telephone Encounter (Signed)
Appt has been sch for 01/28/2019 @ 2:15 pm with his PCP.

## 2020-01-29 ENCOUNTER — Encounter: Payer: PPO | Admitting: Internal Medicine

## 2020-02-05 ENCOUNTER — Encounter: Payer: Self-pay | Admitting: Internal Medicine

## 2020-02-05 ENCOUNTER — Ambulatory Visit (INDEPENDENT_AMBULATORY_CARE_PROVIDER_SITE_OTHER): Payer: PPO | Admitting: Internal Medicine

## 2020-02-05 VITALS — BP 138/58 | HR 60 | Temp 98.4°F | Wt 151.4 lb

## 2020-02-05 DIAGNOSIS — E1151 Type 2 diabetes mellitus with diabetic peripheral angiopathy without gangrene: Secondary | ICD-10-CM

## 2020-02-05 DIAGNOSIS — I129 Hypertensive chronic kidney disease with stage 1 through stage 4 chronic kidney disease, or unspecified chronic kidney disease: Secondary | ICD-10-CM | POA: Diagnosis not present

## 2020-02-05 DIAGNOSIS — D721 Eosinophilia, unspecified: Secondary | ICD-10-CM

## 2020-02-05 DIAGNOSIS — E1122 Type 2 diabetes mellitus with diabetic chronic kidney disease: Secondary | ICD-10-CM

## 2020-02-05 DIAGNOSIS — Z8673 Personal history of transient ischemic attack (TIA), and cerebral infarction without residual deficits: Secondary | ICD-10-CM

## 2020-02-05 DIAGNOSIS — N1832 Chronic kidney disease, stage 3b: Secondary | ICD-10-CM | POA: Diagnosis not present

## 2020-02-05 DIAGNOSIS — Z7984 Long term (current) use of oral hypoglycemic drugs: Secondary | ICD-10-CM | POA: Diagnosis not present

## 2020-02-05 DIAGNOSIS — D7219 Other eosinophilia: Secondary | ICD-10-CM | POA: Diagnosis not present

## 2020-02-05 DIAGNOSIS — I10 Essential (primary) hypertension: Secondary | ICD-10-CM

## 2020-02-05 LAB — POCT GLYCOSYLATED HEMOGLOBIN (HGB A1C): Hemoglobin A1C: 8.1 % — AB (ref 4.0–5.6)

## 2020-02-05 LAB — GLUCOSE, CAPILLARY: Glucose-Capillary: 179 mg/dL — ABNORMAL HIGH (ref 70–99)

## 2020-02-05 MED ORDER — SYNJARDY 5-1000 MG PO TABS: 1.0000 | ORAL_TABLET | Freq: Every day | ORAL | 1 refills | Status: DC

## 2020-02-05 NOTE — Assessment & Plan Note (Signed)
I suspect this is secondary to uncontrolled T2DM and HTN. However, he has had a persistent eosinophilia that tracks with worsening renal function. HES in the differential though it does commonly affect skin, heart/lungs, GI tract, and neurological function. Will repeat CBC w/ diff and BMP to ensure renal function is stable.  - Follow up BMP - If renal function continues to worsen will refer to nephrology as he is already stage 3b

## 2020-02-05 NOTE — Assessment & Plan Note (Signed)
BP at goal on diltiazem 120 mg daily and lisinopril-HCTZ 10-12.5 mg daily.  He is adherent. We will continue current regimen provided renal function remained stable.

## 2020-02-05 NOTE — Progress Notes (Signed)
   CC: HTN and DM follow up, eosinophilia  HPI:  [name] is a 69 y.o. year-old male with PMH listed below who presents to clinic for HTN and T2DM follow up, eosinophilia. Please see problem based assessment and plan for further details.   Past Medical History:  Diagnosis Date  . Carotid artery occlusion   . Diabetes mellitus without complication (HCC)    Type II, takes metformin per patient(11/01/15)  . Hyperlipemia   . Hypertension   . Stroke [facility]) 06/2013   Left side- weak, unable to make a fist   Review of Systems:   Review of Systems  Constitutional: Negative for chills, fever, malaise/fatigue and weight loss.  Respiratory: Negative for cough and shortness of breath.   Cardiovascular: Negative for chest pain, orthopnea, leg swelling and PND.  Gastrointestinal: Negative for abdominal pain, blood in stool, constipation, diarrhea, melena, nausea and vomiting.  Musculoskeletal: Negative for joint pain and myalgias.  Skin: Negative for itching and rash.  Neurological: Negative for dizziness, sensory change, focal weakness and headaches.    Physical Exam:  Vitals:   02/05/20 1053  BP: (!) 138/58  Pulse: 60  Temp: 98.4 F (36.9 C)  TempSrc: Oral  Weight: 151 lb 6.4 oz (68.7 kg)    General: Well-appearing male in no acute distress Cardiac: regular rate and rhythm, nl S1/S2, no murmurs, rubs or gallops  Pulm: CTAB, no wheezes or crackles, no increased work of breathing on room air  Neuro: A&Ox3, CN II-XII intact, motor strength and sensation intact  Ext: warm and well perfused, no peripheral edema  Derm: no rashes or lesions noted    Assessment & Plan:   See Encounters Tab for problem based charting.  Patient discussed with Dr. Sandre Kitty

## 2020-02-05 NOTE — Patient Instructions (Addendum)
Mr. Salais,   Continue taking all your medications as usual. I refilled your Synjardy.   We also did blood work today to check on your kidneys and liver. Will call you with the results.   Make a follow up appointment with me in 3 months. Call us if you have any questions or concerns.   - Dr. Evelene Croon

## 2020-02-05 NOTE — Assessment & Plan Note (Signed)
This remains well controlled on Synjardy 04-999 mg daily.  We will continue current regimen. Up to date on foot exam. Will refer to ophthalmology for eye exam, we have not been able to perform it here due to language barrier.

## 2020-02-06 ENCOUNTER — Other Ambulatory Visit: Payer: Self-pay

## 2020-02-06 ENCOUNTER — Other Ambulatory Visit (HOSPITAL_COMMUNITY): Payer: Self-pay

## 2020-02-06 ENCOUNTER — Emergency Department (HOSPITAL_COMMUNITY): Payer: PPO

## 2020-02-06 ENCOUNTER — Inpatient Hospital Stay (HOSPITAL_COMMUNITY)
Admission: EM | Admit: 2020-02-06 | Discharge: 2020-02-08 | DRG: 243 | Disposition: A | Discharge status: Home / Self Care | Payer: PPO | Attending: Internal Medicine | Admitting: Internal Medicine

## 2020-02-06 DIAGNOSIS — I4510 Unspecified right bundle-branch block: Secondary | ICD-10-CM | POA: Diagnosis present

## 2020-02-06 DIAGNOSIS — Z950 Presence of cardiac pacemaker: Secondary | ICD-10-CM

## 2020-02-06 DIAGNOSIS — N183 Chronic kidney disease, stage 3 unspecified: Secondary | ICD-10-CM | POA: Diagnosis present

## 2020-02-06 DIAGNOSIS — I493 Ventricular premature depolarization: Secondary | ICD-10-CM | POA: Diagnosis present

## 2020-02-06 DIAGNOSIS — Z87891 Personal history of nicotine dependence: Secondary | ICD-10-CM

## 2020-02-06 DIAGNOSIS — R011 Cardiac murmur, unspecified: Secondary | ICD-10-CM | POA: Diagnosis not present

## 2020-02-06 DIAGNOSIS — D721 Eosinophilia, unspecified: Secondary | ICD-10-CM | POA: Diagnosis present

## 2020-02-06 DIAGNOSIS — W1830XA Fall on same level, unspecified, initial encounter: Secondary | ICD-10-CM | POA: Diagnosis present

## 2020-02-06 DIAGNOSIS — I6529 Occlusion and stenosis of unspecified carotid artery: Secondary | ICD-10-CM | POA: Diagnosis present

## 2020-02-06 DIAGNOSIS — R42 Dizziness and giddiness: Secondary | ICD-10-CM

## 2020-02-06 DIAGNOSIS — Z7982 Long term (current) use of aspirin: Secondary | ICD-10-CM | POA: Diagnosis not present

## 2020-02-06 DIAGNOSIS — I459 Conduction disorder, unspecified: Secondary | ICD-10-CM | POA: Diagnosis not present

## 2020-02-06 DIAGNOSIS — Z20822 Contact with and (suspected) exposure to covid-19: Secondary | ICD-10-CM | POA: Diagnosis present

## 2020-02-06 DIAGNOSIS — E1151 Type 2 diabetes mellitus with diabetic peripheral angiopathy without gangrene: Secondary | ICD-10-CM | POA: Diagnosis present

## 2020-02-06 DIAGNOSIS — I441 Atrioventricular block, second degree: Principal | ICD-10-CM | POA: Diagnosis present

## 2020-02-06 DIAGNOSIS — R001 Bradycardia, unspecified: Secondary | ICD-10-CM

## 2020-02-06 DIAGNOSIS — E785 Hyperlipidemia, unspecified: Secondary | ICD-10-CM | POA: Diagnosis present

## 2020-02-06 DIAGNOSIS — Y92000 Kitchen of unspecified non-institutional (private) residence as  the place of occurrence of the external cause: Secondary | ICD-10-CM | POA: Diagnosis not present

## 2020-02-06 DIAGNOSIS — Z7984 Long term (current) use of oral hypoglycemic drugs: Secondary | ICD-10-CM | POA: Diagnosis not present

## 2020-02-06 DIAGNOSIS — Z79899 Other long term (current) drug therapy: Secondary | ICD-10-CM

## 2020-02-06 DIAGNOSIS — R4182 Altered mental status, unspecified: Secondary | ICD-10-CM | POA: Diagnosis not present

## 2020-02-06 DIAGNOSIS — I69351 Hemiplegia and hemiparesis following cerebral infarction affecting right dominant side: Secondary | ICD-10-CM

## 2020-02-06 DIAGNOSIS — N189 Chronic kidney disease, unspecified: Secondary | ICD-10-CM | POA: Diagnosis not present

## 2020-02-06 DIAGNOSIS — R55 Syncope and collapse: Secondary | ICD-10-CM | POA: Diagnosis present

## 2020-02-06 DIAGNOSIS — I129 Hypertensive chronic kidney disease with stage 1 through stage 4 chronic kidney disease, or unspecified chronic kidney disease: Secondary | ICD-10-CM | POA: Diagnosis present

## 2020-02-06 DIAGNOSIS — E1122 Type 2 diabetes mellitus with diabetic chronic kidney disease: Secondary | ICD-10-CM | POA: Diagnosis present

## 2020-02-06 DIAGNOSIS — R531 Weakness: Secondary | ICD-10-CM | POA: Diagnosis not present

## 2020-02-06 LAB — COMPREHENSIVE METABOLIC PANEL
ALT: 28 U/L (ref 0–44)
AST: 36 U/L (ref 15–41)
Albumin: 3.8 g/dL (ref 3.5–5.0)
Alkaline Phosphatase: 91 U/L (ref 38–126)
Anion gap: 14 (ref 5–15)
BUN: 20 mg/dL (ref 8–23)
CO2: 23 mmol/L (ref 22–32)
Calcium: 9.4 mg/dL (ref 8.9–10.3)
Chloride: 102 mmol/L (ref 98–111)
Creatinine, Ser: 1.64 mg/dL — ABNORMAL HIGH (ref 0.61–1.24)
GFR calc Af Amer: 49 mL/min — ABNORMAL LOW (ref >60)
GFR calc non Af Amer: 42 mL/min — ABNORMAL LOW (ref >60)
Glucose, Bld: 181 mg/dL — ABNORMAL HIGH (ref 70–99)
Potassium: 4.6 mmol/L (ref 3.5–5.1)
Sodium: 139 mmol/L (ref 135–145)
Total Bilirubin: 1.1 mg/dL (ref 0.3–1.2)
Total Protein: 8 g/dL (ref 6.5–8.1)

## 2020-02-06 LAB — CBC
HCT: 41.7 % (ref 39.0–52.0)
Hemoglobin: 13.2 g/dL (ref 13.0–17.0)
MCH: 26.1 pg (ref 26.0–34.0)
MCHC: 31.7 g/dL (ref 30.0–36.0)
MCV: 82.4 fL (ref 80.0–100.0)
Platelets: 213 10*3/uL (ref 150–400)
RBC: 5.06 MIL/uL (ref 4.22–5.81)
RDW: 14.1 % (ref 11.5–15.5)
WBC: 14.2 10*3/uL — ABNORMAL HIGH (ref 4.0–10.5)
nRBC: 0 % (ref 0.0–0.2)

## 2020-02-06 LAB — RESPIRATORY PANEL BY RT PCR (FLU A&B, COVID)
Influenza A by PCR: NEGATIVE
Influenza B by PCR: NEGATIVE
SARS Coronavirus 2 by RT PCR: NEGATIVE

## 2020-02-06 LAB — TROPONIN I (HIGH SENSITIVITY)
Troponin I (High Sensitivity): 7 ng/L (ref <18)
Troponin I (High Sensitivity): 10 ng/L (ref <18)

## 2020-02-06 LAB — URINALYSIS, ROUTINE W REFLEX MICROSCOPIC
Bacteria, UA: NONE SEEN
Bilirubin Urine: NEGATIVE
Glucose, UA: >=500 mg/dL — AB
Hgb urine dipstick: NEGATIVE
Ketones, ur: NEGATIVE mg/dL
Leukocytes,Ua: NEGATIVE
Nitrite: NEGATIVE
Protein, ur: NEGATIVE mg/dL
Specific Gravity, Urine: 1.011 (ref 1.005–1.030)
pH: 6 (ref 5.0–8.0)

## 2020-02-06 LAB — TSH: TSH: 0.889 u[IU]/mL (ref 0.350–4.500)

## 2020-02-06 LAB — T4, FREE: Free T4: 0.89 ng/dL (ref 0.61–1.12)

## 2020-02-06 NOTE — ED Triage Notes (Addendum)
Pt presents from home for weakness, loss of apetite x 1 mo, worse x1 wk. Fell today, onto knees, no pain, denies LOC . Has only been eating noodles and small amounts of liquids. Denies weight loss, sob, cough, cp, pain from fall, diarrhea. Home meds for HTN and DM. EMS exam - diaphoretic, 2nd degree HB with PVCs, 167/85,248CBG, no pain  H/o CVA affecting R side strength and L side facial droop at baseline

## 2020-02-06 NOTE — ED Provider Notes (Signed)
[facility] EMERGENCY DEPARTMENT Provider Note   CSN: [id number] Arrival date & time: 02/06/20  2021     History Chief Complaint  Patient presents with  . Weakness    Calvin Jablonowski is a 69 y.o. male.  Patient presents via EMS from home with c/o generally weak for 1 month. Symptoms gradual onset, moderate, persistent, constant, slowly worse. Poor appetite and poor po intake x 1 month. Today, got up, generally felt weak, and fell. Brief loc/syncope. No headache. No neck or back pain. No chest pain or sob. No cough or uri symptoms. No known covid+ expsoure. No abd pain or nvd. No seizure activity noted. No focal or unilateral numbness/weakness - remote hx cva with residual right sided weakness - described as c/w pts baseline.   The history is provided by the patient and the EMS personnel.  Weakness Associated symptoms: no abdominal pain, no chest pain, no cough, no diarrhea, no dysuria, no fever, no headaches, no shortness of breath and no vomiting        Past Medical History:  Diagnosis Date  . Carotid artery occlusion   . Diabetes mellitus without complication (HCC)    Type II, takes metformin per patient(11/01/15)  . Hyperlipemia   . Hypertension   . Stroke [facility]) 06/2013   Left side- weak, unable to make a fist    Patient Active Problem List   Diagnosis Date Noted  . 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 Chronic kidney disease (CKD) 01/13/2019  . Medication management 09/14/2017  . Hypertension 07/27/2013  . Dyslipidemia associated with type 2 diabetes mellitus ([place]) 07/27/2013  . Carotid artery stenosis with cerebral infarction s/p left endarerectomy  07/20/2013  . History of embolic stroke (06/622) 76/28/3151  . Type 2 diabetes mellitus with peripheral vascular disease ([place]) 07/16/2013    Past Surgical History:  Procedure Laterality Date  . CAROTID ENDARTERECTOMY Left 08-04-13  . ENDARTERECTOMY Left 08/04/2013   Procedure: Left Carotid Endarterectomy with hemashield  patch angioplasty;  Surgeon: Rosetta Posner, MD;  Location: [place];  Service: Vascular;  Laterality: Left;  . NECK SURGERY  2003   Cord Compression  . TEE WITHOUT CARDIOVERSION N/A 07/20/2013   Procedure: TRANSESOPHAGEAL ECHOCARDIOGRAM (TEE);  Surgeon: Josue Hector, MD;  [facility] ENDOSCOPY;  Service: Cardiovascular;  Laterality: N/A;       No family history on file.  Social History   Tobacco Use  . Smoking status: Former Smoker    Years: 50.00    Quit date: 12/28/2005    Years since quitting: 14.1  . Smokeless tobacco: Former Network engineer Use Topics  . Alcohol use: No    Alcohol/week: 0.0 standard drinks    Comment: a few drinks occasionally   . Drug use: No    Home Medications Prior to Admission medications   Medication Sig Start Date End Date Taking? Authorizing Provider  aspirin 81 MG EC tablet Take 1 tablet (81 mg total) by mouth daily. 06/16/19   [name], MD  atorvastatin (LIPITOR) 80 MG tablet TAKE 1 TABLET BY MOUTH EVERY DAY 10/30/19   [name], MD  diltiazem (CARDIZEM CD) 120 MG 24 hr capsule TAKE 1 CAPSULE BY MOUTH EVERY DAY 12/12/19   [name], MD  Empagliflozin-metFORMIN HCl (SYNJARDY) 04-999 MG TABS Take 1 tablet by mouth daily. 02/05/20   [name], MD  lisinopril-hydrochlorothiazide (ZESTORETIC) 10-12.5 MG tablet Take 1 tablet by mouth daily. 10/11/19   Skeet Latch, MD    Allergies    Patient  has no known allergies.  Review of Systems   Review of Systems  Constitutional: Positive for appetite change. Negative for fever.  HENT: Negative for sore throat.   Eyes: Negative for pain and visual disturbance.  Respiratory: Negative for cough and shortness of breath.   Cardiovascular: Negative for chest pain.  Gastrointestinal: Negative for abdominal pain, diarrhea and vomiting.  Genitourinary: Negative for dysuria and flank pain.  Musculoskeletal: Negative for back pain and neck pain.  Skin: Negative  for rash.  Neurological: Positive for weakness and light-headedness. Negative for headaches.  Hematological: Does not bruise/bleed easily.  Psychiatric/Behavioral: Negative for confusion.    Physical Exam Updated Vital Signs Pulse 83   Temp 97.6 F (36.4 C) (Oral)   SpO2 100%   Physical Exam Vitals and nursing note reviewed.  Constitutional:      Appearance: Normal appearance. He is well-developed.  HENT:     Head:     Comments: Minimal contusion to mouth. Teeth grossly intact. No malocclusion. Facial bones/orbits grossly intact.     Nose: Nose normal.     Mouth/Throat:     Mouth: Mucous membranes are moist.     Pharynx: Oropharynx is clear.  Eyes:     General: No scleral icterus.    Conjunctiva/sclera: Conjunctivae normal.     Pupils: Pupils are equal, round, and reactive to light.  Neck:     Vascular: No carotid bruit.     Trachea: No tracheal deviation.     Comments: Thyroid not grossly enlarged or tender.  Cardiovascular:     Rate and Rhythm: Normal rate and regular rhythm.     Pulses: Normal pulses.     Heart sounds: Normal heart sounds. No murmur. No friction rub. No gallop.   Pulmonary:     Effort: Pulmonary effort is normal. No accessory muscle usage or respiratory distress.     Breath sounds: Normal breath sounds.  Abdominal:     General: Bowel sounds are normal. There is no distension.     Palpations: Abdomen is soft. There is no mass.     Tenderness: There is no abdominal tenderness. There is no guarding or rebound.     Hernia: No hernia is present.  Genitourinary:    Comments: No cva tenderness. Musculoskeletal:        General: No swelling or tenderness.     Cervical back: Normal range of motion and neck supple. No rigidity or tenderness.     Right lower leg: No edema.     Left lower leg: No edema.     Comments: CTLS spine, non tender, aligned, no step off. Good rom bil ext without pain or focal bony tenderness.   Skin:    General: Skin is warm and  dry.     Findings: No rash.  Neurological:     Mental Status: He is alert.     Comments: Alert, speech clear. Mild right sided weakness - pt reports at baseline. Left stre 5/5. sens grossly intact.   Psychiatric:        Mood and Affect: Mood normal.     ED Results / Procedures / Treatments   Labs (all labs ordered are listed, but only abnormal results are displayed) Results for orders placed or performed during the hospital encounter of 02/06/20  CBC  Result Value Ref Range   WBC 14.2 (H) 4.0 - 10.5 K/uL   RBC 5.06 4.22 - 5.81 MIL/uL   Hemoglobin 13.2 13.0 - 17.0 g/dL   HCT 41.7  39.0 - 52.0 %   MCV 82.4 80.0 - 100.0 fL   MCH 26.1 26.0 - 34.0 pg   MCHC 31.7 30.0 - 36.0 g/dL   RDW 76.8 11.5 - 72.6 %   Platelets 213 150 - 400 K/uL   nRBC 0.0 0.0 - 0.2 %  Urinalysis, Routine w reflex microscopic  Result Value Ref Range   Color, Urine STRAW (A) YELLOW   APPearance CLEAR CLEAR   Specific Gravity, Urine 1.011 1.005 - 1.030   pH 6.0 5.0 - 8.0   Glucose, UA >=500 (A) NEGATIVE mg/dL   Hgb urine dipstick NEGATIVE NEGATIVE   Bilirubin Urine NEGATIVE NEGATIVE   Ketones, ur NEGATIVE NEGATIVE mg/dL   Protein, ur NEGATIVE NEGATIVE mg/dL   Nitrite NEGATIVE NEGATIVE   Leukocytes,Ua NEGATIVE NEGATIVE   WBC, UA 0-5 0 - 5 WBC/hpf   Bacteria, UA NONE SEEN NONE SEEN  TSH  Result Value Ref Range   TSH 0.889 0.350 - 4.500 uIU/mL  T4, free  Result Value Ref Range   Free T4 0.89 0.61 - 1.12 ng/dL  Troponin I (High Sensitivity)  Result Value Ref Range   Troponin I (High Sensitivity) 7 <18 ng/L   CT HEAD WO CONTRAST  Result Date: 02/06/2020 CLINICAL DATA:  Altered mental status EXAM: CT HEAD WITHOUT CONTRAST TECHNIQUE: Contiguous axial images were obtained from the base of the skull through the vertex without intravenous contrast. COMPARISON:  07/16/2013 FINDINGS: Brain: Old right parietal and occipital infarcts. There is atrophy and chronic small vessel disease changes. No acute intracranial  abnormality. Specifically, no hemorrhage, hydrocephalus, mass lesion, acute infarction, or significant intracranial injury. Vascular: No hyperdense vessel or unexpected calcification. Skull: No acute calvarial abnormality. Sinuses/Orbits: Visualized paranasal sinuses and mastoids clear. Orbital soft tissues unremarkable. Other: None IMPRESSION: Old left parietal and occipital infarcts. Atrophy, chronic microvascular disease. No acute intracranial abnormality. Electronically Signed   By: Charlett Nose M.D.   On: 02/06/2020 21:48   ED ECG REPORT   Date: 02/06/2020  Rate: 74  Rhythm: normal sinus rhythm  QRS Axis: normal  Intervals: normal  ST/T Wave abnormalities: nonspecific ST/T changes  Conduction Disutrbances:right bundle branch block  Narrative Interpretation:   Old EKG Reviewed: changes noted I have personally reviewed the EKG tracing    Radiology CT HEAD WO CONTRAST  Result Date: 02/06/2020 CLINICAL DATA:  Altered mental status EXAM: CT HEAD WITHOUT CONTRAST TECHNIQUE: Contiguous axial images were obtained from the base of the skull through the vertex without intravenous contrast. COMPARISON:  07/16/2013 FINDINGS: Brain: Old right parietal and occipital infarcts. There is atrophy and chronic small vessel disease changes. No acute intracranial abnormality. Specifically, no hemorrhage, hydrocephalus, mass lesion, acute infarction, or significant intracranial injury. Vascular: No hyperdense vessel or unexpected calcification. Skull: No acute calvarial abnormality. Sinuses/Orbits: Visualized paranasal sinuses and mastoids clear. Orbital soft tissues unremarkable. Other: None IMPRESSION: Old left parietal and occipital infarcts. Atrophy, chronic microvascular disease. No acute intracranial abnormality. Electronically Signed   By: Charlett Nose M.D.   On: 02/06/2020 21:48    Procedures Procedures (including critical care time)  Medications Ordered in ED Medications - No data to display  ED  Course  I have reviewed the triage vital signs and the nursing notes.  Pertinent labs & imaging results that were available during my care of the patient were reviewed by me and considered in my medical decision making (see chart for details).    MDM Rules/Calculators/A&P  Iv ns. Continuous pulse ox and monitor. Stat labs. Ecg. Imaging.   Reviewed nursing notes and prior charts for additional history.   CT reviewed/interpreted by me - no hem. No large/acute cva.   Labs reviewed/interpreted by me - hgb normal, wbc mildly elev. ua neg for infection. Thyroid tests normal. covid pending.   Fadi Menter was evaluated in Emergency Department on 02/06/2020 for the symptoms described in the history of present illness. He was evaluated in the context of the global COVID-19 pandemic, which necessitated consideration that the patient might be at risk for infection with the SARS-CoV-2 virus that causes COVID-19. Institutional protocols and algorithms that pertain to the evaluation of patients at risk for COVID-19 are in a state of rapid change based on information released by regulatory bodies including the CDC and federal   and state organizations. These policies and algorithms were followed during the patient's care in the ED.  EMS ecg now at bedside - appears to have period with EMS in third degree heart block - see attached epic image.   While in ED, hr as low as 40 per rn, but on recheck back in sinus rhythm.  With syncope, ?high degree heart block - will admit to primary care service (patient seen in Internal Medicine Clinic just yesterday). Resident on call consulted for admission.    Final Clinical Impression(s) / ED Diagnoses Final diagnoses:  None    Rx / DC Orders ED Discharge Orders    None       Cathren Laine, MD 02/06/20 2324

## 2020-02-06 NOTE — ED Notes (Signed)
Per lab, CMP will require redraw d/t hemolysis.

## 2020-02-06 NOTE — ED Notes (Signed)
Pt able to ambulate in hallway  Independently and without assistive device. Denies dizziness, sob, pain.

## 2020-02-07 ENCOUNTER — Observation Stay (HOSPITAL_BASED_OUTPATIENT_CLINIC_OR_DEPARTMENT_OTHER): Payer: PPO

## 2020-02-07 ENCOUNTER — Encounter (HOSPITAL_COMMUNITY)
Admission: EM | Disposition: A | Discharge status: Home / Self Care | Payer: Self-pay | Source: Home / Self Care | Attending: Internal Medicine

## 2020-02-07 DIAGNOSIS — D721 Eosinophilia, unspecified: Secondary | ICD-10-CM

## 2020-02-07 DIAGNOSIS — I441 Atrioventricular block, second degree: Secondary | ICD-10-CM

## 2020-02-07 DIAGNOSIS — Z87891 Personal history of nicotine dependence: Secondary | ICD-10-CM

## 2020-02-07 DIAGNOSIS — R001 Bradycardia, unspecified: Secondary | ICD-10-CM | POA: Diagnosis present

## 2020-02-07 DIAGNOSIS — I129 Hypertensive chronic kidney disease with stage 1 through stage 4 chronic kidney disease, or unspecified chronic kidney disease: Secondary | ICD-10-CM | POA: Diagnosis present

## 2020-02-07 DIAGNOSIS — I493 Ventricular premature depolarization: Secondary | ICD-10-CM | POA: Diagnosis present

## 2020-02-07 DIAGNOSIS — R42 Dizziness and giddiness: Secondary | ICD-10-CM

## 2020-02-07 DIAGNOSIS — Z79899 Other long term (current) drug therapy: Secondary | ICD-10-CM | POA: Diagnosis not present

## 2020-02-07 DIAGNOSIS — Z7984 Long term (current) use of oral hypoglycemic drugs: Secondary | ICD-10-CM

## 2020-02-07 DIAGNOSIS — E1151 Type 2 diabetes mellitus with diabetic peripheral angiopathy without gangrene: Secondary | ICD-10-CM | POA: Diagnosis present

## 2020-02-07 DIAGNOSIS — I6529 Occlusion and stenosis of unspecified carotid artery: Secondary | ICD-10-CM | POA: Diagnosis present

## 2020-02-07 DIAGNOSIS — R55 Syncope and collapse: Secondary | ICD-10-CM

## 2020-02-07 DIAGNOSIS — R011 Cardiac murmur, unspecified: Secondary | ICD-10-CM | POA: Diagnosis not present

## 2020-02-07 DIAGNOSIS — Z20822 Contact with and (suspected) exposure to covid-19: Secondary | ICD-10-CM | POA: Diagnosis present

## 2020-02-07 DIAGNOSIS — N183 Chronic kidney disease, stage 3 unspecified: Secondary | ICD-10-CM

## 2020-02-07 DIAGNOSIS — Y92000 Kitchen of unspecified non-institutional (private) residence as  the place of occurrence of the external cause: Secondary | ICD-10-CM | POA: Diagnosis not present

## 2020-02-07 DIAGNOSIS — E1122 Type 2 diabetes mellitus with diabetic chronic kidney disease: Secondary | ICD-10-CM

## 2020-02-07 DIAGNOSIS — Z7982 Long term (current) use of aspirin: Secondary | ICD-10-CM

## 2020-02-07 DIAGNOSIS — I69351 Hemiplegia and hemiparesis following cerebral infarction affecting right dominant side: Secondary | ICD-10-CM

## 2020-02-07 DIAGNOSIS — E785 Hyperlipidemia, unspecified: Secondary | ICD-10-CM | POA: Diagnosis present

## 2020-02-07 DIAGNOSIS — W1830XA Fall on same level, unspecified, initial encounter: Secondary | ICD-10-CM | POA: Diagnosis present

## 2020-02-07 DIAGNOSIS — I4510 Unspecified right bundle-branch block: Secondary | ICD-10-CM | POA: Diagnosis present

## 2020-02-07 HISTORY — PX: PACEMAKER IMPLANT: EP1218

## 2020-02-07 LAB — CBC WITH DIFFERENTIAL/PLATELET
Abs Immature Granulocytes: 0.03 10*3/uL (ref 0.00–0.07)
Basophils Absolute: 0.1 10*3/uL (ref 0.0–0.2)
Basophils Absolute: 0.1 10*3/uL (ref 0.0–0.1)
Basophils Absolute: 0.1 10*3/uL (ref 0.0–0.1)
Basophils Relative: 1 %
Basophils Relative: 1 %
Basos: 1 %
EOS (ABSOLUTE): 1.4 10*3/uL — ABNORMAL HIGH (ref 0.0–0.4)
Eos: 17 %
Eosinophils Absolute: 0.5 10*3/uL (ref 0.0–0.5)
Eosinophils Absolute: 1.4 10*3/uL — ABNORMAL HIGH (ref 0.0–0.5)
Eosinophils Relative: 4 %
Eosinophils Relative: 14 %
HCT: 39 % (ref 39.0–52.0)
HCT: 42.1 % (ref 39.0–52.0)
Hematocrit: 40.2 % (ref 37.5–51.0)
Hemoglobin: 13 g/dL (ref 13.0–17.7)
Hemoglobin: 12.6 g/dL — ABNORMAL LOW (ref 13.0–17.0)
Hemoglobin: 13.5 g/dL (ref 13.0–17.0)
Immature Grans (Abs): 0 10*3/uL (ref 0.0–0.1)
Immature Granulocytes: 0 %
Immature Granulocytes: 0 %
Lymphocytes Absolute: 3.7 10*3/uL — ABNORMAL HIGH (ref 0.7–3.1)
Lymphocytes Relative: 24 %
Lymphocytes Relative: 34 %
Lymphs Abs: 2.9 10*3/uL (ref 0.7–4.0)
Lymphs Abs: 3.4 10*3/uL (ref 0.7–4.0)
Lymphs: 42 %
MCH: 25.5 pg — ABNORMAL LOW (ref 26.6–33.0)
MCH: 26.1 pg (ref 26.0–34.0)
MCH: 25.9 pg — ABNORMAL LOW (ref 26.0–34.0)
MCHC: 32.3 g/dL (ref 31.5–35.7)
MCHC: 32.3 g/dL (ref 30.0–36.0)
MCHC: 32.1 g/dL (ref 30.0–36.0)
MCV: 79 fL (ref 79–97)
MCV: 80.9 fL (ref 80.0–100.0)
MCV: 80.8 fL (ref 80.0–100.0)
Monocytes Absolute: 0.6 10*3/uL (ref 0.1–0.9)
Monocytes Absolute: 0.9 10*3/uL (ref 0.1–1.0)
Monocytes Absolute: 0.7 10*3/uL (ref 0.1–1.0)
Monocytes Relative: 8 %
Monocytes Relative: 7 %
Monocytes: 7 %
Neutro Abs: 7.5 10*3/uL (ref 1.7–7.7)
Neutro Abs: 4.5 10*3/uL (ref 1.7–7.7)
Neutrophils Absolute: 2.8 10*3/uL (ref 1.4–7.0)
Neutrophils Relative %: 63 %
Neutrophils Relative %: 44 %
Neutrophils: 33 %
Platelets: 246 10*3/uL (ref 150–450)
Platelets: 222 10*3/uL (ref 150–400)
Platelets: 207 10*3/uL (ref 150–400)
RBC: 5.1 x10E6/uL (ref 4.14–5.80)
RBC: 4.82 MIL/uL (ref 4.22–5.81)
RBC: 5.21 MIL/uL (ref 4.22–5.81)
RDW: 13.2 % (ref 11.6–15.4)
RDW: 13.9 % (ref 11.5–15.5)
RDW: 13.9 % (ref 11.5–15.5)
WBC: 8.6 10*3/uL (ref 3.4–10.8)
WBC: 11.9 10*3/uL — ABNORMAL HIGH (ref 4.0–10.5)
WBC: 10.2 10*3/uL (ref 4.0–10.5)
nRBC: 0 % (ref 0.0–0.2)
nRBC: 0 % (ref 0.0–0.2)

## 2020-02-07 LAB — TRYPTASE: Tryptase: 4.1 ug/L (ref 2.2–13.2)

## 2020-02-07 LAB — ECHOCARDIOGRAM COMPLETE
Height: 65 in
Weight: 2352.75 oz

## 2020-02-07 LAB — CMP14 + ANION GAP
ALT: 24 IU/L (ref 0–44)
AST: 20 IU/L (ref 0–40)
Albumin/Globulin Ratio: 1.1 — ABNORMAL LOW (ref 1.2–2.2)
Albumin: 4.1 g/dL (ref 3.8–4.8)
Alkaline Phosphatase: 109 IU/L (ref 39–117)
Anion Gap: 16 mmol/L (ref 10.0–18.0)
BUN/Creatinine Ratio: 10 (ref 10–24)
BUN: 16 mg/dL (ref 8–27)
Bilirubin Total: 0.3 mg/dL (ref 0.0–1.2)
CO2: 24 mmol/L (ref 20–29)
Calcium: 10.1 mg/dL (ref 8.6–10.2)
Chloride: 99 mmol/L (ref 96–106)
Creatinine, Ser: 1.55 mg/dL — ABNORMAL HIGH (ref 0.76–1.27)
GFR calc Af Amer: 52 mL/min/{1.73_m2} — ABNORMAL LOW (ref >59)
GFR calc non Af Amer: 45 mL/min/{1.73_m2} — ABNORMAL LOW (ref >59)
Globulin, Total: 3.7 g/dL (ref 1.5–4.5)
Glucose: 185 mg/dL — ABNORMAL HIGH (ref 65–99)
Potassium: 5 mmol/L (ref 3.5–5.2)
Sodium: 139 mmol/L (ref 134–144)
Total Protein: 7.8 g/dL (ref 6.0–8.5)

## 2020-02-07 LAB — GLUCOSE, CAPILLARY
Glucose-Capillary: 187 mg/dL — ABNORMAL HIGH (ref 70–99)
Glucose-Capillary: 148 mg/dL — ABNORMAL HIGH (ref 70–99)
Glucose-Capillary: 153 mg/dL — ABNORMAL HIGH (ref 70–99)
Glucose-Capillary: 278 mg/dL — ABNORMAL HIGH (ref 70–99)

## 2020-02-07 LAB — VITAMIN B12: Vitamin B-12: 629 pg/mL (ref 232–1245)

## 2020-02-07 LAB — CBG MONITORING, ED: Glucose-Capillary: 245 mg/dL — ABNORMAL HIGH (ref 70–99)

## 2020-02-07 LAB — HIV ANTIBODY (ROUTINE TESTING W REFLEX): HIV Screen 4th Generation wRfx: NONREACTIVE

## 2020-02-07 LAB — SAVE SMEAR(SSMR), FOR PROVIDER SLIDE REVIEW

## 2020-02-07 SURGERY — PACEMAKER IMPLANT

## 2020-02-07 MED ORDER — LIDOCAINE HCL (PF) 1 % IJ SOLN
INTRAMUSCULAR | Status: DC | PRN
  Administered 2020-02-07: 60 mL

## 2020-02-07 MED ORDER — INSULIN ASPART 100 UNIT/ML ~~LOC~~ SOLN
3.0000 [IU] | Freq: Three times a day (TID) | SUBCUTANEOUS | Status: DC
  Administered 2020-02-07: 3 [IU] via SUBCUTANEOUS

## 2020-02-07 MED ORDER — SODIUM CHLORIDE 0.9 % IV SOLN: INTRAVENOUS | Status: DC

## 2020-02-07 MED ORDER — LIDOCAINE HCL (PF) 1 % IJ SOLN
INTRAMUSCULAR | Status: AC
  Filled 2020-02-07: qty 90

## 2020-02-07 MED ORDER — ADULT MULTIVITAMIN W/MINERALS CH
1.0000 | ORAL_TABLET | Freq: Every day | ORAL | Status: DC
  Administered 2020-02-07 – 2020-02-08 (×2): 1 via ORAL
  Filled 2020-02-07 (×2): qty 1

## 2020-02-07 MED ORDER — MIDAZOLAM HCL 5 MG/5ML IJ SOLN
INTRAMUSCULAR | Status: AC
  Filled 2020-02-07: qty 5

## 2020-02-07 MED ORDER — FENTANYL CITRATE (PF) 100 MCG/2ML IJ SOLN
INTRAMUSCULAR | Status: DC | PRN
  Administered 2020-02-07: 25 ug via INTRAVENOUS

## 2020-02-07 MED ORDER — CEFAZOLIN SODIUM-DEXTROSE 2-4 GM/100ML-% IV SOLN
INTRAVENOUS | Status: AC
  Filled 2020-02-07: qty 100

## 2020-02-07 MED ORDER — HEPARIN (PORCINE) IN NACL 1000-0.9 UT/500ML-% IV SOLN
INTRAVENOUS | Status: AC
  Filled 2020-02-07: qty 500

## 2020-02-07 MED ORDER — SODIUM CHLORIDE 0.9 % IV SOLN: 250.0000 mL | INTRAVENOUS | Status: DC

## 2020-02-07 MED ORDER — ACETAMINOPHEN 325 MG PO TABS: 650.0000 mg | ORAL_TABLET | Freq: Four times a day (QID) | ORAL | Status: DC | PRN

## 2020-02-07 MED ORDER — SODIUM CHLORIDE 0.9 % IV SOLN
80.0000 mg | INTRAVENOUS | Status: AC
  Administered 2020-02-07: 80 mg
  Filled 2020-02-07: qty 2

## 2020-02-07 MED ORDER — LIDOCAINE HCL 1 % IJ SOLN
INTRAMUSCULAR | Status: AC
  Filled 2020-02-07: qty 60

## 2020-02-07 MED ORDER — SODIUM CHLORIDE 0.9 % IV SOLN
INTRAVENOUS | Status: AC
  Filled 2020-02-07: qty 2

## 2020-02-07 MED ORDER — ENSURE ENLIVE PO LIQD
237.0000 mL | Freq: Two times a day (BID) | ORAL | Status: DC
  Administered 2020-02-07: 237 mL via ORAL

## 2020-02-07 MED ORDER — MIDAZOLAM HCL 5 MG/5ML IJ SOLN
INTRAMUSCULAR | Status: DC | PRN
  Administered 2020-02-07: 2 mg via INTRAVENOUS

## 2020-02-07 MED ORDER — INSULIN ASPART 100 UNIT/ML ~~LOC~~ SOLN
0.0000 [IU] | Freq: Three times a day (TID) | SUBCUTANEOUS | Status: DC
  Administered 2020-02-07 – 2020-02-08 (×4): 3 [IU] via SUBCUTANEOUS
  Administered 2020-02-08: 15 [IU] via SUBCUTANEOUS

## 2020-02-07 MED ORDER — FENTANYL CITRATE (PF) 100 MCG/2ML IJ SOLN
INTRAMUSCULAR | Status: AC
  Filled 2020-02-07: qty 2

## 2020-02-07 MED ORDER — ONDANSETRON HCL 4 MG/2ML IJ SOLN: 4.0000 mg | Freq: Four times a day (QID) | INTRAMUSCULAR | Status: DC | PRN

## 2020-02-07 MED ORDER — YOU HAVE A PACEMAKER BOOK
Freq: Once | Status: AC
  Filled 2020-02-07: qty 1

## 2020-02-07 MED ORDER — ENOXAPARIN SODIUM 30 MG/0.3ML ~~LOC~~ SOLN
30.0000 mg | SUBCUTANEOUS | Status: DC
  Administered 2020-02-08: 30 mg via SUBCUTANEOUS
  Filled 2020-02-07: qty 0.3

## 2020-02-07 MED ORDER — INSULIN ASPART 100 UNIT/ML ~~LOC~~ SOLN
0.0000 [IU] | Freq: Every day | SUBCUTANEOUS | Status: DC
  Administered 2020-02-07: 2 [IU] via SUBCUTANEOUS

## 2020-02-07 MED ORDER — HEPARIN (PORCINE) IN NACL 1000-0.9 UT/500ML-% IV SOLN
INTRAVENOUS | Status: DC | PRN
  Administered 2020-02-07: 500 mL

## 2020-02-07 MED ORDER — CEFAZOLIN SODIUM-DEXTROSE 1-4 GM/50ML-% IV SOLN
1.0000 g | Freq: Four times a day (QID) | INTRAVENOUS | Status: AC
  Administered 2020-02-07 – 2020-02-08 (×3): 1 g via INTRAVENOUS
  Filled 2020-02-07 (×3): qty 50

## 2020-02-07 MED ORDER — ASPIRIN EC 81 MG PO TBEC
81.0000 mg | DELAYED_RELEASE_TABLET | Freq: Every day | ORAL | Status: DC
  Administered 2020-02-08: 81 mg via ORAL
  Filled 2020-02-07: qty 1

## 2020-02-07 MED ORDER — INSULIN ASPART 100 UNIT/ML ~~LOC~~ SOLN: 4.0000 [IU] | Freq: Three times a day (TID) | SUBCUTANEOUS | Status: DC

## 2020-02-07 MED ORDER — ASPIRIN EC 81 MG PO TBEC: 81.0000 mg | DELAYED_RELEASE_TABLET | Freq: Every day | ORAL | Status: DC

## 2020-02-07 MED ORDER — ATORVASTATIN CALCIUM 80 MG PO TABS
80.0000 mg | ORAL_TABLET | Freq: Every day | ORAL | Status: DC
  Administered 2020-02-07: 80 mg via ORAL
  Filled 2020-02-07: qty 1

## 2020-02-07 MED ORDER — ACETAMINOPHEN 325 MG PO TABS: 325.0000 mg | ORAL_TABLET | ORAL | Status: DC | PRN

## 2020-02-07 MED ORDER — SODIUM CHLORIDE 0.9% FLUSH: 3.0000 mL | INTRAVENOUS | Status: DC | PRN

## 2020-02-07 MED ORDER — ACETAMINOPHEN 650 MG RE SUPP: 650.0000 mg | Freq: Four times a day (QID) | RECTAL | Status: DC | PRN

## 2020-02-07 MED ORDER — SODIUM CHLORIDE 0.9% FLUSH: 3.0000 mL | Freq: Two times a day (BID) | INTRAVENOUS | Status: DC

## 2020-02-07 MED ORDER — CHLORHEXIDINE GLUCONATE 4 % EX LIQD: 60.0000 mL | Freq: Once | CUTANEOUS | Status: DC

## 2020-02-07 MED ORDER — CEFAZOLIN SODIUM-DEXTROSE 2-4 GM/100ML-% IV SOLN
2.0000 g | INTRAVENOUS | Status: AC
  Administered 2020-02-07: 2 g via INTRAVENOUS
  Filled 2020-02-07: qty 100

## 2020-02-07 MED ORDER — INSULIN ASPART 100 UNIT/ML ~~LOC~~ SOLN: 0.0000 [IU] | Freq: Three times a day (TID) | SUBCUTANEOUS | Status: DC

## 2020-02-07 MED ORDER — INSULIN ASPART 100 UNIT/ML ~~LOC~~ SOLN
0.0000 [IU] | Freq: Every day | SUBCUTANEOUS | Status: DC
  Administered 2020-02-07: 3 [IU] via SUBCUTANEOUS

## 2020-02-07 SURGICAL SUPPLY — 12 items
CABLE SURGICAL S-101-97-12 (CABLE) ×3 IMPLANT
CATH HIS SELECTSITE C304HIS (CATHETERS) ×3 IMPLANT
IPG PACE AZUR XT DR MRI W1DR01 (Pacemaker) ×1 IMPLANT
LEAD CAPSURE NOVUS 5076-52CM (Lead) ×3 IMPLANT
LEAD SELECT SECURE 3830 383069 (Lead) ×1 IMPLANT
PACE AZURE XT DR MRI W1DR01 (Pacemaker) ×3 IMPLANT
PAD PRO RADIOLUCENT 2001M-C (PAD) ×3 IMPLANT
SELECT SECURE 3830 383069 (Lead) ×3 IMPLANT
SHEATH 7FR PRELUDE SNAP 13 (SHEATH) ×6 IMPLANT
SLITTER 6232ADJ (MISCELLANEOUS) ×3 IMPLANT
TRAY PACEMAKER INSERTION (PACKS) ×3 IMPLANT
WIRE HI TORQ VERSACORE-J 145CM (WIRE) ×3 IMPLANT

## 2020-02-07 NOTE — H&P (Signed)
Date: 02/07/2020               Patient Name:  Juan [name] MRN: [id number]  DOB: 11-03-1951 Age / Sex: 69 y.o., male   PCP: Burna Cash, MD         Medical Service: Internal Medicine Teaching Service         Attending Physician: Dr. Earl Lagos, MD    First Contact: Dr. Thurmon Fair  Pager: [phone]  Second Contact: Dr. Hermine Messick  Pager: [phone]       After Hours (After 5p/  First Contact Pager: [phone]  weekends / holidays): Second Contact Pager: [phone]   Chief Complaint: weakness and fall  History of Present Illness:  Juan [name] is a 69 year old M with significant PMH of HTN, CKD, type II DM, and CVA who presents after a presyncopal fall this evening. History provided per the pt and his son, Juan [name], on the telephone as an interpreter. Pt states he has been feeling weak over the course of the last week or so. Today, he was sitting at the table after a meal and began to fell dizzy and weak upon standing. Pt remembers the entire episode, stating he was dizzy and fell to the side upon his knees in order to avoid falling forward where his granddaughter was sitting at the table. Denies loss of consciousness, vision changes, head trauma, chest pain, palpitations, shortness of breath, nausea, focal weakness, or seizure activity. The episode was witnessed by his wife. Upon arrival to the ED, pt denies any continued dizziness/weakness. States he has never had an episode like this before.   In the ED, pt was afebrile, BP 150/56, RR 17, and O2 saturation 100% on room air. His HR was initially 83, then subsequently fell to the 50's. EKG remarkable for normal sinus rhythm and RBBB. CT head demonstrated old L parietal and occipital infarcts and without acute abnormality or bleed. Lab work significant for WBC 14.2, Hgb 13.2, Na 139, K 4.6, bicarb 23, Cr 1.64, BUN 20, and Glc 181. Troponin normal x2. RVP negative and UA with >500 glc. There was a question of possible high degree heart  block on the EMS EKG, so IMTS was called for admission.   Meds: Current Meds  Medication Sig  . aspirin 81 MG EC tablet Take 1 tablet (81 mg total) by mouth daily.  Marland Kitchen atorvastatin (LIPITOR) 80 MG tablet TAKE 1 TABLET BY MOUTH EVERY DAY (Patient taking differently: Take 80 mg by mouth daily. )  . diltiazem (CARDIZEM CD) 120 MG 24 hr capsule TAKE 1 CAPSULE BY MOUTH EVERY DAY (Patient taking differently: Take 120 mg by mouth daily. )  . Empagliflozin-metFORMIN HCl (SYNJARDY) 04-999 MG TABS Take 1 tablet by mouth daily.  Marland Kitchen lisinopril-hydrochlorothiazide (ZESTORETIC) 10-12.5 MG tablet Take 1 tablet by mouth daily.   Allergies: Allergies as of 02/06/2020  . (No Known Allergies)   Past Medical History:  Diagnosis Date  . Carotid artery occlusion   . Diabetes mellitus without complication (HCC)    Type II, takes metformin per patient(11/01/15)  . Hyperlipemia   . Hypertension   . Stroke [facility]) 06/2013   Left side- weak, unable to make a fist   Family History:  No family history on file.   Social History:  Social History   Tobacco Use  . Smoking status: Former Smoker    Years: 50.00    Quit date: 12/28/2005    Years since quitting: 14.1  . Smokeless  tobacco: Former Building surveyor Topics  . Alcohol use: No    Alcohol/week: 0.0 standard drinks    Comment: a few drinks occasionally   . Drug use: No   Review of Systems: A complete ROS was negative except as per HPI.  Physical Exam: Blood pressure (!) 144/66, pulse (!) 53, temperature 97.6 F (36.4 C), temperature source Oral, resp. rate 18, weight 66.7 kg, SpO2 95 %. Physical Exam Vitals and nursing note reviewed.  Constitutional:      General: He is not in acute distress.    Appearance: He is not ill-appearing.  HENT:     Head: Normocephalic and atraumatic.     Mouth/Throat:     Mouth: Mucous membranes are moist.  Eyes:     Extraocular Movements: Extraocular movements intact.     Conjunctiva/sclera: Conjunctivae  normal.  Cardiovascular:     Rate and Rhythm: Regular rhythm. Bradycardia present.     Heart sounds: Murmur present. Systolic murmur present.  Pulmonary:     Effort: Pulmonary effort is normal. No respiratory distress.     Breath sounds: Normal breath sounds.  Abdominal:     General: Abdomen is flat. Bowel sounds are normal. There is no distension.     Palpations: Abdomen is soft.     Tenderness: There is no abdominal tenderness.  Musculoskeletal:        General: Normal range of motion.     Right lower leg: No edema.     Left lower leg: No edema.  Skin:    General: Skin is warm and dry.  Neurological:     Mental Status: He is alert and oriented to person, place, and time. Mental status is at baseline.     Comments: Residual R sided weakness, stable per pt.  Psychiatric:        Mood and Affect: Mood normal.    EKG: personally reviewed my interpretation is sinus rhythm, RBBB, PVC seen, stable from prior in 09/2019.  CXR: personally reviewed my interpretation is stable cardiomediastinal borders from prior. No focal infiltrate, effusion, or pneumothorax. Plate and screws in place over C spine.   Assessment & Plan by Problem: Active Problems:   Postural dizziness with presyncope  Juan [name] is a 69 year old M with significant PMH of HTN, CKD, type II DM, and CVA who presents after a presyncopal fall from standing.   Presyncope Pt with a presyncopal episode upon standing. He recalls the entire event, no trauma, LOC, or preceeding palpitations. He was able to ambulate in the ED without assistance and had no recurrence of symptoms. Has persistent bradycardia and multiple missed beat upon review of ED telemetry. Cardiogenic cause of syncope concerning given telemetry findings, though history is most consistent with orthostatic hypotension. He is on diltiazem at home which could be worsening AV block. Of note, pt also has persistent eosinophilia which his PCP is working up for potential  hypereosinophilic syndrome given pt's worsening concurrent worsening renal function. An infiltrative process, like Loeffler's endocarditis, could be causing conduction abnormalities as well.  - echo to evaluate for structural heart diease - pending results and tele monitoring, may need cards consult in the AM - holding home diltiazem  - continue tele - orthostatic vitals  Persistent eosinophilia Pt noted to have an elevated absolute eosinophilia count dating back to 2016. Normal tryptase of 4.1 and Vitamin B12 of 629, makes a hematologic disorder less likely. Path smear from 08/24/2019 noted to have eosinophilia. - add on diff  to AM CBC - repeat save smear and pathology review  Type II DM Pt on empagliflozin-metformin at home. Last A1c on 2/8 was 8.1. Glc 181 on admission. - novolog 3u TID with meals - moderate SSI + qhs  History of CVA Pt with residual R sided weakness on examination. - continue home atorvastatin 80mg  and aspirin 81mg  daily  Diet - carb modified  Fluids - none DVT ppx - enoxaparin 30mg  subQ daily CODE STATUS - FULL CODE  Dispo: Admit patient to Observation with expected length of stay less than 2 midnights.  Signed: , MD 02/07/2020, 3:38 AM  Pager: [phone]

## 2020-02-07 NOTE — Progress Notes (Signed)
Initial Nutrition Assessment  RD working remotely.   DOCUMENTATION CODES:   Not applicable  INTERVENTION:   Continue Ensure Enlive po BID, each supplement provides 350 kcal and 20 grams of protein  MVI with minerals    NUTRITION DIAGNOSIS:   Inadequate oral intake related to lethargy/confusion as evidenced by per patient/family report.    GOAL:   Patient will meet greater than or equal to 90% of their needs    MONITOR:   Supplement acceptance, PO intake, Labs, Weight trends  REASON FOR ASSESSMENT:   Malnutrition Screening Tool    ASSESSMENT:   Pt with a PMH significant for HTN, CKD, DM2, and CVA with residual R sided weakness admitted with postural dizziness with presyncope, 2:1 AV block.  Pt had pacemaker implantation today.  Discussed pt with RN. RN reports pt consuming meals and supplements well. Pt noted to need an interpreter (pt speaks Montagnard Rhade); RN reports pt's son is in the room to assist with interpretation.   RD attempted to call pt's room and received no answer.  PO intake: 75% x1 recorded meal  Medications reviewed and include: Ensure Enlive BID, SSI, Novolog  Labs reviewed. CBGs 148-245  UOP: x24 hours I/O: +427ml since admit  Based on wts available in chart, pt's wt appears stable over last year.   NUTRITION - FOCUSED PHYSICAL EXAM:  Deferred; RD working remotely.  Diet Order:   Diet Order            Diet heart healthy/carb modified Room service appropriate? No; Fluid consistency: Thin; Fluid restriction: 1500 mL Fluid  Diet effective now              EDUCATION NEEDS:   No education needs have been identified at this time  Skin:  Skin Assessment: Reviewed RN Assessment  Last BM:  PTA  Height:   Ht Readings from Last 1 Encounters:  02/07/20 5\' 5"  (1.651 m)    Weight:   Wt Readings from Last 1 Encounters:  02/07/20 66.7 kg    BMI:  Body mass index is 24.47 kg/m.  Estimated Nutritional Needs:    Kcal:  1500-1700  Protein:  85-100 grams  Fluid:  1.5L/d    04/06/20, MS, RD, LDN RD pager number and weekend/on-call pager number located in Amion.

## 2020-02-07 NOTE — Progress Notes (Signed)
  Echocardiogram 2D Echocardiogram has been performed.  Juan [name] A Juan [name] 02/07/2020, 8:35 AM

## 2020-02-07 NOTE — ED Notes (Signed)
This RN attempted report to 6E. Reached the wrong RN's phone by mistake. Was told RN would return call. Will call again at 0215 if call not returned.

## 2020-02-07 NOTE — Consult Note (Addendum)
ELECTROPHYSIOLOGY CONSULT NOTE    Patient ID: Juan [name] MRN: [id number], DOB/AGE: [birth year] 69 y.o.  Admit date: 02/06/2020 Date of Consult: 02/07/2020  Primary Physician: Burna Cash, MD Electrophysiologist: New   Referring Provider: 2:1 AV block  Patient Profile: Juan [name] is a 69 y.o. male with a history of HTN, CKD, DM2, and CVA with residual R sided weakness who is being seen today for the evaluation of 2:1 AV block at the request of Dr. Earlie Raveling.  Pt speaks Montagnard. HPI obtained from chart and from interpretation via telephone/his daughter  HPI:  Juan [name] is a 69 y.o. male with medical history above. Admitted 02/05/2019 overnight with weakness and a fall. He was sitting at the dinner table and stood up, and felt dizzy and weak. He remembers the entire episodes and denies frank syncope. He fell forward on to his knees to avoid hitting his granddaughter. No LOC, vision changes, head trauma, chest pain, palpitations, focal weakness or seizure activity leading up to it. Episode witnessed by wife.   In the ED HR varied between 50-80s. CT head demonstrated old L parietal and occipital infarcts and without acute abnormality or bleed. Lab work significant for WBC 14.2, Hgb 13.2, Na 139, K 4.6, bicarb 23, Cr 1.64, BUN 20, and Glc 181. Troponin normal x2. RVP negative and UA with >500 glc. Home diltiazem held with ? AV block on EKG.   EKG 2/10 at 0430 shows 2:1 AV block with a PVC and rate of 49 bpm.   He is feeling OK currently. Denies chest pain, palpitations, dyspnea, PND, orthopnea, nausea, vomiting, edema, weight gain, or early satiety. Episode history is slightly conflicting. His daughter and son say he passed/blacked out, but earlier histories said no LOC. Last dose of diltiazem yesterday am.   Past Medical History:  Diagnosis Date  . Carotid artery occlusion   . Diabetes mellitus without complication (HCC)    Type II, takes metformin per patient(11/01/15)  .  Hyperlipemia   . Hypertension   . Stroke [facility]) 06/2013   Left side- weak, unable to make a fist     Surgical History:  Past Surgical History:  Procedure Laterality Date  . CAROTID ENDARTERECTOMY Left 08-04-13  . ENDARTERECTOMY Left 08/04/2013   Procedure: Left Carotid Endarterectomy with hemashield patch angioplasty;  Surgeon: Larina Earthly, MD;  Location: [facility] OR;  Service: Vascular;  Laterality: Left;  . NECK SURGERY  2003   Cord Compression  . TEE WITHOUT CARDIOVERSION N/A 07/20/2013   Procedure: TRANSESOPHAGEAL ECHOCARDIOGRAM (TEE);  Surgeon: Wendall Stade, MD;  Location: [facility] ENDOSCOPY;  Service: Cardiovascular;  Laterality: N/A;     Medications Prior to Admission  Medication Sig Dispense Refill Last Dose  . aspirin 81 MG EC tablet Take 1 tablet (81 mg total) by mouth daily. 90 tablet 3 02/06/2020 at Unknown time  . atorvastatin (LIPITOR) 80 MG tablet TAKE 1 TABLET BY MOUTH EVERY DAY (Patient taking differently: Take 80 mg by mouth daily. ) 90 tablet 3 02/06/2020 at Unknown time  . diltiazem (CARDIZEM CD) 120 MG 24 hr capsule TAKE 1 CAPSULE BY MOUTH EVERY DAY (Patient taking differently: Take 120 mg by mouth daily. ) 90 capsule 1 02/06/2020 at Unknown time  . Empagliflozin-metFORMIN HCl (SYNJARDY) 04-999 MG TABS Take 1 tablet by mouth daily. 90 tablet 1 02/06/2020 at Unknown time  . lisinopril-hydrochlorothiazide (ZESTORETIC) 10-12.5 MG tablet Take 1 tablet by mouth daily. 90 tablet 1 02/06/2020 at Unknown time    Inpatient Medications:  .  aspirin EC  81 mg Oral Daily  . atorvastatin  80 mg Oral q1800  . enoxaparin (LOVENOX) injection  30 mg Subcutaneous Q24H  . feeding supplement (ENSURE ENLIVE)  237 mL Oral BID BM  . insulin aspart  0-15 Units Subcutaneous TID WC  . insulin aspart  0-5 Units Subcutaneous QHS  . insulin aspart  3 Units Subcutaneous TID WC    Allergies: No Known Allergies  Social History   Socioeconomic History  . Marital status: Married    Spouse name: Not on file  .  Number of children: 4  . Years of education: 0  . Highest education level: 6th grade  Occupational History  . Occupation: Retired  Tobacco Use  . Smoking status: Former Smoker    Years: 50.00    Quit date: 12/28/2005    Years since quitting: 14.1  . Smokeless tobacco: Former Engineer, water and Sexual Activity  . Alcohol use: No    Alcohol/week: 0.0 standard drinks    Comment: a few drinks occasionally   . Drug use: No  . Sexual activity: Not on file  Other Topics Concern  . Not on file  Social History Narrative   Lives at home with wife, son, and grandchildren.   Social Determinants of Health   Financial Resource Strain: Low Risk   . Difficulty of Paying Living Expenses: Not hard at all  Food Insecurity: No Food Insecurity  . Worried About Programme researcher, broadcasting/film/video in the Last Year: Never true  . Ran Out of Food in the Last Year: Never true  Transportation Needs: No Transportation Needs  . Lack of Transportation (Medical): No  . Lack of Transportation (Non-Medical): No  Physical Activity: Inactive  . Days of Exercise per Week: 0 days  . Minutes of Exercise per Session: 0 min  Stress: No Stress Concern Present  . Feeling of Stress : Not at all  Social Connections:   . Frequency of Communication with Friends and Family: Not on file  . Frequency of Social Gatherings with Friends and Family: Not on file  . Attends Religious Services: Not on file  . Active Member of Clubs or Organizations: Not on file  . Attends Banker Meetings: Not on file  . Marital Status: Not on file  Intimate Partner Violence:   . Fear of Current or Ex-Partner: Not on file  . Emotionally Abused: Not on file  . Physically Abused: Not on file  . Sexually Abused: Not on file     No family history on file.   Review of Systems: All other systems reviewed and are otherwise negative except as noted above.  Physical Exam: Vitals:   02/07/20 0145 02/07/20 0249 02/07/20 0403 02/07/20 0436  BP:  (!) 144/66   121/74  Pulse: (!) 53   61  Resp: 18   15  Temp:    98.1 F (36.7 C)  TempSrc:    Oral  SpO2: 95%   97%  Weight:  66.7 kg    Height:   5\' 5"  (1.651 m)     GEN- The patient is well appearing, alert and oriented x 3 today.   HEENT: normocephalic, atraumatic; sclera clear, conjunctiva pink; hearing intact; oropharynx clear; neck supple Lungs- Clear to ausculation bilaterally, normal work of breathing.  No wheezes, rales, rhonchi Heart- Regular rate and rhythm, no murmurs, rubs or gallops GI- soft, non-tender, non-distended, bowel sounds present Extremities- no clubbing, cyanosis, or edema; DP/PT/radial pulses 2+ bilaterally MS- no  significant deformity or atrophy Skin- warm and dry, no rash or lesion Psych- euthymic mood, full affect Neuro- strength and sensation are intact  Labs:   Lab Results  Component Value Date   WBC 11.9 (H) 02/07/2020   HGB 12.6 (L) 02/07/2020   HCT 39.0 02/07/2020   MCV 80.9 02/07/2020   PLT 222 02/07/2020    Recent Labs  Lab 02/06/20 2217  NA 139  K 4.6  CL 102  CO2 23  BUN 20  CREATININE 1.64*  CALCIUM 9.4  PROT 8.0  BILITOT 1.1  ALKPHOS 91  ALT 28  AST 36  GLUCOSE 181*      Radiology/Studies: CT HEAD WO CONTRAST  Result Date: 02/06/2020 CLINICAL DATA:  Altered mental status EXAM: CT HEAD WITHOUT CONTRAST TECHNIQUE: Contiguous axial images were obtained from the base of the skull through the vertex without intravenous contrast. COMPARISON:  07/16/2013 FINDINGS: Brain: Old right parietal and occipital infarcts. There is atrophy and chronic small vessel disease changes. No acute intracranial abnormality. Specifically, no hemorrhage, hydrocephalus, mass lesion, acute infarction, or significant intracranial injury. Vascular: No hyperdense vessel or unexpected calcification. Skull: No acute calvarial abnormality. Sinuses/Orbits: Visualized paranasal sinuses and mastoids clear. Orbital soft tissues unremarkable. Other: None  IMPRESSION: Old left parietal and occipital infarcts. Atrophy, chronic microvascular disease. No acute intracranial abnormality. Electronically Signed   By: Charlett Nose M.D.   On: 02/06/2020 21:48   DG Chest Port 1 View  Result Date: 02/06/2020 CLINICAL DATA:  Weakness EXAM: PORTABLE CHEST 1 VIEW COMPARISON:  08/01/2013 FINDINGS: Heart and mediastinal contours are within normal limits. No focal opacities or effusions. No acute bony abnormality. IMPRESSION: No active disease. Electronically Signed   By: Charlett Nose M.D.   On: 02/06/2020 22:34   ECHOCARDIOGRAM COMPLETE  Result Date: 02/07/2020    ECHOCARDIOGRAM REPORT   Patient Name:   Juan [name] Date of Exam: 02/07/2020 Medical Rec #:  [id number]  Height:       65.0 in Accession #:    [id number] Weight:       147.0 lb Date of Birth:  Aug 28, 1951   BSA:          1.74 m Patient Age:    68 years   BP:           121/74 mmHg Patient Gender: M          HR:           61 bpm. Exam Location:  Inpatient Procedure: 2D Echo Indications:     Heartblock I45.9  History:         Patient has prior history of Echocardiogram examinations, most                  recent 07/20/2013. Risk Factors:Diabetes, Hypertension,                  Dyslipidemia and Former Smoker. History of embolic stroke.  Sonographer:     Leeroy Bock Turrentine Referring Phys:  [id number] Christy Sartorius NARENDRA Diagnosing Phys: Armanda Magic MD IMPRESSIONS  1. Left ventricular ejection fraction, by estimation, is 65 to 70%. The left ventricle has normal function. The left ventrical has no regional wall motion abnormalities. Left ventricular diastolic parameters were normal.  2. Right ventricular systolic function is normal. The right ventricular size is normal. Tricuspid regurgitation signal is inadequate for assessing PA pressure.  3. Trivial mitral valve regurgitation.  4. The aortic valve is normal in structure and function. Aortic valve regurgitation is not visualized.  No aortic stenosis is present.  5. The inferior vena  cava is normal in size with greater than 50% respiratory variability, suggesting right atrial pressure of 3 mmHg. Normal biventricular function without evidence of hemodynamically significant valvular heart disease. Marland Kitchen FINDINGS  Left Ventricle: Left ventricular ejection fraction, by estimation, is 65 to 70%. The left ventricle has normal function. The left ventricle has no regional wall motion abnormalities. The left ventricular internal cavity size was normal in size. There is  no left ventricular hypertrophy. Left ventricular diastolic parameters were normal. Right Ventricle: The right ventricular size is normal. No increase in right ventricular wall thickness. Right ventricular systolic function is normal. Tricuspid regurgitation signal is inadequate for assessing PA pressure. Left Atrium: Left atrial size was normal in size. Right Atrium: Right atrial size was normal in size. Pericardium: There is no evidence of pericardial effusion. Mitral Valve: The mitral valve is normal in structure and function. Normal mobility of the mitral valve leaflets. Trivial mitral valve regurgitation. No evidence of mitral valve stenosis. Tricuspid Valve: The tricuspid valve is normal in structure. Tricuspid valve regurgitation is not demonstrated. No evidence of tricuspid stenosis. Aortic Valve: The aortic valve is normal in structure and function. Aortic valve regurgitation is not visualized. No aortic stenosis is present. Pulmonic Valve: The pulmonic valve was normal in structure. Pulmonic valve regurgitation is trivial. No evidence of pulmonic stenosis. Aorta: The aortic root is normal in size and structure. Venous: The inferior vena cava is normal in size with greater than 50% respiratory variability, suggesting right atrial pressure of 3 mmHg. The inferior vena cava and the hepatic vein show a normal flow pattern. IAS/Shunts: No atrial level shunt detected by color flow Doppler.  LEFT VENTRICLE PLAX 2D LVIDd:         4.70 cm   Diastology LVIDs:         3.00 cm  LV e' lateral:   16.30 cm/s LV PW:         1.00 cm  LV E/e' lateral: 8.2 LV IVS:        1.00 cm  LV e' medial:    10.20 cm/s LVOT diam:     2.10 cm  LV E/e' medial:  13.0 LV SV:         97.33 ml LV SV Index:   38.31 LVOT Area:     3.46 cm  RIGHT VENTRICLE RV S prime:     12.70 cm/s TAPSE (M-mode): 2.3 cm LEFT ATRIUM             Index       RIGHT ATRIUM           Index LA diam:        3.40 cm 1.96 cm/m  RA Area:     14.40 cm LA Vol (A2C):   45.8 ml 26.39 ml/m RA Volume:   33.50 ml  19.30 ml/m LA Vol (A4C):   27.2 ml 15.67 ml/m LA Biplane Vol: 36.5 ml 21.03 ml/m  AORTIC VALVE LVOT Vmax:   100.00 cm/s LVOT Vmean:  72.500 cm/s LVOT VTI:    0.281 m  AORTA Ao Root diam: 2.80 cm MITRAL VALVE MV Area (PHT): 4.68 cm              SHUNTS MV Decel Time: 162 msec              Systemic VTI:  0.28 m MV E velocity: 133.00 cm/s 103 cm/s  Systemic Diam: 2.10 cm MV A  velocity: 92.40 cm/s  70.3 cm/s MV E/A ratio:  1.44        1.5 Fransico Him MD Electronically signed by Fransico Him MD Signature Date/Time: 02/07/2020/9:01:02 AM    Final (Updated)     EKG:2/10 at 0430 shows 2:1 AV block with a PVC and rate of 49 bpm.  (personally reviewed)  TELEMETRY: 2:1 AV block in 40-50s, occasional PVCs (personally reviewed)  Assessment/Plan: 1.  2:1 AV block Last dose of diltiazem yesterday am Echo today shows normal EF at 65-70%.  No reversible cause of his AV block has been found.  We discussed risks and benefits of pacemaker implantation with he and his family at length. Pt verbalizes understanding, and agrees to proceed, but first wants his wife to understand as well. His daughter will speak to her.  We will plan for pacemaker implantation at the next available time this afternoon.   For questions or updates, please contact [facility] Please consult [email or web address] for contact info under Cardiology/STEMI.  Jacalyn Lefevre, PA-C  02/07/2020 11:27 AM  EP  Attending  Patient seen and examined. Agree with the findings as noted above. The patient presents with Juan [name] syncope and 2:1 AV block. He was on low dose cardizem but has had persistent heart block despite his last dose over 30 hours ago. His exam reveals a healthy appearing man, NAD, RRR and clear lungs and no edema.  I have discussed the indications/risks/benefits/goals/expectations of PPM insertion and he wishes to proceed.   Mikle Bosworth.D.

## 2020-02-07 NOTE — ED Notes (Signed)
Admitting MD at bedside. Son [name] on phone to translate.

## 2020-02-07 NOTE — Progress Notes (Signed)
   Subjective: Patient notes he felt lightheaded and dizzy and fell in the kitchen when getting up and trying to walk to his room. He feels okay this morning, denies current lightheadedness. No history of chest pain, nausea/vomiting or recent illness. No prior episodes. Discussed possibility of pacemaker placement given EKG findings. All questions and concerns addressed.   Has been in the [place] since 1992. No recent travel in last 10 years. Denies prior helminth infection.    Objective:  Vital signs in last 24 hours: Vitals:   02/07/20 0145 02/07/20 0249 02/07/20 0403 02/07/20 0436  BP: (!) 144/66   121/74  Pulse: (!) 53   61  Resp: 18   15  Temp:    98.1 F (36.7 C)  TempSrc:    Oral  SpO2: 95%   97%  Weight:  66.7 kg    Height:   5\' 5"  (1.651 m)    Gen: NAD, not ill appearing HEENT: /AT, moist mucous membranes CV: Bradycardic, normal s1 , s2 Pulmonary: CTAB, no respiratory distress Abd: Soft, NT, ND MSK: Extremities warm,no LEE  Assessment/Plan:  Active Problems:   Postural dizziness with presyncope  Juan [name] is a 69 year old male with significant past medical history of hypertension, CKD, type 2 diabetes, and CVA who presents after presyncopal fall from standing.  Presyncope  Likely cardiogenic in setting of 2/2 Mobitz II . EF 65-70% with no wall motion abnormalities.  P:  Cardiology consulted, greatly appreciate recommendation -Pacer pads place incase patient goes into complete heart block - holding home diltiazem - Continue tele   Persistent eosinophilia Patient noted to have elevated absolute eosinophil count dating back to 2016. Blood smear 07/2711/2020 noted to have eosinophilia. Tryptase and vitamin B12 ordered were nl , making a hematological disorder less likely.  - Denies any known helminth infections, in 12/2019 since 1992 and has not traveled outside of country .  - labs this morning show eosinophilia resolved  - Repeat CBC with diff   Type II DM:  Home  regimen empagliflozin-Metformin.  Last A1c 2/8 was 8.1 -NovoLog 3 units 3 times daily with meals - SSI-M + qhs  History of CVA:  Pt has residual R sided weakness -Continue home atorvastatin 80 mg and aspirin 81 mg daily  Prior to Admission Living Arrangement:home Anticipated Discharge Location:TBD Barriers to Discharge: further workup Dispo: Anticipated discharge TBD.   1993, MD PGY1  See Attending Attestation Note for Final Recommendations.

## 2020-02-07 NOTE — Progress Notes (Signed)
[facility] Attending  Case discussed with Dr. Santos-Sanchez at the time of the visit.  We reviewed the resident's history and exam and pertinent patient test results.  I agree with the assessment, diagnosis, and plan of care documented in the resident's note.  Alexander Raines, M.D., Ph.D.  

## 2020-02-07 NOTE — Assessment & Plan Note (Signed)
Noted patient has had eosinophilia since 2014. CBC has been repeated multiple times confirming it. Blood smear also showed eosinophilia. He denies infectious symptoms, chest pain, respiratory symptoms, abdominal pain, N/V, new neurological deficits (does have hx of CVA in 2014), and new rashes or lesions. He is [place], but has been in the [place] for over 20 years and has not traveled outside the country since then.  He has had no recent changes in medications or changes in his diet. Denies any other exposures. His physical exam today is unremarkable. No HSM.   Broad ddx: parasitic infection, benign eosinophilia, HES which should be ruled out. Other than CKD he does not have evidence of other end organ damage, especially GI, heart/lungs, neuro, and skin which are the ones most commonly affected in HES. His CKD could also be due to uncontrolled HTN and T2DM. Have attempted to obtain other studies such as echo and PFTs for further evaluation but unfortunately patient has not shown up as he does not understand where he needs to go due to language barrier.   - CBC w/ diff, CMP, B12, and tryptase  - Will need strongyloides IgG to r/o parasitic infection  - Will call hematology for further recommendations

## 2020-02-08 ENCOUNTER — Other Ambulatory Visit: Payer: Self-pay | Admitting: Internal Medicine

## 2020-02-08 ENCOUNTER — Encounter (HOSPITAL_COMMUNITY): Payer: Self-pay | Admitting: Internal Medicine

## 2020-02-08 ENCOUNTER — Inpatient Hospital Stay (HOSPITAL_COMMUNITY): Payer: PPO

## 2020-02-08 DIAGNOSIS — Z950 Presence of cardiac pacemaker: Secondary | ICD-10-CM

## 2020-02-08 DIAGNOSIS — D7219 Other eosinophilia: Secondary | ICD-10-CM

## 2020-02-08 DIAGNOSIS — N189 Chronic kidney disease, unspecified: Secondary | ICD-10-CM

## 2020-02-08 LAB — GLUCOSE, CAPILLARY
Glucose-Capillary: 168 mg/dL — ABNORMAL HIGH (ref 70–99)
Glucose-Capillary: 371 mg/dL — ABNORMAL HIGH (ref 70–99)

## 2020-02-08 LAB — BASIC METABOLIC PANEL
Anion gap: 10 (ref 5–15)
BUN: 24 mg/dL — ABNORMAL HIGH (ref 8–23)
CO2: 26 mmol/L (ref 22–32)
Calcium: 9.7 mg/dL (ref 8.9–10.3)
Chloride: 102 mmol/L (ref 98–111)
Creatinine, Ser: 1.55 mg/dL — ABNORMAL HIGH (ref 0.61–1.24)
GFR calc Af Amer: 52 mL/min — ABNORMAL LOW (ref >60)
GFR calc non Af Amer: 45 mL/min — ABNORMAL LOW (ref >60)
Glucose, Bld: 213 mg/dL — ABNORMAL HIGH (ref 70–99)
Potassium: 4.6 mmol/L (ref 3.5–5.1)
Sodium: 138 mmol/L (ref 135–145)

## 2020-02-08 LAB — CBC WITH DIFFERENTIAL/PLATELET
Abs Immature Granulocytes: 0.01 10*3/uL (ref 0.00–0.07)
Basophils Absolute: 0.1 10*3/uL (ref 0.0–0.1)
Basophils Relative: 1 %
Eosinophils Absolute: 1.9 10*3/uL — ABNORMAL HIGH (ref 0.0–0.5)
Eosinophils Relative: 18 %
HCT: 39.8 % (ref 39.0–52.0)
Hemoglobin: 13 g/dL (ref 13.0–17.0)
Immature Granulocytes: 0 %
Lymphocytes Relative: 25 %
Lymphs Abs: 2.6 10*3/uL (ref 0.7–4.0)
MCH: 26.3 pg (ref 26.0–34.0)
MCHC: 32.7 g/dL (ref 30.0–36.0)
MCV: 80.4 fL (ref 80.0–100.0)
Monocytes Absolute: 0.8 10*3/uL (ref 0.1–1.0)
Monocytes Relative: 7 %
Neutro Abs: 5.1 10*3/uL (ref 1.7–7.7)
Neutrophils Relative %: 49 %
Platelets: 209 10*3/uL (ref 150–400)
RBC: 4.95 MIL/uL (ref 4.22–5.81)
RDW: 14.1 % (ref 11.5–15.5)
WBC: 10.4 10*3/uL (ref 4.0–10.5)
nRBC: 0 % (ref 0.0–0.2)

## 2020-02-08 NOTE — Discharge Instructions (Signed)
After Your ICD (Implantable Cardiac Defibrillator)   . Do not lift your arm above shoulder height for 1 week after your procedure. After 7 days, you may progress as below.     Wednesday February 14, 2020  Thursday February 15, 2020 Friday February 16, 2020 Saturday February 17, 2020   . Do not lift, push, pull, or carry anything over 10 pounds with the affected arm until 6 weeks (Wednesday March 20, 2020 ) after your procedure.   . Do not drive until you have been seen for your wound check, or as long as instructed by your healthcare provider.   . Monitor your defibrillator site for redness, swelling, and drainage. Call the device clinic at [phone] if you experience these symptoms or fever/chills.  . If your incision is sealed with Steri-strips or staples, you may shower 10 days after your procedure or when told by your provider. Do not remove the steri-strips or let the shower hit directly on your site. You may wash around your site with soap and water. If your incision is closed with Dermabond/Surgical glue. You may shower 1 day after your pacemaker implant and wash around the site with soap and water. Avoid lotions, ointments, or perfumes over your incision until it is well-healed.  . You may use a hot tub or a pool AFTER your wound check appointment if the incision is completely closed.  . Your ICD  may be MRI compatible. This will be discussed at your next office visit/wound check.   . Your ICD is designed to protect you from life threatening heart rhythms. Because of this, you may receive a shock.   o 1 shock with no symptoms:  Call the office during business hours. o 1 shock with symptoms (chest pain, chest pressure, dizziness, lightheadedness, shortness of breath, overall feeling unwell):  Call 911. o If you experience 2 or more shocks in 24 hours:  Call 911. o If you receive a shock, you should not drive for 6 months per the [place] DMV IF you receive appropriate therapy from your  ICD.   . ICD Alerts:  Some alerts are vibratory and others beep. These are NOT emergencies. Please call our office to let us know. If this occurs at night or on weekends, it can wait until the next business day. Send a remote transmission.  . If your device is capable of reading fluid status (for heart failure), you will be offered monthly monitoring to review this with you.   . Remote monitoring is used to monitor your ICD from home. This monitoring is scheduled every 91 days by our office. It allows [place] to keep an eye on the functioning of your device to ensure it is working properly. You will routinely see your Electrophysiologist annually (more often if necessary).    Cardioverter Defibrillator Implantation, Care After This sheet gives you information about how to care for yourself after your procedure. Your health care provider may also give you more specific instructions. If you have problems or questions, contact your health care provider. What can I expect after the procedure? After the procedure, it is common to have:  Some pain. It may last a few days.  A slight bump over the skin where the device was placed. Sometimes, it is possible to feel the device under the skin. This is normal.  During the months and years after your procedure, your health care provider will check the device, the leads, and the battery every few months. Eventually, when  the battery is low, the device will be replaced.  You should receive your defibrillator ID card for your new device in the next 4-8 weeks.  Follow these instructions at home: Medicines  Take over-the-counter and prescription medicines only as told by your health care provider.  If you were prescribed an antibiotic medicine, take it as told by your health care provider. Do not stop taking the antibiotic even if you start to feel better. Incision care        Follow instructions from your health care provider about how to take care of  your incision area. Make sure you: ? Leave stitches (sutures), skin glue, or adhesive strips in place. These skin closures may need to stay in place for 2 weeks or longer. If adhesive strip edges start to loosen and curl up, you may trim the loose edges. Do not remove adhesive strips completely unless your health care provider tells you to do that.  Check your incision area every day for signs of infection. Check for: ? More redness, swelling, or pain. ? More fluid or blood. ? Warmth. ? Pus or a bad smell.  Do not use lotions or ointments near the incision area unless told by your health care provider.  Keep the incision area clean and dry for 7 days after the procedure or for as long as told by your health care provider. It takes several weeks for the incision site to heal completely.  Do not take baths, swim, or use a hot tub until your health care provider approves. Activity  Try to walk a little every day. Exercising is important after this procedure. Also, use your shoulder on the side of the defibrillator in daily tasks that do not require a lot of motion.  For at least 1 week: ? Do not lift your upper arm above your shoulders. This means no tennis, golf, or swimming for this period of time. If you tend to sleep with your arm above your head, use a restraint to prevent this during sleep.  For at least 6 weeks: ? Avoid sudden jerking, pulling, or chopping movements that pull your upper arm far away from your body.  Ask your health care provider when you may go back to work.  Check with your health care provider before you start to drive or play sports. Electric and magnetic fields  Tell all health care providers that you have a defibrillator. This may prevent them from giving you an MRI scan because strong magnets are used for that test.  If you must pass through a metal detector, quickly walk through it. Do not stop under the detector, and do not stand near it.  Avoid places or  objects that have a strong electric or magnetic field, including: ? Airport Herbalist. At the airport, let officials know that you have a defibrillator. Your defibrillator ID card will let you be checked in a way that is safe for you and will not damage your defibrillator. Also, do not let a security person wave a magnetic wand near your defibrillator. That can make it stop working. ? Power plants. ? Large electrical generators. ? Anti-theft systems or electronic article surveillance (EAS). ? Radiofrequency transmission towers, such as cell phone and radio towers.  Do not use amateur (ham) radio equipment or electric (arc) welding torches. Some devices are safe to use if held at least 12 inches (30 cm) from your defibrillator. These include power tools, lawn mowers, and speakers. If you are unsure  if something is safe to use, ask your health care provider.  Do not use MP3 player headphones. They have magnets.  You may safely use electric blankets, heating pads, computers, and microwave ovens.  When using your cell phone, hold it to the ear that is on the opposite side from the defibrillator. Do not leave your cell phone in a pocket over the defibrillator. General instructions  Follow diet instructions from your health care provider, if this applies.  Always keep your defibrillator ID card with you. The card should list the implant date, device model, and manufacturer. Consider wearing a medical alert bracelet or necklace.  Have your defibrillator checked every 3-6 months or as often as told by your health care provider. Most defibrillators last for 4-8 years.  Keep all follow-up visits as told by your health care provider. This is important for your health care provider to make sure your chest is healing the way it should. Ask your health care provider when you should come back to have your stitches or staples taken out. Contact a health care provider if:  You gain weight  suddenly.  Your legs or feet swell more than they have before.  It feels like your heart is fluttering or skipping beats (heart palpitations).  You have more redness, swelling, or pain around your incision.  You have more fluid or blood coming from your incision.  Your incision feels warm to the touch.  You have pus or a bad smell coming from your incision.  You have a fever. Get help right away if:  You have chest pain.  You feel more than one shock.  You feel more short of breath than you have felt before.  You feel more light-headed than you have felt before.  Your incision starts to open up. This information is not intended to replace advice given to you by your health care provider. Make sure you discuss any questions you have with your health care provider.   Juan [name],  You received a pacemaker for your abnormal heart rhythm and Dr.Taylor has schedule outpatient follow-up. In addition we will send you out with supplies to collect a stool sample. Please return this to the Internal Medicine Clinic.   The clinic will call to schedule a follow up appointment.

## 2020-02-08 NOTE — Plan of Care (Signed)
  Problem: Education: Goal: Knowledge of General Education information will improve Description: Including pain rating scale, medication(s)/side effects and non-pharmacologic comfort measures Outcome: Completed/Met   Problem: Clinical Measurements: Goal: Will remain free from infection Outcome: Completed/Met   Problem: Pain Managment: Goal: General experience of comfort will improve Outcome: Completed/Met   Problem: Safety: Goal: Ability to remain free from injury will improve Outcome: Completed/Met   Problem: Education: Goal: Knowledge of cardiac device and self-care will improve Outcome: Completed/Met Goal: Ability to safely manage health related needs after discharge will improve Outcome: Completed/Met Goal: Individualized Educational Video(s) Outcome: Completed/Met   Problem: Cardiac: Goal: Ability to achieve and maintain adequate cardiopulmonary perfusion will improve Outcome: Completed/Met

## 2020-02-08 NOTE — Discharge Summary (Signed)
Name: Juan [name] MRN: [id number] DOB: August 31, 1951 69 y.o. PCP: Burna Cash, MD  Date of Admission: 02/06/2020  8:27 PM Date of Discharge: 02/06/2020 Attending Physician: Earl Lagos, MD  Discharge Diagnosis: 1. Atrioventricular block requiring pacemaker   Discharge Medications: Allergies as of 02/08/2020   No Known Allergies     Medication List    STOP taking these medications   diltiazem 120 MG 24 hr capsule Commonly known as: CARDIZEM CD     TAKE these medications   aspirin 81 MG EC tablet Take 1 tablet (81 mg total) by mouth daily.   atorvastatin 80 MG tablet Commonly known as: LIPITOR TAKE 1 TABLET BY MOUTH EVERY DAY   lisinopril-hydrochlorothiazide 10-12.5 MG tablet Commonly known as: ZESTORETIC Take 1 tablet by mouth daily.   Synjardy 04-999 MG Tabs Generic drug: Empagliflozin-metFORMIN HCl Take 1 tablet by mouth daily.       Disposition and follow-up:   Juan [name] was discharged from [facility] in Stable condition.  At the hospital follow up visit please address:  1.    AV block - came in after presyncopal event, now s/p pacemaker placement - check site for infection, evaluate for symptoms  Persistent Eosinophilia - PCP has began workup, patient went out with supplies for stool sample to bring in to visit  2.  Labs / imaging needed at time of follow-up: na  3.  Pending labs/ test needing follow-up: na  Follow-up Appointments: Follow-up Information    Marinus Maw, MD Follow up on 05/29/2020.   Specialty: Cardiology Why: at 230 pm for 3 month pacemaker check Contact information: [address] [place] [address] [phone]        [facility] Follow up on 02/20/2020.   Why: at 330 pm for post pacemaker check Contact information: [address] [place] [address] [phone]          Hospital Course  by problem list: 1.  Presyncope  Presyncope from 2nd degree heart block now s/p pacemaker. Follow up schedule with Dr.Taylor.   Persistent eosinophilia Patient noted to have elevated absolute eosinophil count dating back to 2016. Blood smear 07/2711/2020 noted to have eosinophilia. Tryptase and vitamin B12 ordered were nl , making a hematological disorder less likely. Denies any known helminth infections, in [place] since 1992 and has not traveled outside of country . Given supplies to collect stool sample and return to [facility].   Type II DM:  Home regimen empagliflozin-Metformin.  Last A1c 2/8 was 8.1 - Patient at borderline GFR for his home regimen with follow up on Monday. Will put this in discharge to discuss.    Discharge Vitals:   BP 119/77 (BP Location: Right Arm)   Pulse (!) 101   Temp 98 F (36.7 C) (Oral)   Resp 17   Ht 5\' 5"  (1.651 m)   Wt 70.1 kg   SpO2 97%   BMI 25.72 kg/m   Pertinent Labs, Studies, and Procedures:  CBC Latest Ref Rng & Units 02/08/2020 02/07/2020 02/07/2020  WBC 4.0 - 10.5 K/uL 10.4 10.2 11.9(H)  Hemoglobin 13.0 - 17.0 g/dL 04/06/2020 24.2 12.6(L)  Hematocrit 39.0 - 52.0 % 39.8 42.1 39.0  Platelets 150 - 400 K/uL 209 207 222   . BMP Latest Ref Rng & Units 02/08/2020 02/06/2020 02/05/2020  Glucose 70 - 99 mg/dL 04/04/2020) 614(E) 315(Q)  BUN 8 - 23 mg/dL 008(Q) 20 16  Creatinine 0.61 -  1.24 mg/dL 1.55(H) 1.64(H) 1.55(H)  BUN/Creat Ratio 10 - 24 - - 10  Sodium 135 - 145 mmol/L 138 139 139  Potassium 3.5 - 5.1 mmol/L 4.6 4.6 5.0  Chloride 98 - 111 mmol/L 102 102 99  CO2 22 - 32 mmol/L 26 23 24   Calcium 8.9 - 10.3 mg/dL 9.7 9.4 10.1    EXAM: CT HEAD WITHOUT CONTRAST  TECHNIQUE: Contiguous axial images were obtained from the base of the skull through the vertex without intravenous contrast.  COMPARISON:  07/16/2013  FINDINGS: Brain: Old right parietal and occipital infarcts. There is atrophy and chronic small vessel disease changes. No acute  intracranial abnormality. Specifically, no hemorrhage, hydrocephalus, mass lesion, acute infarction, or significant intracranial injury.  Vascular: No hyperdense vessel or unexpected calcification.  Skull: No acute calvarial abnormality.  Sinuses/Orbits: Visualized paranasal sinuses and mastoids clear. Orbital soft tissues unremarkable.  Other: None  IMPRESSION: Old left parietal and occipital infarcts.  Atrophy, chronic microvascular disease.  No acute intracranial abnormality.   Discharge Instructions:   Signed:  Tamsen Snider, MD PGY1

## 2020-02-08 NOTE — Progress Notes (Addendum)
Electrophysiology Rounding Note  Patient Name: Juan [name] Date of Encounter: 02/08/2020  Electrophysiologist: Ladona Ridgel   Subjective   The patient is doing well today.  At this time, the patient denies chest pain, shortness of breath, or any new concerns.  Inpatient Medications    Scheduled Meds:  aspirin EC  81 mg Oral Daily   atorvastatin  80 mg Oral q1800   enoxaparin (LOVENOX) injection  30 mg Subcutaneous Q24H   feeding supplement (ENSURE ENLIVE)  237 mL Oral BID BM   insulin aspart  0-15 Units Subcutaneous TID WC   insulin aspart  0-5 Units Subcutaneous QHS   insulin aspart  3 Units Subcutaneous TID WC   multivitamin with minerals  1 tablet Oral Daily   Continuous Infusions:   ceFAZolin (ANCEF) IV 1 g (02/08/20 0122)   PRN Meds: acetaminophen **OR** acetaminophen, ondansetron (ZOFRAN) IV   Vital Signs    Vitals:   02/07/20 1505 02/07/20 1520 02/07/20 1944 02/08/20 0414  BP: (!) 147/81 (!) 148/81 131/82 119/77  Pulse:  78 96 (!) 101  Resp: 13 13 17 17   Temp:   98.5 F (36.9 C) 98 F (36.7 C)  TempSrc:   Oral Oral  SpO2:  100% 97% 97%  Weight:    70.1 kg  Height:        Intake/Output Summary (Last 24 hours) at 02/08/2020 0809 Last data filed at 02/08/2020 0122 Gross per 24 hour  Intake 770 ml  Output 650 ml  Net 120 ml   Filed Weights   02/07/20 0249 02/08/20 0414  Weight: 66.7 kg 70.1 kg    Physical Exam    GEN- The patient is well appearing, alert and oriented x 3 today.   Head- normocephalic, atraumatic Eyes-  Sclera clear, conjunctiva pink Ears- hearing intact Oropharynx- clear Neck- supple Lungs- Clear to ausculation bilaterally, normal work of breathing Heart- Regular rate and rhythm (paced) GI- soft, NT, ND, + BS Extremities- no clubbing, cyanosis, or edema Skin- no rash or lesion Psych- euthymic mood, full affect Neuro- strength and sensation are intact  Labs    CBC Recent Labs    02/07/20 1503 02/08/20 0401  WBC 10.2 10.4    NEUTROABS 4.5 5.1  HGB 13.5 13.0  HCT 42.1 39.8  MCV 80.8 80.4  PLT 207 209   Basic Metabolic Panel Recent Labs    04/07/20 2217 02/08/20 0401  NA 139 138  K 4.6 4.6  CL 102 102  CO2 23 26  GLUCOSE 181* 213*  BUN 20 24*  CREATININE 1.64* 1.55*  CALCIUM 9.4 9.7   Liver Function Tests Recent Labs    02/05/20 1131 02/06/20 2217  AST 20 36  ALT 24 28  ALKPHOS 109 91  BILITOT 0.3 1.1  PROT 7.8 8.0  ALBUMIN 4.1 3.8   Hemoglobin A1C Recent Labs    02/05/20 1108  HGBA1C 8.1*   Thyroid Function Tests Recent Labs    02/06/20 2056  TSH 0.889    Telemetry    Sinus rhythm with V pacing  (personally reviewed)  Radiology    CT HEAD WO CONTRAST  Result Date: 02/06/2020 CLINICAL DATA:  Altered mental status EXAM: CT HEAD WITHOUT CONTRAST TECHNIQUE: Contiguous axial images were obtained from the base of the skull through the vertex without intravenous contrast. COMPARISON:  07/16/2013 FINDINGS: Brain: Old right parietal and occipital infarcts. There is atrophy and chronic small vessel disease changes. No acute intracranial abnormality. Specifically, no hemorrhage, hydrocephalus, mass lesion, acute infarction,  or significant intracranial injury. Vascular: No hyperdense vessel or unexpected calcification. Skull: No acute calvarial abnormality. Sinuses/Orbits: Visualized paranasal sinuses and mastoids clear. Orbital soft tissues unremarkable. Other: None IMPRESSION: Old left parietal and occipital infarcts. Atrophy, chronic microvascular disease. No acute intracranial abnormality. Electronically Signed   By: Charlett Nose M.D.   On: 02/06/2020 21:48   EP PPM/ICD IMPLANT  Result Date: 02/07/2020 CONCLUSIONS:  1. 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  2. No early apparent complications.       Lewayne Bunting, MD 02/07/2020 2:49 PM   DG Chest Port 1 View  Result Date: 02/06/2020 CLINICAL DATA:   Weakness EXAM: PORTABLE CHEST 1 VIEW COMPARISON:  08/01/2013 FINDINGS: Heart and mediastinal contours are within normal limits. No focal opacities or effusions. No acute bony abnormality. IMPRESSION: No active disease. Electronically Signed   By: Charlett Nose M.D.   On: 02/06/2020 22:34   ECHOCARDIOGRAM COMPLETE  Result Date: 02/07/2020    ECHOCARDIOGRAM REPORT   Patient Name:   Juan [name] Date of Exam: 02/07/2020 Medical Rec #:  [id number]  Height:       65.0 in Accession #:    [id number] Weight:       147.0 lb Date of Birth:  20-Mar-1951   BSA:          1.74 m Patient Age:    68 years   BP:           121/74 mmHg Patient Gender: M          HR:           61 bpm. Exam Location:  Inpatient Procedure: 2D Echo Indications:     Heartblock I45.9  History:         Patient has prior history of Echocardiogram examinations, most                  recent 07/20/2013. Risk Factors:Diabetes, Hypertension,                  Dyslipidemia and Former Smoker. History of embolic stroke.  Sonographer:     Leeroy Bock Turrentine Referring Phys:  [id number] Christy Sartorius NARENDRA Diagnosing Phys: Armanda Magic MD IMPRESSIONS  1. Left ventricular ejection fraction, by estimation, is 65 to 70%. The left ventricle has normal function. The left ventrical has no regional wall motion abnormalities. Left ventricular diastolic parameters were normal.  2. Right ventricular systolic function is normal. The right ventricular size is normal. Tricuspid regurgitation signal is inadequate for assessing PA pressure.  3. Trivial mitral valve regurgitation.  4. The aortic valve is normal in structure and function. Aortic valve regurgitation is not visualized. No aortic stenosis is present.  5. The inferior vena cava is normal in size with greater than 50% respiratory variability, suggesting right atrial pressure of 3 mmHg. Normal biventricular function without evidence of hemodynamically significant valvular heart disease. Marland Kitchen FINDINGS  Left Ventricle: Left ventricular  ejection fraction, by estimation, is 65 to 70%. The left ventricle has normal function. The left ventricle has no regional wall motion abnormalities. The left ventricular internal cavity size was normal in size. There is  no left ventricular hypertrophy. Left ventricular diastolic parameters were normal. Right Ventricle: The right ventricular size is normal. No increase in right ventricular wall thickness. Right ventricular systolic function is normal. Tricuspid regurgitation signal is inadequate for assessing PA pressure. Left Atrium: Left atrial size was normal in size. Right Atrium: Right  atrial size was normal in size. Pericardium: There is no evidence of pericardial effusion. Mitral Valve: The mitral valve is normal in structure and function. Normal mobility of the mitral valve leaflets. Trivial mitral valve regurgitation. No evidence of mitral valve stenosis. Tricuspid Valve: The tricuspid valve is normal in structure. Tricuspid valve regurgitation is not demonstrated. No evidence of tricuspid stenosis. Aortic Valve: The aortic valve is normal in structure and function. Aortic valve regurgitation is not visualized. No aortic stenosis is present. Pulmonic Valve: The pulmonic valve was normal in structure. Pulmonic valve regurgitation is trivial. No evidence of pulmonic stenosis. Aorta: The aortic root is normal in size and structure. Venous: The inferior vena cava is normal in size with greater than 50% respiratory variability, suggesting right atrial pressure of 3 mmHg. The inferior vena cava and the hepatic vein show a normal flow pattern. IAS/Shunts: No atrial level shunt detected by color flow Doppler.  LEFT VENTRICLE PLAX 2D LVIDd:         4.70 cm  Diastology LVIDs:         3.00 cm  LV e' lateral:   16.30 cm/s LV PW:         1.00 cm  LV E/e' lateral: 8.2 LV IVS:        1.00 cm  LV e' medial:    10.20 cm/s LVOT diam:     2.10 cm  LV E/e' medial:  13.0 LV SV:         97.33 ml LV SV Index:   38.31 LVOT Area:      3.46 cm  RIGHT VENTRICLE RV S prime:     12.70 cm/s TAPSE (M-mode): 2.3 cm LEFT ATRIUM             Index       RIGHT ATRIUM           Index LA diam:        3.40 cm 1.96 cm/m  RA Area:     14.40 cm LA Vol (A2C):   45.8 ml 26.39 ml/m RA Volume:   33.50 ml  19.30 ml/m LA Vol (A4C):   27.2 ml 15.67 ml/m LA Biplane Vol: 36.5 ml 21.03 ml/m  AORTIC VALVE LVOT Vmax:   100.00 cm/s LVOT Vmean:  72.500 cm/s LVOT VTI:    0.281 m  AORTA Ao Root diam: 2.80 cm MITRAL VALVE MV Area (PHT): 4.68 cm              SHUNTS MV Decel Time: 162 msec              Systemic VTI:  0.28 m MV E velocity: 133.00 cm/s 103 cm/s  Systemic Diam: 2.10 cm MV A velocity: 92.40 cm/s  70.3 cm/s MV E/A ratio:  1.44        1.5 Fransico Him MD Electronically signed by Fransico Him MD Signature Date/Time: 02/07/2020/9:01:02 AM    Final (Updated)      Assessment & Plan    1.  Mobitz II Doing well s/p pacemaker  Normal device function, wound looks good CXR with leads in stable position. No obvious ptx Routine wound care and follow up  (entered in AVS)  Electrophysiology team to see as needed while here. Please call with questions.  For questions or updates, please contact [place] Please consult [email or web address] for contact info under Cardiology/STEMI.  Signed, Chanetta Marshall, NP  02/08/2020, 8:09 AM   EP Attending  Patient seen and examined. Agree with above. The patient  is s/p PPM insertion and his CXR and device interrogation demonstrate satisfactory lead position and device function. He is stable for DC from my perspective. We will arrange followup.   Leonia Reeves.D.

## 2020-02-08 NOTE — Progress Notes (Addendum)
   Subjective:  Patient notes feeling better this morning. Has not walked yet. Discussed need to get stool test to test for parasite infection given lab abnormality. Has been in the [place] since 1992. No recent travel in last 10 years for patient or family. No abdominal pain or trouble swallowing. All questions and concerns addressed.    Objective:  Vital signs in last 24 hours: Vitals:   02/07/20 1505 02/07/20 1520 02/07/20 1944 02/08/20 0414  BP: (!) 147/81 (!) 148/81 131/82 119/77  Pulse:  78 96 (!) 101  Resp: 13 13 17 17   Temp:   98.5 F (36.9 C) 98 F (36.7 C)  TempSrc:   Oral Oral  SpO2:  100% 97% 97%  Weight:    70.1 kg  Height:       Gen: NAD, not ill appearing HEENT: [place]/AT, moist mucous membranes CV: Regular rate and rhythm  normal s1 , s2 Pulmonary: CTAB, no respiratory distress Abd: Soft, NT, ND MSK: Extremities warm,no LEE  Assessment/Plan:  Active Problems:   Postural dizziness with presyncope   Mobitz type 2 second degree heart block  Juan [name] is a 69 year old male with significant past medical history of hypertension, CKD, type 2 diabetes, and CVA who presents after presyncopal fall from standing.  Presyncope  Presyncope from 2nd degree heart block now s/p pacemaker. Follow up schedule with Dr.Taylor.   Persistent eosinophilia Patient noted to have elevated absolute eosinophil count dating back to 2016. Blood smear 07/2711/2020 noted to have eosinophilia. Tryptase and vitamin B12 ordered were nl , making a hematological disorder less likely.  - Denies any known helminth infections, in 12/2019 since 1992 and has not traveled outside of country .  - Given supplies to collect stool sample and return to [facility]. - Follow up scheduled for further workup as outpatient.    Type II DM:  Home regimen empagliflozin-Metformin.  Last A1c 2/8 was 8.1 - Patient at borderline GFR for his home regimen with follow up on Monday. Will put this in discharge to discuss.   History of  CVA:  Pt has residual R sided weakness -Continue home atorvastatin 80 mg and aspirin 81 mg daily  Prior to Admission Living Arrangement:home Anticipated Discharge Location: home Barriers to Discharge: further workup Dispo: Anticipated discharge today.  Thursday, MD PGY1  See Attending Attestation Note for Final Recommendations.

## 2020-02-09 LAB — PATHOLOGIST SMEAR REVIEW

## 2020-02-12 ENCOUNTER — Other Ambulatory Visit: Payer: Self-pay

## 2020-02-12 ENCOUNTER — Encounter: Payer: Self-pay | Admitting: Internal Medicine

## 2020-02-12 ENCOUNTER — Ambulatory Visit (INDEPENDENT_AMBULATORY_CARE_PROVIDER_SITE_OTHER): Payer: PPO | Admitting: Internal Medicine

## 2020-02-12 VITALS — BP 141/61 | HR 75 | Temp 98.4°F | Wt 145.9 lb

## 2020-02-12 DIAGNOSIS — N183 Chronic kidney disease, stage 3 unspecified: Secondary | ICD-10-CM

## 2020-02-12 DIAGNOSIS — E1151 Type 2 diabetes mellitus with diabetic peripheral angiopathy without gangrene: Secondary | ICD-10-CM

## 2020-02-12 DIAGNOSIS — Z950 Presence of cardiac pacemaker: Secondary | ICD-10-CM

## 2020-02-12 DIAGNOSIS — Z7984 Long term (current) use of oral hypoglycemic drugs: Secondary | ICD-10-CM | POA: Diagnosis not present

## 2020-02-12 DIAGNOSIS — D721 Eosinophilia, unspecified: Secondary | ICD-10-CM

## 2020-02-12 DIAGNOSIS — I441 Atrioventricular block, second degree: Secondary | ICD-10-CM

## 2020-02-12 DIAGNOSIS — D7219 Other eosinophilia: Secondary | ICD-10-CM | POA: Diagnosis not present

## 2020-02-12 DIAGNOSIS — E1122 Type 2 diabetes mellitus with diabetic chronic kidney disease: Secondary | ICD-10-CM

## 2020-02-12 MED ORDER — JANUMET 50-500 MG PO TABS: 1.0000 | ORAL_TABLET | Freq: Two times a day (BID) | ORAL | 3 refills | Status: DC

## 2020-02-12 NOTE — Patient Instructions (Addendum)
Juan [name],   I am glad you are feeling better after being released from the hospital. You  Have an appointment with the heart doctor on 05/29/2020 at 2:30 PM.  [address] [place] [address] [phone]  We made some changes to your diabetes medications because of the issues with your kidneys.  You will stop taking Synjardy completely and start taking a medication called Janumet 1 tablet twice daily.   Please come back to see me in 3 months to follow-up on your diabetes.  You can also come in sooner if you need to.  - Dr. Evelene Croon

## 2020-02-12 NOTE — Progress Notes (Signed)
   CC: Hospital follow-up, T2DM  HPI:  [name] is a 69 y.o. year-old male with PMH listed below who presents to clinic for hospital follow, T2DM. Please see problem based assessment and plan for further details.   Past Medical History:  Diagnosis Date  . Carotid artery occlusion   . Diabetes mellitus without complication (HCC)    Type II, takes metformin per patient(11/01/15)  . Hyperlipemia   . Hypertension   . Stroke [facility]) 06/2013   Left side- weak, unable to make a fist   Review of Systems:   Review of Systems  Constitutional: Negative for chills, fever and weight loss.  Respiratory: Negative for shortness of breath.   Cardiovascular: Negative for chest pain and palpitations.  Neurological: Negative for dizziness and headaches.    Physical Exam:  Vitals:   02/12/20 1433  BP: (!) 141/61  Pulse: 75  Temp: 98.4 F (36.9 C)  TempSrc: Oral  SpO2: 99%  Weight: 145 lb 14.4 oz (66.2 kg)    General: Well-appearing elderly male in no acute distress Cardiac: regular rate and rhythm, nl S1/S2, no murmurs, rubs or gallops Pulm: CTAB, no wheezes or crackles, no increased work of breathing on room air  Derm: PPM site dressing c/d/i, no signs of infection present   Assessment & Plan:   See Encounters Tab for problem based charting.  Patient discussed with Dr. Rogelia Boga

## 2020-02-12 NOTE — Assessment & Plan Note (Signed)
Please see note from 02/07/2020 for further details.  Ova and parasites ordered by MD during admission.  Patient brought in stool sample.  Follow-up.

## 2020-02-12 NOTE — Assessment & Plan Note (Signed)
Patient was admitted to the hospital 2/9-2/11 for presyncope and was found to have second-degree AV block requiring placement of a pacemaker.  He has been doing well since being discharged.  Pacemaker site is dressed and dressing appears clean, dry, and intact.  He is experiencing very mild tenderness around the area, but no signs of infection present.  He has a follow-up appointment with cardiology in June 2021.  I gave him the information for this appointment.

## 2020-02-12 NOTE — Assessment & Plan Note (Addendum)
Patient presents for T2DM follow-up. He is on Synjardy and compliant.  However, he has CKD 3 with a GFR of 45 and we made adjustments to his regimen today.  Unable to prescribe invokamet as it is not covered by insurance. Marcelline Deist is contraindicated in GFR < 45. Will prescribe Janumet 50-500 mg BID (prefer combo pill to help with compliance) and STOP Synjardy. Follow up in 3 months.

## 2020-02-14 LAB — OVA AND PARASITE EXAMINATION

## 2020-02-14 NOTE — Progress Notes (Signed)
Internal Medicine Clinic Attending  Case discussed with Dr. Santos at the time of the visit.  We reviewed the resident's history and exam and pertinent patient test results.  I agree with the assessment, diagnosis, and plan of care documented in the resident's note.    

## 2020-02-16 ENCOUNTER — Telehealth: Payer: Self-pay | Admitting: *Deleted

## 2020-02-16 NOTE — Telephone Encounter (Signed)
Received call from Hewitt Shorts, [facility]- stated they were referred by [facility] to see pt about palliative care. She wants to be sure this will be ok with pt's PCP before proceeding? Thanks

## 2020-02-17 NOTE — Telephone Encounter (Signed)
I am not sure why palliative care has been consulted for this patient. I would not expect him to have a short life expectancy based on his medical problems. He also does not have any active symptoms from medical illnesses. In addition to this, he has always declined any assistance we send to his house. He has declined home health PT/OT and [facility] several times.

## 2020-02-19 NOTE — Telephone Encounter (Signed)
Chauncey Mann, [facility] - read her Dr Dicky Doe response; stated thank you for letting her know and will close the case.

## 2020-02-20 ENCOUNTER — Ambulatory Visit (INDEPENDENT_AMBULATORY_CARE_PROVIDER_SITE_OTHER): Payer: PPO | Admitting: *Deleted

## 2020-02-20 ENCOUNTER — Other Ambulatory Visit: Payer: Self-pay

## 2020-02-20 DIAGNOSIS — I441 Atrioventricular block, second degree: Secondary | ICD-10-CM

## 2020-02-20 NOTE — Patient Instructions (Signed)
Call device clinic if you have any redness drainage or swelling at incision site.  [phone]

## 2020-02-22 LAB — CUP PACEART INCLINIC DEVICE CHECK
Battery Remaining Longevity: 142 mo
Battery Voltage: 3.21 V
Brady Statistic AP VP Percent: 2.02 %
Brady Statistic AP VS Percent: 0.01 %
Brady Statistic AS VP Percent: 96.67 %
Brady Statistic AS VS Percent: 1.31 %
Brady Statistic RA Percent Paced: 2.33 %
Brady Statistic RV Percent Paced: 98.69 %
Date Time Interrogation Session: 20210223155900
Implantable Lead Implant Date: 20210210
Implantable Lead Implant Date: 20210210
Implantable Lead Location: 753859
Implantable Lead Location: 753860
Implantable Lead Model: 3830
Implantable Lead Model: 5076
Implantable Pulse Generator Implant Date: 20210210
Lead Channel Impedance Value: 342 Ohm
Lead Channel Impedance Value: 399 Ohm
Lead Channel Impedance Value: 551 Ohm
Lead Channel Impedance Value: 608 Ohm
Lead Channel Pacing Threshold Amplitude: 0.5 V
Lead Channel Pacing Threshold Amplitude: 1 V
Lead Channel Pacing Threshold Pulse Width: 0.4 ms
Lead Channel Pacing Threshold Pulse Width: 0.4 ms
Lead Channel Sensing Intrinsic Amplitude: 8.125 mV
Lead Channel Setting Pacing Amplitude: 3.5 V
Lead Channel Setting Pacing Amplitude: 3.5 V
Lead Channel Setting Pacing Pulse Width: 0.4 ms
Lead Channel Setting Sensing Sensitivity: 2.8 mV

## 2020-02-22 NOTE — Progress Notes (Signed)
Wound check appointment with interpreter present for entire visit. Steri-strips removed. Wound without redness or edema. Incision edges approximated, wound well healed. Normal device function. Thresholds, sensing, and impedances consistent with implant measurements. Device programmed at 3.5V/auto capture programmed on for extra safety margin until 3 month visit. Histogram distribution appropriate for patient and level of activity. No mode switches or high ventricular rates noted. Patient educated about wound care, arm mobility, lifting restrictions. Next remote scheduled for 05/10/20 and every 3 months there after. ROV with Dr Ladona Ridgel 05/29/20.

## 2020-03-01 ENCOUNTER — Telehealth: Payer: Self-pay | Admitting: *Deleted

## 2020-03-01 DIAGNOSIS — E1151 Type 2 diabetes mellitus with diabetic peripheral angiopathy without gangrene: Secondary | ICD-10-CM

## 2020-03-01 MED ORDER — FREESTYLE LITE TEST VI STRP: ORAL_STRIP | 3 refills | Status: DC

## 2020-03-01 MED ORDER — FREESTYLE LANCETS MISC: 3 refills | Status: DC

## 2020-03-01 MED ORDER — FREESTYLE LITE DEVI: 0 refills | Status: DC

## 2020-03-01 NOTE — Telephone Encounter (Signed)
Health team advantage caseworker, RN calls and is transferred to donnaP

## 2020-03-01 NOTE — Telephone Encounter (Signed)
Health team advantage nurse called with patients son on the line to make an appointment for self monitoring instruction. Request referral, meter and supplies. Appointment made for 03/06/2020

## 2020-03-06 ENCOUNTER — Ambulatory Visit: Payer: PPO | Admitting: Dietician

## 2020-03-06 ENCOUNTER — Telehealth: Payer: Self-pay | Admitting: Dietician

## 2020-03-06 ENCOUNTER — Encounter: Payer: PPO | Admitting: Dietician

## 2020-03-06 NOTE — Telephone Encounter (Signed)
Used interpreter # [id number] to leave a message for patient to call to reschedule his missed diabetes education appointment.

## 2020-03-18 NOTE — Addendum Note (Signed)
Addended by: Neomia Dear on: 03/18/2020 12:39 PM   Modules accepted: Orders

## 2020-04-23 ENCOUNTER — Encounter: Payer: Self-pay | Admitting: *Deleted

## 2020-04-30 ENCOUNTER — Other Ambulatory Visit: Payer: Self-pay

## 2020-04-30 DIAGNOSIS — E1151 Type 2 diabetes mellitus with diabetic peripheral angiopathy without gangrene: Secondary | ICD-10-CM

## 2020-04-30 DIAGNOSIS — I10 Essential (primary) hypertension: Secondary | ICD-10-CM

## 2020-04-30 DIAGNOSIS — E1169 Type 2 diabetes mellitus with other specified complication: Secondary | ICD-10-CM

## 2020-04-30 MED ORDER — ATORVASTATIN CALCIUM 80 MG PO TABS: 80.0000 mg | ORAL_TABLET | Freq: Every day | ORAL | 3 refills | Status: DC

## 2020-04-30 MED ORDER — FREESTYLE LITE TEST VI STRP: ORAL_STRIP | 3 refills | Status: DC

## 2020-04-30 MED ORDER — LISINOPRIL-HYDROCHLOROTHIAZIDE 10-12.5 MG PO TABS: 1.0000 | ORAL_TABLET | Freq: Every day | ORAL | 3 refills | Status: DC

## 2020-04-30 MED ORDER — FREESTYLE LANCETS MISC: 3 refills | Status: DC

## 2020-04-30 MED ORDER — ASPIRIN 81 MG PO TBEC: 81.0000 mg | DELAYED_RELEASE_TABLET | Freq: Every day | ORAL | 3 refills | Status: DC

## 2020-04-30 MED ORDER — JANUMET 50-500 MG PO TABS: 1.0000 | ORAL_TABLET | Freq: Two times a day (BID) | ORAL | 3 refills | Status: DC

## 2020-04-30 MED ORDER — FREESTYLE LITE DEVI: 0 refills | Status: DC

## 2020-04-30 NOTE — Telephone Encounter (Signed)
He has never been Rx'd dilt that I can see.  Overdue for appt. Pls sch PCP or [facility] appt DM / HTN

## 2020-04-30 NOTE — Telephone Encounter (Signed)
aspirin 81 MG EC tablet     atorvastatin (LIPITOR) 80 MG tablet     Blood Glucose Monitoring Suppl (FREESTYLE LITE) DEVI     glucose blood (FREESTYLE LITE) test strip     Lancets (FREESTYLE) lancets     lisinopril-hydrochlorothiazide (ZESTORETIC) 10-12.5 MG tablet     sitaGLIPtin-metformin (JANUMET) 50-500 MG tablet  And diltiazem to be filled @      [id number] - [place], [place] - [address] [phone] (Phone) [phone] (Fax)

## 2020-05-06 ENCOUNTER — Encounter: Payer: PPO | Admitting: Internal Medicine

## 2020-05-08 ENCOUNTER — Telehealth: Payer: Self-pay | Admitting: Internal Medicine

## 2020-05-08 NOTE — Telephone Encounter (Signed)
Has been sent to pharmacy, [facility], ready for pick up they will call pt

## 2020-05-08 NOTE — Telephone Encounter (Signed)
Pt son is requesting that pt gets an order a diabetic meter; [phone]

## 2020-05-09 ENCOUNTER — Telehealth: Payer: Self-pay | Admitting: Dietician

## 2020-05-09 NOTE — Telephone Encounter (Signed)
Left a message to Dr. Laruth Bouchard office  requesting Eye exam results form referral 01/23/2019

## 2020-05-10 ENCOUNTER — Ambulatory Visit (INDEPENDENT_AMBULATORY_CARE_PROVIDER_SITE_OTHER): Payer: PPO | Admitting: *Deleted

## 2020-05-10 DIAGNOSIS — I441 Atrioventricular block, second degree: Secondary | ICD-10-CM | POA: Diagnosis not present

## 2020-05-10 LAB — CUP PACEART REMOTE DEVICE CHECK
Battery Remaining Longevity: 119 mo
Battery Voltage: 3.18 V
Brady Statistic AP VP Percent: 7.99 %
Brady Statistic AP VS Percent: 1.68 %
Brady Statistic AS VP Percent: 79.67 %
Brady Statistic AS VS Percent: 10.67 %
Brady Statistic RA Percent Paced: 10.74 %
Brady Statistic RV Percent Paced: 87.65 %
Date Time Interrogation Session: 20210514060323
Implantable Lead Implant Date: 20210210
Implantable Lead Implant Date: 20210210
Implantable Lead Location: 753859
Implantable Lead Location: 753860
Implantable Lead Model: 3830
Implantable Lead Model: 5076
Implantable Pulse Generator Implant Date: 20210210
Lead Channel Impedance Value: 342 Ohm
Lead Channel Impedance Value: 399 Ohm
Lead Channel Impedance Value: 589 Ohm
Lead Channel Impedance Value: 646 Ohm
Lead Channel Pacing Threshold Amplitude: 0.5 V
Lead Channel Pacing Threshold Amplitude: 1.25 V
Lead Channel Pacing Threshold Pulse Width: 0.4 ms
Lead Channel Pacing Threshold Pulse Width: 0.4 ms
Lead Channel Sensing Intrinsic Amplitude: 31.625 mV
Lead Channel Sensing Intrinsic Amplitude: 31.625 mV
Lead Channel Sensing Intrinsic Amplitude: 7.75 mV
Lead Channel Sensing Intrinsic Amplitude: 7.75 mV
Lead Channel Setting Pacing Amplitude: 3 V
Lead Channel Setting Pacing Amplitude: 3 V
Lead Channel Setting Pacing Pulse Width: 0.4 ms
Lead Channel Setting Sensing Sensitivity: 2.8 mV

## 2020-05-13 ENCOUNTER — Encounter: Payer: Self-pay | Admitting: Internal Medicine

## 2020-05-13 ENCOUNTER — Encounter: Payer: PPO | Admitting: Internal Medicine

## 2020-05-13 NOTE — Progress Notes (Signed)
Remote pacemaker transmission.   

## 2020-05-29 ENCOUNTER — Encounter: Payer: PPO | Admitting: Internal Medicine

## 2020-08-09 ENCOUNTER — Ambulatory Visit (INDEPENDENT_AMBULATORY_CARE_PROVIDER_SITE_OTHER): Payer: PPO | Admitting: *Deleted

## 2020-08-09 DIAGNOSIS — I441 Atrioventricular block, second degree: Secondary | ICD-10-CM | POA: Diagnosis not present

## 2020-08-11 LAB — CUP PACEART REMOTE DEVICE CHECK
Battery Remaining Longevity: 127 mo
Battery Voltage: 3.13 V
Brady Statistic AP VP Percent: 3.67 %
Brady Statistic AP VS Percent: 0.17 %
Brady Statistic AS VP Percent: 92.67 %
Brady Statistic AS VS Percent: 3.49 %
Brady Statistic RA Percent Paced: 4.28 %
Brady Statistic RV Percent Paced: 96.34 %
Date Time Interrogation Session: 20210813221944
Implantable Lead Implant Date: 20210210
Implantable Lead Implant Date: 20210210
Implantable Lead Location: 753859
Implantable Lead Location: 753860
Implantable Lead Model: 3830
Implantable Lead Model: 5076
Implantable Pulse Generator Implant Date: 20210210
Lead Channel Impedance Value: 304 Ohm
Lead Channel Impedance Value: 380 Ohm
Lead Channel Impedance Value: 456 Ohm
Lead Channel Impedance Value: 570 Ohm
Lead Channel Pacing Threshold Amplitude: 0.5 V
Lead Channel Pacing Threshold Amplitude: 0.625 V
Lead Channel Pacing Threshold Pulse Width: 0.4 ms
Lead Channel Pacing Threshold Pulse Width: 0.4 ms
Lead Channel Sensing Intrinsic Amplitude: 28 mV
Lead Channel Sensing Intrinsic Amplitude: 28 mV
Lead Channel Sensing Intrinsic Amplitude: 6.125 mV
Lead Channel Sensing Intrinsic Amplitude: 6.125 mV
Lead Channel Setting Pacing Amplitude: 1.5 V
Lead Channel Setting Pacing Amplitude: 2.5 V
Lead Channel Setting Pacing Pulse Width: 0.4 ms
Lead Channel Setting Sensing Sensitivity: 2.8 mV

## 2020-08-13 NOTE — Progress Notes (Signed)
Remote pacemaker transmission.   

## 2020-08-21 ENCOUNTER — Other Ambulatory Visit: Payer: Self-pay

## 2020-08-21 ENCOUNTER — Emergency Department (HOSPITAL_COMMUNITY)
Admission: EM | Admit: 2020-08-21 | Discharge: 2020-08-21 | Disposition: A | Discharge status: Home / Self Care | Payer: PPO | Attending: Emergency Medicine | Admitting: Emergency Medicine

## 2020-08-21 ENCOUNTER — Emergency Department (HOSPITAL_COMMUNITY): Payer: PPO

## 2020-08-21 DIAGNOSIS — R0602 Shortness of breath: Secondary | ICD-10-CM | POA: Diagnosis not present

## 2020-08-21 DIAGNOSIS — R0781 Pleurodynia: Secondary | ICD-10-CM | POA: Insufficient documentation

## 2020-08-21 DIAGNOSIS — J90 Pleural effusion, not elsewhere classified: Secondary | ICD-10-CM | POA: Diagnosis not present

## 2020-08-21 DIAGNOSIS — R079 Chest pain, unspecified: Secondary | ICD-10-CM | POA: Diagnosis not present

## 2020-08-21 DIAGNOSIS — Z5321 Procedure and treatment not carried out due to patient leaving prior to being seen by health care provider: Secondary | ICD-10-CM | POA: Insufficient documentation

## 2020-08-21 DIAGNOSIS — J189 Pneumonia, unspecified organism: Secondary | ICD-10-CM | POA: Diagnosis not present

## 2020-08-21 MED ORDER — ACETAMINOPHEN 325 MG PO TABS
650.0000 mg | ORAL_TABLET | Freq: Once | ORAL | Status: AC | PRN
  Administered 2020-08-21: 650 mg via ORAL
  Filled 2020-08-21: qty 2

## 2020-08-21 NOTE — ED Triage Notes (Signed)
Pt states that he has had R lower ribcage pain and SOB since yesterday. Pt speaks [place] and is from [place]. Denies covid contacts or vaccination. Alert and oriented.

## 2020-08-21 NOTE — ED Notes (Signed)
No answer from lobby  

## 2020-08-21 NOTE — ED Notes (Signed)
No answer from lobby x3 

## 2020-08-22 ENCOUNTER — Other Ambulatory Visit: Payer: Self-pay

## 2020-08-22 ENCOUNTER — Ambulatory Visit (HOSPITAL_COMMUNITY)
Admission: EM | Admit: 2020-08-22 | Discharge: 2020-08-22 | Disposition: A | Discharge status: Home / Self Care | Payer: PPO | Attending: Family Medicine | Admitting: Family Medicine

## 2020-08-22 ENCOUNTER — Encounter (HOSPITAL_COMMUNITY): Payer: Self-pay

## 2020-08-22 ENCOUNTER — Ambulatory Visit (INDEPENDENT_AMBULATORY_CARE_PROVIDER_SITE_OTHER): Payer: PPO

## 2020-08-22 DIAGNOSIS — E1122 Type 2 diabetes mellitus with diabetic chronic kidney disease: Secondary | ICD-10-CM | POA: Diagnosis not present

## 2020-08-22 DIAGNOSIS — E1151 Type 2 diabetes mellitus with diabetic peripheral angiopathy without gangrene: Secondary | ICD-10-CM | POA: Insufficient documentation

## 2020-08-22 DIAGNOSIS — I441 Atrioventricular block, second degree: Secondary | ICD-10-CM | POA: Diagnosis not present

## 2020-08-22 DIAGNOSIS — Z7982 Long term (current) use of aspirin: Secondary | ICD-10-CM | POA: Diagnosis not present

## 2020-08-22 DIAGNOSIS — Z950 Presence of cardiac pacemaker: Secondary | ICD-10-CM | POA: Diagnosis not present

## 2020-08-22 DIAGNOSIS — J181 Lobar pneumonia, unspecified organism: Secondary | ICD-10-CM | POA: Diagnosis not present

## 2020-08-22 DIAGNOSIS — Z8673 Personal history of transient ischemic attack (TIA), and cerebral infarction without residual deficits: Secondary | ICD-10-CM | POA: Diagnosis not present

## 2020-08-22 DIAGNOSIS — E785 Hyperlipidemia, unspecified: Secondary | ICD-10-CM | POA: Insufficient documentation

## 2020-08-22 DIAGNOSIS — Z79899 Other long term (current) drug therapy: Secondary | ICD-10-CM | POA: Diagnosis not present

## 2020-08-22 DIAGNOSIS — I129 Hypertensive chronic kidney disease with stage 1 through stage 4 chronic kidney disease, or unspecified chronic kidney disease: Secondary | ICD-10-CM | POA: Insufficient documentation

## 2020-08-22 DIAGNOSIS — J90 Pleural effusion, not elsewhere classified: Secondary | ICD-10-CM | POA: Insufficient documentation

## 2020-08-22 DIAGNOSIS — R109 Unspecified abdominal pain: Secondary | ICD-10-CM

## 2020-08-22 DIAGNOSIS — D721 Eosinophilia, unspecified: Secondary | ICD-10-CM | POA: Insufficient documentation

## 2020-08-22 DIAGNOSIS — J189 Pneumonia, unspecified organism: Secondary | ICD-10-CM | POA: Diagnosis not present

## 2020-08-22 DIAGNOSIS — Z87891 Personal history of nicotine dependence: Secondary | ICD-10-CM | POA: Insufficient documentation

## 2020-08-22 DIAGNOSIS — N189 Chronic kidney disease, unspecified: Secondary | ICD-10-CM | POA: Insufficient documentation

## 2020-08-22 DIAGNOSIS — Z7984 Long term (current) use of oral hypoglycemic drugs: Secondary | ICD-10-CM | POA: Insufficient documentation

## 2020-08-22 DIAGNOSIS — Z20822 Contact with and (suspected) exposure to covid-19: Secondary | ICD-10-CM | POA: Insufficient documentation

## 2020-08-22 LAB — POCT URINALYSIS DIPSTICK, ED / UC
Bilirubin Urine: NEGATIVE
Glucose, UA: NEGATIVE mg/dL
Hgb urine dipstick: NEGATIVE
Ketones, ur: NEGATIVE mg/dL
Leukocytes,Ua: NEGATIVE
Nitrite: NEGATIVE
Protein, ur: 30 mg/dL — AB
Specific Gravity, Urine: 1.01 (ref 1.005–1.030)
Urobilinogen, UA: 0.2 mg/dL (ref 0.0–1.0)
pH: 5.5 (ref 5.0–8.0)

## 2020-08-22 LAB — SARS CORONAVIRUS 2 (TAT 6-24 HRS): SARS Coronavirus 2: NEGATIVE

## 2020-08-22 MED ORDER — AMOXICILLIN-POT CLAVULANATE 875-125 MG PO TABS: 1.0000 | ORAL_TABLET | Freq: Two times a day (BID) | ORAL | 0 refills | Status: DC

## 2020-08-22 MED ORDER — AZITHROMYCIN 250 MG PO TABS: 250.0000 mg | ORAL_TABLET | Freq: Every day | ORAL | 0 refills | Status: DC

## 2020-08-22 NOTE — Discharge Instructions (Addendum)
Take the antibiotics as prescribed for pneumonia You can take Tylenol for pain and fevers. Covid swab pending Rest, drink plenty of fluids.  Follow up as needed for continued or worsening symptoms

## 2020-08-22 NOTE — ED Triage Notes (Signed)
Pt presents with right sided rib cage pain x 3 days. States pain is worse when taking a deep breath. Denies fever, chills, vomiting.

## 2020-08-23 NOTE — ED Provider Notes (Signed)
[facility]    CSN: [id number] Arrival date & time: 08/22/20  1422      History   Chief Complaint Chief Complaint  Patient presents with  . Flank Pain    HPI Juan [name] is a 69 y.o. male.   Patient is a 69 year old male with past medical history of diabetes, hypertension, hyperlipidemia, stroke.  He presents today with right sided rib cage pain, pain with breathing x3 days.  Symptoms have been constant.  He has had cough and fatigue.  Pain with taking a deep breath.  No fever, chills, body aches or night sweats.  Patient does have low-grade fever here today and sats are 95%.     Past Medical History:  Diagnosis Date  . Carotid artery occlusion   . Diabetes mellitus without complication (HCC)    Type II, takes metformin per patient(11/01/15)  . Hyperlipemia   . Hypertension   . Stroke [facility]) 06/2013   Left side- weak, unable to make a fist    Patient Active Problem List   Diagnosis Date Noted  . Mobitz type 2 second degree heart block 02/07/2020  . 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 Chronic kidney disease (CKD) 01/13/2019  . Medication management 09/14/2017  . Hypertension 07/27/2013  . Dyslipidemia associated with type 2 diabetes mellitus (HCC) 07/27/2013  . Carotid artery stenosis with cerebral infarction s/p left endarerectomy  07/20/2013  . History of embolic stroke (01/2024) 07/16/2013  . Type 2 diabetes mellitus with peripheral vascular disease (HCC) 07/16/2013    Past Surgical History:  Procedure Laterality Date  . CAROTID ENDARTERECTOMY Left 08-04-13  . ENDARTERECTOMY Left 08/04/2013   Procedure: Left Carotid Endarterectomy with hemashield patch angioplasty;  Surgeon: Larina Earthly, MD;  Location: [facility] OR;  Service: Vascular;  Laterality: Left;  . NECK SURGERY  2003   Cord Compression  . PACEMAKER IMPLANT N/A 02/07/2020   Procedure: PACEMAKER IMPLANT;  Surgeon: Marinus Maw, MD;  Location: [facility] INVASIVE CV LAB;  Service: Cardiovascular;  Laterality: N/A;  . TEE  WITHOUT CARDIOVERSION N/A 07/20/2013   Procedure: TRANSESOPHAGEAL ECHOCARDIOGRAM (TEE);  Surgeon: Wendall Stade, MD;  Location: [facility] ENDOSCOPY;  Service: Cardiovascular;  Laterality: N/A;       Home Medications    Prior to Admission medications   Medication Sig Start Date End Date Taking? Authorizing Provider  amoxicillin-clavulanate (AUGMENTIN) 875-125 MG tablet Take 1 tablet by mouth every 12 (twelve) hours. 08/22/20   Dahlia Byes A, NP  aspirin 81 MG EC tablet Take 1 tablet (81 mg total) by mouth daily. 04/30/20   Burns Spain, MD  atorvastatin (LIPITOR) 80 MG tablet Take 1 tablet (80 mg total) by mouth daily. 04/30/20   Burns Spain, MD  azithromycin (ZITHROMAX) 250 MG tablet Take 1 tablet (250 mg total) by mouth daily. Take first 2 tablets together, then 1 every day until finished. 08/22/20   Dahlia Byes A, NP  Blood Glucose Monitoring Suppl (FREESTYLE LITE) DEVI Use to check blood sugar up to 7 times a week 04/30/20   Burns Spain, MD  glucose blood (FREESTYLE LITE) test strip Check blood sugar up to 7 times a week as instructed 04/30/20   Burns Spain, MD  Lancets (FREESTYLE) lancets Check blood sugar up to 7 times a week as instructed 04/30/20   Burns Spain, MD  lisinopril-hydrochlorothiazide (ZESTORETIC) 10-12.5 MG tablet Take 1 tablet by mouth daily. 04/30/20   Burns Spain, MD  sitaGLIPtin-metformin (JANUMET) 50-500 MG tablet Take 1 tablet  by mouth 2 (two) times daily with a meal. 04/30/20   Burns Spain, MD    Family History History reviewed. No pertinent family history.  Social History Social History   Tobacco Use  . Smoking status: Former Smoker    Years: 50.00    Quit date: 12/28/2005    Years since quitting: 14.6  . Smokeless tobacco: Former Engineer, water Use Topics  . Alcohol use: No    Alcohol/week: 0.0 standard drinks    Comment: a few drinks occasionally   . Drug use: No     Allergies   Patient has no known  allergies.   Review of Systems Review of Systems   Physical Exam Triage Vital Signs ED Triage Vitals  Enc Vitals Group     BP 08/22/20 1539 131/71     Pulse Rate 08/22/20 1539 94     Resp 08/22/20 1539 18     Temp 08/22/20 1539 99.6 F (37.6 C)     Temp Source 08/22/20 1539 Oral     SpO2 08/22/20 1539 95 %     Weight --      Height --      Head Circumference --      Peak Flow --      Pain Score 08/22/20 1538 6     Pain Loc --      Pain Edu? --      Excl. in GC? --    No data found.  Updated Vital Signs BP 131/71 (BP Location: Right Arm)   Pulse 94   Temp 99.6 F (37.6 C) (Oral)   Resp 18   SpO2 95%   Visual Acuity Right Eye Distance:   Left Eye Distance:   Bilateral Distance:    Right Eye Near:   Left Eye Near:    Bilateral Near:     Physical Exam Vitals and nursing note reviewed.  Constitutional:      Appearance: Normal appearance.  HENT:     Head: Normocephalic and atraumatic.     Right Ear: Tympanic membrane and ear canal normal.     Left Ear: Tympanic membrane and ear canal normal.     Nose: Nose normal.     Mouth/Throat:     Pharynx: Oropharynx is clear.  Eyes:     Conjunctiva/sclera: Conjunctivae normal.  Cardiovascular:     Rate and Rhythm: Normal rate.  Pulmonary:     Effort: Pulmonary effort is normal.     Breath sounds: Normal breath sounds.     Comments: Pain with inspiration during exam Decreased lung sounds in right lower.  Musculoskeletal:        General: Normal range of motion.     Cervical back: Normal range of motion.  Skin:    General: Skin is warm and dry.  Neurological:     Mental Status: He is alert.  Psychiatric:        Mood and Affect: Mood normal.      UC Treatments / Results  Labs (all labs ordered are listed, but only abnormal results are displayed) Labs Reviewed  POCT URINALYSIS DIPSTICK, ED / UC - Abnormal; Notable for the following components:      Result Value   Protein, ur 30 (*)    All other  components within normal limits  SARS CORONAVIRUS 2 (TAT 6-24 HRS)    EKG   Radiology DG Chest 2 View  Result Date: 08/21/2020 CLINICAL DATA:  Shortness of breath and chest pain EXAM:  CHEST - 2 VIEW COMPARISON:  February 08, 2020 FINDINGS: There is a small right pleural effusion with ill-defined airspace opacity in the right base. The left lung is clear. The heart size and pulmonary vascularity are normal. No adenopathy. Pacemaker leads are attached to the right atrium and right ventricle. There is postoperative change in the lower cervical region. No appreciable pneumothorax. IMPRESSION: Ill-defined airspace opacity right base with small right pleural effusion. Suspect pneumonia in right base. Lungs elsewhere clear. Heart size normal. Pacemaker leads attached to right atrium and right ventricle. No adenopathy. Electronically Signed   By: Bretta Bang III M.D.   On: 08/21/2020 14:14   DG Abd Acute W/Chest  Result Date: 08/22/2020 CLINICAL DATA:  Abdominal pain EXAM: DG ABDOMEN ACUTE W/ 1V CHEST COMPARISON:  Chest x-ray 08/21/2020, 02/08/2020 FINDINGS: Single-view chest demonstrates left-sided pacing device as before. Surgical hardware in the cervical spine. Small possibly loculated right pleural effusion with adjacent airspace disease at the right base. Supine and upright views of the abdomen demonstrate no free air beneath the diaphragm. There is a nonobstructed bowel-gas pattern. No radiopaque calculi are visualized. IMPRESSION: Nonobstructed bowel-gas pattern. Small possibly loculated right pleural effusion with adjacent airspace disease at the right base, atelectasis versus pneumonia. Electronically Signed   By: Jasmine Pang M.D.   On: 08/22/2020 16:17    Procedures Procedures (including critical care time)  Medications Ordered in UC Medications - No data to display  Initial Impression / Assessment and Plan / UC Course  I have reviewed the triage vital signs and the nursing  notes.  Pertinent labs & imaging results that were available during my care of the patient were reviewed by me and considered in my medical decision making (see chart for details).     Community-acquired pneumonia of right lower lobe Also with small right pleural effusion. Treating with dual therapy with amoxicillin and azithromycin. Covid swab pending.  Tylenol for pain and fevers as needed. Rest, hydrate Follow up as needed for continued or worsening symptoms  Final Clinical Impressions(s) / UC Diagnoses   Final diagnoses:  Community acquired pneumonia of right lower lobe of lung     Discharge Instructions     Take the antibiotics as prescribed for pneumonia You can take Tylenol for pain and fevers. Covid swab pending Rest, drink plenty of fluids.  Follow up as needed for continued or worsening symptoms      ED Prescriptions    Medication Sig Dispense Auth. Provider   amoxicillin-clavulanate (AUGMENTIN) 875-125 MG tablet Take 1 tablet by mouth every 12 (twelve) hours. 14 tablet Mesha Schamberger A, NP   azithromycin (ZITHROMAX) 250 MG tablet Take 1 tablet (250 mg total) by mouth daily. Take first 2 tablets together, then 1 every day until finished. 6 tablet Dahlia Byes A, NP     PDMP not reviewed this encounter.   Janace Aris, NP 08/23/20 1354

## 2020-08-28 ENCOUNTER — Telehealth: Payer: Self-pay | Admitting: *Deleted

## 2020-08-28 NOTE — Telephone Encounter (Signed)
Yes, I agree. Thanks!

## 2020-08-28 NOTE — Telephone Encounter (Signed)
Son calls and states pt is having great abd pain, between umbilicus and pelvic area on R side, he doubles over sone states has hard time lifting R leg up to abd area. He denies fevers but does not have thermometer. He states pt calls out when he tries moving. He is ask to take pt to ED asap. He is reluctant but agreeable Do you agree?

## 2020-08-29 ENCOUNTER — Emergency Department (HOSPITAL_COMMUNITY): Payer: PPO

## 2020-08-29 ENCOUNTER — Other Ambulatory Visit: Payer: Self-pay

## 2020-08-29 ENCOUNTER — Emergency Department (HOSPITAL_COMMUNITY)
Admission: EM | Admit: 2020-08-29 | Discharge: 2020-08-29 | Disposition: A | Discharge status: Home / Self Care | Payer: PPO | Attending: Emergency Medicine | Admitting: Emergency Medicine

## 2020-08-29 ENCOUNTER — Encounter (HOSPITAL_COMMUNITY): Payer: Self-pay | Admitting: *Deleted

## 2020-08-29 DIAGNOSIS — J189 Pneumonia, unspecified organism: Secondary | ICD-10-CM | POA: Diagnosis not present

## 2020-08-29 DIAGNOSIS — R109 Unspecified abdominal pain: Secondary | ICD-10-CM | POA: Diagnosis not present

## 2020-08-29 DIAGNOSIS — E1169 Type 2 diabetes mellitus with other specified complication: Secondary | ICD-10-CM | POA: Diagnosis not present

## 2020-08-29 DIAGNOSIS — I2510 Atherosclerotic heart disease of native coronary artery without angina pectoris: Secondary | ICD-10-CM | POA: Diagnosis not present

## 2020-08-29 DIAGNOSIS — N189 Chronic kidney disease, unspecified: Secondary | ICD-10-CM | POA: Insufficient documentation

## 2020-08-29 DIAGNOSIS — J9811 Atelectasis: Secondary | ICD-10-CM | POA: Diagnosis not present

## 2020-08-29 DIAGNOSIS — Z7982 Long term (current) use of aspirin: Secondary | ICD-10-CM | POA: Diagnosis not present

## 2020-08-29 DIAGNOSIS — Z79899 Other long term (current) drug therapy: Secondary | ICD-10-CM | POA: Diagnosis not present

## 2020-08-29 DIAGNOSIS — J90 Pleural effusion, not elsewhere classified: Secondary | ICD-10-CM | POA: Diagnosis not present

## 2020-08-29 DIAGNOSIS — Z87891 Personal history of nicotine dependence: Secondary | ICD-10-CM | POA: Diagnosis not present

## 2020-08-29 DIAGNOSIS — Z950 Presence of cardiac pacemaker: Secondary | ICD-10-CM | POA: Diagnosis not present

## 2020-08-29 DIAGNOSIS — I700 Atherosclerosis of aorta: Secondary | ICD-10-CM | POA: Diagnosis not present

## 2020-08-29 DIAGNOSIS — I708 Atherosclerosis of other arteries: Secondary | ICD-10-CM | POA: Diagnosis not present

## 2020-08-29 DIAGNOSIS — Z7984 Long term (current) use of oral hypoglycemic drugs: Secondary | ICD-10-CM | POA: Insufficient documentation

## 2020-08-29 DIAGNOSIS — I129 Hypertensive chronic kidney disease with stage 1 through stage 4 chronic kidney disease, or unspecified chronic kidney disease: Secondary | ICD-10-CM | POA: Diagnosis not present

## 2020-08-29 DIAGNOSIS — N400 Enlarged prostate without lower urinary tract symptoms: Secondary | ICD-10-CM | POA: Diagnosis not present

## 2020-08-29 DIAGNOSIS — R079 Chest pain, unspecified: Secondary | ICD-10-CM | POA: Diagnosis not present

## 2020-08-29 DIAGNOSIS — N281 Cyst of kidney, acquired: Secondary | ICD-10-CM | POA: Diagnosis not present

## 2020-08-29 LAB — CBC
HCT: 35.4 % — ABNORMAL LOW (ref 39.0–52.0)
Hemoglobin: 10.9 g/dL — ABNORMAL LOW (ref 13.0–17.0)
MCH: 25.2 pg — ABNORMAL LOW (ref 26.0–34.0)
MCHC: 30.8 g/dL (ref 30.0–36.0)
MCV: 81.8 fL (ref 80.0–100.0)
Platelets: 497 10*3/uL — ABNORMAL HIGH (ref 150–400)
RBC: 4.33 MIL/uL (ref 4.22–5.81)
RDW: 13.8 % (ref 11.5–15.5)
WBC: 8.6 10*3/uL (ref 4.0–10.5)
nRBC: 0 % (ref 0.0–0.2)

## 2020-08-29 LAB — HEPATIC FUNCTION PANEL
ALT: 52 U/L — ABNORMAL HIGH (ref 0–44)
AST: 34 U/L (ref 15–41)
Albumin: 2.9 g/dL — ABNORMAL LOW (ref 3.5–5.0)
Alkaline Phosphatase: 93 U/L (ref 38–126)
Bilirubin, Direct: 0.1 mg/dL (ref 0.0–0.2)
Indirect Bilirubin: 0.4 mg/dL (ref 0.3–0.9)
Total Bilirubin: 0.5 mg/dL (ref 0.3–1.2)
Total Protein: 8.4 g/dL — ABNORMAL HIGH (ref 6.5–8.1)

## 2020-08-29 LAB — BASIC METABOLIC PANEL
Anion gap: 9 (ref 5–15)
BUN: 13 mg/dL (ref 8–23)
CO2: 26 mmol/L (ref 22–32)
Calcium: 9.4 mg/dL (ref 8.9–10.3)
Chloride: 103 mmol/L (ref 98–111)
Creatinine, Ser: 1.4 mg/dL — ABNORMAL HIGH (ref 0.61–1.24)
GFR calc Af Amer: 59 mL/min — ABNORMAL LOW (ref >60)
GFR calc non Af Amer: 51 mL/min — ABNORMAL LOW (ref >60)
Glucose, Bld: 125 mg/dL — ABNORMAL HIGH (ref 70–99)
Potassium: 4.6 mmol/L (ref 3.5–5.1)
Sodium: 138 mmol/L (ref 135–145)

## 2020-08-29 LAB — TROPONIN I (HIGH SENSITIVITY)
Troponin I (High Sensitivity): 7 ng/L (ref <18)
Troponin I (High Sensitivity): 5 ng/L (ref <18)

## 2020-08-29 LAB — LIPASE, BLOOD: Lipase: 43 U/L (ref 11–51)

## 2020-08-29 MED ORDER — SODIUM CHLORIDE 0.9 % IV BOLUS
1000.0000 mL | Freq: Once | INTRAVENOUS | Status: AC
  Administered 2020-08-29: 1000 mL via INTRAVENOUS

## 2020-08-29 MED ORDER — IOHEXOL 300 MG/ML  SOLN
100.0000 mL | Freq: Once | INTRAMUSCULAR | Status: AC | PRN
  Administered 2020-08-29: 100 mL via INTRAVENOUS

## 2020-08-29 MED ORDER — IOHEXOL 300 MG/ML  SOLN
50.0000 mL | Freq: Once | INTRAMUSCULAR | Status: AC | PRN
  Administered 2020-08-29: 50 mL via INTRAVENOUS

## 2020-08-29 NOTE — ED Provider Notes (Signed)
Medical screening examination/treatment/procedure(s) were conducted as a shared visit with non-physician practitioner(s) and myself.  I personally evaluated the patient during the encounter.  EKG Interpretation  Date/Time:  Thursday August 29 2020 09:09:35 EDT Ventricular Rate:  73 PR Interval:  164 QRS Duration: 152 QT Interval:  422 QTC Calculation: 464 R Axis:   -39 Text Interpretation: Atrial-sensed ventricular-paced rhythm with occasional Premature ventricular complexes Abnormal ECG agree. no sig change from previous Confirmed by Arby Barrette [phone]) on 08/29/2020 2:25:14 PM Patient reportedly has had several days of abdominal pain.  Symptoms have been right-sided.  Patient indicates the lateral lower abdomen.  History was antibiotics through urgent care a week ago for presumed pneumonia but did not improve.  Son reports they talked to the primary care doctor and were concerned about appendicitis or other ongoing process and instructed to come to the emergency department for further evaluation.  No vomiting or diarrhea.  No fever.  No urinary symptoms.  He denies any significant worsening with eating.  Never similar pain.  Patient is alert and appropriate.  No confusion.  Heart regular.  Lungs grossly clear.  Abdomen soft with mild to moderate right lateral lower quadrant.  No lower extremity edema.  Calf soft and nontender.  I agree with plan of management.   Arby Barrette, MD 08/29/20 [phone]

## 2020-08-29 NOTE — ED Provider Notes (Signed)
[facility] EMERGENCY DEPARTMENT Provider Note   CSN: [id number] Arrival date & time: 08/29/20  0841     History Chief Complaint  Patient presents with   Chest Pain    Juan [name] is a 69 y.o. male with a past medical history of hypertension, hyperlipidemia, type 2 diabetes presenting to the ED with a chief complaint of chest pain/abdominal pain.  History is provided by son at the bedside who acts as interpreter.  Offered patient medical interpreter but he declines.  States that for the past week he has had right upper quadrant abdominal pain.  He was seen and evaluated at urgent care 1 week ago, diagnosed with pneumonia and placed on antibiotics.  Patient himself states that pain has improved but son states that his father still acts like he is in pain.  Reports a slight dry cough but denies any worsening cough.  Denies any nausea, vomiting, diarrhea, urinary symptoms, shortness of breath, fever or sick contacts with similar symptoms. He has not tried any other medications to help with symptoms.  HPI     Past Medical History:  Diagnosis Date   Carotid artery occlusion    Diabetes mellitus without complication (HCC)    Type II, takes metformin per patient(11/01/15)   Hyperlipemia    Hypertension    Stroke (HCC) 06/2013   Left side- weak, unable to make a fist    Patient Active Problem List   Diagnosis Date Noted   Mobitz type 2 second degree heart block 02/07/2020   Eosinophilia 08/22/2019   Chronic kidney disease (CKD) 01/13/2019   Medication management 09/14/2017   Hypertension 07/27/2013   Dyslipidemia associated with type 2 diabetes mellitus (HCC) 07/27/2013   Carotid artery stenosis with cerebral infarction s/p left endarerectomy  07/20/2013   History of embolic stroke (01/6332) 07/16/2013   Type 2 diabetes mellitus with peripheral vascular disease (HCC) 07/16/2013    Past Surgical History:  Procedure Laterality Date   CAROTID  ENDARTERECTOMY Left 08-04-13   ENDARTERECTOMY Left 08/04/2013   Procedure: Left Carotid Endarterectomy with hemashield patch angioplasty;  Surgeon: Larina Earthly, MD;  Location: [facility] OR;  Service: Vascular;  Laterality: Left;   NECK SURGERY  2003   Cord Compression   PACEMAKER IMPLANT N/A 02/07/2020   Procedure: PACEMAKER IMPLANT;  Surgeon: Marinus Maw, MD;  Location: [facility] INVASIVE CV LAB;  Service: Cardiovascular;  Laterality: N/A;   TEE WITHOUT CARDIOVERSION N/A 07/20/2013   Procedure: TRANSESOPHAGEAL ECHOCARDIOGRAM (TEE);  Surgeon: Wendall Stade, MD;  Location: [facility] ENDOSCOPY;  Service: Cardiovascular;  Laterality: N/A;       History reviewed. No pertinent family history.  Social History   Tobacco Use   Smoking status: Former Smoker    Years: 50.00    Quit date: 12/28/2005    Years since quitting: 14.6   Smokeless tobacco: Former Neurosurgeon  Substance Use Topics   Alcohol use: No    Alcohol/week: 0.0 standard drinks    Comment: a few drinks occasionally    Drug use: No    Home Medications Prior to Admission medications   Medication Sig Start Date End Date Taking? Authorizing Provider  amoxicillin-clavulanate (AUGMENTIN) 875-125 MG tablet Take 1 tablet by mouth every 12 (twelve) hours. 08/22/20   Dahlia Byes A, NP  aspirin 81 MG EC tablet Take 1 tablet (81 mg total) by mouth daily. 04/30/20   Burns Spain, MD  atorvastatin (LIPITOR) 80 MG tablet Take 1 tablet (80 mg total) by mouth daily.  04/30/20   Burns Spain, MD  azithromycin (ZITHROMAX) 250 MG tablet Take 1 tablet (250 mg total) by mouth daily. Take first 2 tablets together, then 1 every day until finished. 08/22/20   Dahlia Byes A, NP  Blood Glucose Monitoring Suppl (FREESTYLE LITE) DEVI Use to check blood sugar up to 7 times a week 04/30/20   Burns Spain, MD  glucose blood (FREESTYLE LITE) test strip Check blood sugar up to 7 times a week as instructed 04/30/20   Burns Spain, MD  Lancets (FREESTYLE)  lancets Check blood sugar up to 7 times a week as instructed 04/30/20   Burns Spain, MD  lisinopril-hydrochlorothiazide (ZESTORETIC) 10-12.5 MG tablet Take 1 tablet by mouth daily. 04/30/20   Burns Spain, MD  sitaGLIPtin-metformin (JANUMET) 50-500 MG tablet Take 1 tablet by mouth 2 (two) times daily with a meal. 04/30/20   Burns Spain, MD    Allergies    Patient has no known allergies.  Review of Systems   Review of Systems  Constitutional: Negative for appetite change, chills and fever.  HENT: Negative for ear pain, rhinorrhea, sneezing and sore throat.   Eyes: Negative for photophobia and visual disturbance.  Respiratory: Negative for cough, chest tightness, shortness of breath and wheezing.   Cardiovascular: Negative for chest pain and palpitations.  Gastrointestinal: Positive for abdominal pain. Negative for blood in stool, constipation, diarrhea, nausea and vomiting.  Genitourinary: Negative for dysuria, hematuria and urgency.  Musculoskeletal: Negative for myalgias.  Skin: Negative for rash.  Neurological: Negative for dizziness, weakness and light-headedness.    Physical Exam Updated Vital Signs BP 129/77    Pulse 62    Temp 97.8 F (36.6 C) (Oral)    Resp 16    Ht 5\' 5"  (1.651 m)    Wt 68 kg    SpO2 99%    BMI 24.95 kg/m   Physical Exam Vitals and nursing note reviewed.  Constitutional:      General: He is not in acute distress.    Appearance: He is well-developed.  HENT:     Head: Normocephalic and atraumatic.     Nose: Nose normal.  Eyes:     General: No scleral icterus.       Left eye: No discharge.     Conjunctiva/sclera: Conjunctivae normal.  Cardiovascular:     Rate and Rhythm: Normal rate and regular rhythm.     Heart sounds: Normal heart sounds. No murmur heard.  No friction rub. No gallop.   Pulmonary:     Effort: Pulmonary effort is normal. No respiratory distress.     Breath sounds: Normal breath sounds.  Abdominal:     General:  Bowel sounds are normal. There is no distension.     Palpations: Abdomen is soft.     Tenderness: There is no abdominal tenderness. There is no guarding.  Musculoskeletal:        General: Normal range of motion.     Cervical back: Normal range of motion and neck supple.  Skin:    General: Skin is warm and dry.     Findings: No rash.  Neurological:     Mental Status: He is alert.     Motor: No abnormal muscle tone.     Coordination: Coordination normal.     ED Results / Procedures / Treatments   Labs (all labs ordered are listed, but only abnormal results are displayed) Labs Reviewed  BASIC METABOLIC PANEL - Abnormal; Notable for the  following components:      Result Value   Glucose, Bld 125 (*)    Creatinine, Ser 1.40 (*)    GFR calc non Af Amer 51 (*)    GFR calc Af Amer 59 (*)    All other components within normal limits  CBC - Abnormal; Notable for the following components:   Hemoglobin 10.9 (*)    HCT 35.4 (*)    MCH 25.2 (*)    Platelets 497 (*)    All other components within normal limits  HEPATIC FUNCTION PANEL - Abnormal; Notable for the following components:   Total Protein 8.4 (*)    Albumin 2.9 (*)    ALT 52 (*)    All other components within normal limits  LIPASE, BLOOD  TROPONIN I (HIGH SENSITIVITY)  TROPONIN I (HIGH SENSITIVITY)    EKG EKG Interpretation  Date/Time:  Thursday August 29 2020 09:09:35 EDT Ventricular Rate:  73 PR Interval:  164 QRS Duration: 152 QT Interval:  422 QTC Calculation: 464 R Axis:   -39 Text Interpretation: Atrial-sensed ventricular-paced rhythm with occasional Premature ventricular complexes Abnormal ECG agree. no sig change from previous Confirmed by Arby Barrette [phone]) on 08/29/2020 2:25:14 PM   Radiology DG Chest 2 View  Result Date: 08/29/2020 CLINICAL DATA:  Chest pain EXAM: CHEST - 2 VIEW COMPARISON:  08/21/2020 FINDINGS: Stable positioning of left-sided implanted cardiac device. Heart size is within normal  limits. Small right pleural effusion with patchy right basilar opacity, similar to the prior study. Minimal left basilar atelectasis. No pneumothorax. Partially visualized cervical ACDF hardware. IMPRESSION: Small right pleural effusion with patchy right basilar opacity, similar to the prior study. Electronically Signed   By: Duanne Guess D.O.   On: 08/29/2020 09:48   CT Angio Chest PE W/Cm &/Or Wo Cm  Result Date: 08/29/2020 CLINICAL DATA:  Chest pain. EXAM: CT ANGIOGRAPHY CHEST WITH CONTRAST TECHNIQUE: Multidetector CT imaging of the chest was performed using the standard protocol during bolus administration of intravenous contrast. Multiplanar CT image reconstructions and MIPs were obtained to evaluate the vascular anatomy. CONTRAST:  46mL OMNIPAQUE IOHEXOL 300 MG/ML  SOLN COMPARISON:  None. FINDINGS: Cardiovascular: There is mild calcification of the thoracic aorta. Satisfactory opacification of the pulmonary arteries to the segmental level. No evidence of pulmonary embolism. Normal heart size. No pericardial effusion. Moderate to marked severity coronary artery calcification is noted. Mediastinum/Nodes: No enlarged mediastinal, hilar, or axillary lymph nodes. The thyroid gland and trachea demonstrate no significant findings. Mild diffuse esophageal wall thickening is seen. Lungs/Pleura: Mild to moderate severity infiltrate is seen within the posterolateral aspect of the right middle lobe. Mild posterior right basilar atelectasis is noted. There is a small right pleural effusion. No pneumothorax is identified. Upper Abdomen: No acute abnormality. Musculoskeletal: No chest wall abnormality. No acute or significant osseous findings. Review of the MIP images confirms the above findings. IMPRESSION: 1. No evidence of pulmonary embolism. 2. Mild to moderate severity right middle lobe infiltrate. Follow-up to resolution is recommended, as an underlying neoplastic process cannot be excluded. 3. Small right  pleural effusion. 4. Mild diffuse esophageal wall thickening while this may be secondary to poor esophageal distension, sequelae associated with diffuse esophagitis cannot be excluded. 5. Moderate to marked severity coronary artery calcification. 6. Aortic atherosclerosis. Aortic Atherosclerosis (ICD10-I70.0). Electronically Signed   By: Aram Candela M.D.   On: 08/29/2020 20:53   CT ABDOMEN PELVIS W CONTRAST  Result Date: 08/29/2020 CLINICAL DATA:  Right-sided chest pain and right upper  quadrant abdominal pain. EXAM: CT ABDOMEN AND PELVIS WITH CONTRAST TECHNIQUE: Multidetector CT imaging of the abdomen and pelvis was performed using the standard protocol following bolus administration of intravenous contrast. CONTRAST:  OMNIPAQUE IOHEXOL 300 MG/ML  SOLN COMPARISON:  None. FINDINGS: Lower chest: Mild areas of atelectasis and/or infiltrate are seen within the posterior lateral aspect of the right middle lobe and posterior aspect of the right lower lobe. There is a small right pleural effusion. Hepatobiliary: No focal liver abnormality is seen. No gallstones, gallbladder wall thickening, or biliary dilatation. Pancreas: Unremarkable. No pancreatic ductal dilatation or surrounding inflammatory changes. Spleen: Normal in size without focal abnormality. Adrenals/Urinary Tract: Adrenal glands are unremarkable. Kidneys are normal in size, without renal calculi or hydronephrosis. A subcentimeter cyst is seen within the posterior aspect of the mid right kidney. Bladder is unremarkable. Stomach/Bowel: Stomach is within normal limits. Appendix appears normal. No evidence of bowel wall thickening, distention, or inflammatory changes. Vascular/Lymphatic: There is marked severity calcification and atherosclerosis of the abdominal aorta and bilateral common iliac arteries, without evidence of aneurysmal dilatation. No enlarged abdominal or pelvic lymph nodes. Reproductive: There is mild to moderate severity prostate  gland enlargement. Other: No abdominal wall hernia or abnormality. No abdominopelvic ascites. Musculoskeletal: Multilevel degenerative changes seen throughout the lumbar spine. IMPRESSION: 1. Mild right middle lobe and right lower lobe atelectasis and/or infiltrate. Further evaluation with chest CT is recommended. 2. Small right pleural effusion. 3. Marked severity calcification and atherosclerosis of the abdominal aorta and bilateral common iliac arteries, without evidence of aneurysmal dilatation. 4. Small right renal cyst. 5. Multilevel degenerative changes throughout the lumbar spine. 6. Enlarged prostate gland. Aortic Atherosclerosis (ICD10-I70.0). Electronically Signed   By: Aram Candela M.D.   On: 08/29/2020 18:22    Procedures Procedures (including critical care time)  Medications Ordered in ED Medications  iohexol (OMNIPAQUE) 300 MG/ML solution 100 mL (100 mLs Intravenous Contrast Given 08/29/20 1743)  sodium chloride 0.9 % bolus 1,000 mL (0 mLs Intravenous Stopped 08/29/20 2011)  iohexol (OMNIPAQUE) 300 MG/ML solution 50 mL (50 mLs Intravenous Contrast Given 08/29/20 2040)    ED Course  I have reviewed the triage vital signs and the nursing notes.  Pertinent labs & imaging results that were available during my care of the patient were reviewed by me and considered in my medical decision making (see chart for details).  Clinical Course as of Aug 30 2103  Thu Aug 29, 2020  1932 Spoke to CT tech regarding CT PE study.  They will give a reduced contrast load after IV hydration.   [HK]    Clinical Course User Index [HK] Dietrich Pates, PA-C    MDM Rules/Calculators/A&P                          33-year-old male with past medical history of hypertension, hyperlipidemia, type 2 diabetes presenting to the ED with a chief complaint of chest pain/abdominal pain.  Majority of history is provided by son at the bedside who acts as interpreter as he declined a medical interpreter.  For the past  week has been having right upper quadrant abdominal pain/lower chest pain.  Evaluated at urgent care 1 week ago and diagnosed with pneumonia, placed on antibiotics.  Patient himself states that pain has improved but son states that his father still acts like he is in pain.  Noted a slight dry cough but denies any productive cough or worsening cough.  No fevers, nausea,  vomiting, changes to bowel movements, urinary symptoms, shortness of breath.  On exam patient has some tenderness of the right upper quadrant.  He has no prior abdominal surgeries.  EKG here shows no significant changes from prior tracings.  Chest x-ray shows small pleural effusion with patchy opacity that is unchanged from last week.  He does not have a leukocytosis.  Other lab work including CBC and BMP unremarkable.  Creatinine is at his baseline.  Troponin is negative x1.  We will need to obtain LFTs, lipase and delta troponin for further work-up.  He is declining medication at this time.  Lab work including LFTs, lipase and delta troponin are unremarkable.  CT of abdomen and pelvis with contrast shows no acute intra-abdominal findings but does show this possible right-sided infiltrate versus atelectasis.  There is also a small right pleural effusion.  We will need to obtain CT PE study to r/o PE as these lung findings could be infarcts.  CT angio shows no acute findings other than redemonstrated right middle lobe pneumonia. I do not feel that he needs to be on more antibiotics at this time but will instead need time for this to resolve. Informed patient of these results and will have him follow-up with PCP for repeat imaging to ensure resolution. Patient is agreeable to the plan. He remains in no acute distress. Patient discussed with and seen by the attending, Dr. Donnald GarrePfeiffer.   Patient is hemodynamically stable, in NAD, and able to ambulate in the ED. Evaluation does not show pathology that would require ongoing emergent intervention or  inpatient treatment. I explained the diagnosis to the patient. Pain has been managed and has no complaints prior to discharge. Patient is comfortable with above plan and is stable for discharge at this time. All questions were answered prior to disposition. Strict return precautions for returning to the ED were discussed. Encouraged follow up with PCP.   An After Visit Summary was printed and given to the patient.   Portions of this note were generated with Scientist, clinical (histocompatibility and immunogenetics)Dragon dictation software. Dictation errors may occur despite best attempts at proofreading.  Final Clinical Impression(s) / ED Diagnoses Final diagnoses:  Community acquired pneumonia of right middle lobe of lung    Rx / DC Orders ED Discharge Orders    None       Dietrich PatesKhatri, Shemica Meath, PA-C 08/29/20 2105    Arby BarrettePfeiffer, Marcy, MD 09/01/20 0930

## 2020-08-29 NOTE — ED Notes (Signed)
Patient transported to CT 

## 2020-08-29 NOTE — ED Notes (Signed)
Pt taken back to CT.

## 2020-08-29 NOTE — ED Notes (Signed)
Discharge teaching done with patient , both family and pt verbalized understanding.

## 2020-08-29 NOTE — ED Triage Notes (Signed)
Pt reports right side chest pain that started several days ago. Increases with breathing and movement.

## 2020-08-29 NOTE — Discharge Instructions (Signed)
Your CT scan did not show any acute abnormalities other than the pneumonia. You will have to have repeat imaging done in 4 to 6 weeks by your primary care provider to make sure this has improved. Take Tylenol as needed to help with your symptoms. Return to the ER if you start to experience worsening abdominal pain, chest pain, shortness of breath, fever, leg swelling.

## 2020-09-12 ENCOUNTER — Ambulatory Visit (INDEPENDENT_AMBULATORY_CARE_PROVIDER_SITE_OTHER): Payer: PPO | Admitting: Internal Medicine

## 2020-09-12 ENCOUNTER — Other Ambulatory Visit: Payer: Self-pay

## 2020-09-12 ENCOUNTER — Encounter: Payer: Self-pay | Admitting: Internal Medicine

## 2020-09-12 VITALS — BP 183/68 | HR 64 | Temp 97.8°F | Ht 60.0 in | Wt 148.0 lb

## 2020-09-12 DIAGNOSIS — J189 Pneumonia, unspecified organism: Secondary | ICD-10-CM | POA: Insufficient documentation

## 2020-09-12 DIAGNOSIS — E785 Hyperlipidemia, unspecified: Secondary | ICD-10-CM | POA: Diagnosis not present

## 2020-09-12 DIAGNOSIS — E1151 Type 2 diabetes mellitus with diabetic peripheral angiopathy without gangrene: Secondary | ICD-10-CM

## 2020-09-12 DIAGNOSIS — I10 Essential (primary) hypertension: Secondary | ICD-10-CM

## 2020-09-12 DIAGNOSIS — I700 Atherosclerosis of aorta: Secondary | ICD-10-CM

## 2020-09-12 HISTORY — DX: Pneumonia, unspecified organism: J18.9

## 2020-09-12 LAB — POCT GLYCOSYLATED HEMOGLOBIN (HGB A1C): Hemoglobin A1C: 6.9 % — AB (ref 4.0–5.6)

## 2020-09-12 LAB — GLUCOSE, CAPILLARY: Glucose-Capillary: 117 mg/dL — ABNORMAL HIGH (ref 70–99)

## 2020-09-12 MED ORDER — JANUMET 50-500 MG PO TABS: 1.0000 | ORAL_TABLET | Freq: Two times a day (BID) | ORAL | 3 refills | Status: DC

## 2020-09-12 MED ORDER — ATORVASTATIN CALCIUM 80 MG PO TABS: 80.0000 mg | ORAL_TABLET | Freq: Every day | ORAL | 3 refills | Status: DC

## 2020-09-12 MED ORDER — LISINOPRIL-HYDROCHLOROTHIAZIDE 10-12.5 MG PO TABS: 1.0000 | ORAL_TABLET | Freq: Every day | ORAL | 3 refills | Status: DC

## 2020-09-12 MED ORDER — ASPIRIN 81 MG PO TBEC: 81.0000 mg | DELAYED_RELEASE_TABLET | Freq: Every day | ORAL | 3 refills | Status: DC

## 2020-09-12 NOTE — Assessment & Plan Note (Signed)
Type 2 diabetes mellitus: Well-controlled.  A1c today 6.9% from 8.1% when last checked.  Home blood glucose has been in the 150s.  Plan: -Continue Janumet 50-500 mg twice daily -In the future, consideration can be given for an SGLT2 inhibitor or GLP-1 agonist given the likelihood for coronary artery disease as CT angiography chest showed aortic atherosclerosis

## 2020-09-12 NOTE — Assessment & Plan Note (Signed)
Hypertension: Uncontrolled.  He states that he ran out of his antihypertensive yesterday and has since not been able to take his medication.  Per chart review, his blood pressures have been in the 110s-140s  BP Readings from Last 3 Encounters:  09/12/20 (!) 183/68  08/29/20 (!) 173/100  08/22/20 131/71   Plan: -Given refill of his antihypertensive today (lisinopril-HCTZ 10-12.5 mg daily) -Follow-up in clinic in 2 weeks for hypertension follow-up

## 2020-09-12 NOTE — Assessment & Plan Note (Signed)
Pneumonia: He presented to the emergency department on August 22, 2020 after initially going to the urgent care for evaluation of rib pain, fever and hypoxia.  Chest x-ray was concerning for pneumonia and he was started on azithromycin and Augmentin.  Subsequently presented emergency department on August 29, 2020 with chest pain, abdominal pain and dry cough.  Work-up including chest x-ray, CT angiography chest, CT abdomen and pelvis, troponin, lipase and LFTs were consistent with a right middle lobe pneumonia and was subsequently discharged to follow-up with PCP.  Today, he denies shortness of breath, chest pain, fevers, chills.  Plan: -Repeat chest x-ray in 2 weeks given findings of small right-sided pleural effusion and also to monitor resolution of pneumonia

## 2020-09-12 NOTE — Progress Notes (Signed)
   CC: Follow-up hypertension, type 2 diabetes mellitus  HPI:  JuanJuan [name] is a 69 y.o. with medical history significant for carotid artery stenosis, type 2 diabetes mellitus, hypertension, hyperlipidemia presenting to follow-up on chronic medical problems  Please see problem based charting for further details.  Past Medical History:  Diagnosis Date  . Carotid artery occlusion   . Diabetes mellitus without complication (HCC)    Type II, takes metformin per patient(11/01/15)  . Hyperlipemia   . Hypertension   . Stroke [facility], [place]) 06/2013   Left side- weak, unable to make a fist   Review of Systems:  As per HPI  Physical Exam:  Vitals:   09/12/20 1428  BP: (!) 183/68  Pulse: 64  Temp: 97.8 F (36.6 C)  TempSrc: Oral  SpO2: 99%  Weight: 148 lb (67.1 kg)  Height: 5' (1.524 m)   Physical Exam Vitals and nursing note reviewed.  Constitutional:      Appearance: He is normal weight.  Cardiovascular:     Rate and Rhythm: Normal rate.     Heart sounds: No murmur heard.   Pulmonary:     Breath sounds: Decreased air movement (RLL) present. Examination of the right-lower field reveals decreased breath sounds. Decreased breath sounds present. No wheezing, rhonchi or rales.     Comments: Dullness to percussion at RLL  Chest:     Chest wall: No tenderness.  Neurological:     Mental Status: He is alert.     Assessment & Plan:   See Encounters Tab for problem based charting.  Patient discussed with Dr. Mikey Bussing

## 2020-09-12 NOTE — Assessment & Plan Note (Signed)
Aortic atherosclerosis: Discovered on CT angiography on August 29, 2020.  He denies any chest pain, shortness of breath or anginal symptoms.  He is already on statin.  No signs of angina or coronary artery disease and will thus hold off on cardiology consult for now.

## 2020-09-12 NOTE — Patient Instructions (Signed)
Mr. Leonhardt,   It was a pleasure taking care of you today.  Your hemoglobin A1c has improved to 6.9%.  Please continue taking all your medications.  For your blood pressure, I would like for you to record your blood pressure twice a day and I have given you a refill for your medications.  I will see you in the clinic in 2 weeks to follow-up on your blood pressure and repeat a chest x-ray.  Take care! Dr. Dortha Schwalbe  Please call the internal medicine center clinic if you have any questions or concerns, we may be able to help and keep you from a long and expensive emergency room wait. Our clinic and after hours phone number is [phone], the best time to call is Monday through Friday 9 am to 4 pm but there is always someone available 24/7 if you have an emergency. If you need medication refills please notify your pharmacy one week in advance and they will send [place] a request.

## 2020-09-13 ENCOUNTER — Encounter: Payer: PPO | Admitting: Internal Medicine

## 2020-09-17 NOTE — Progress Notes (Signed)
Internal Medicine Clinic Attending  Case discussed with Dr. Agyei  At the time of the visit.  We reviewed the resident's history and exam and pertinent patient test results.  I agree with the assessment, diagnosis, and plan of care documented in the resident's note.  

## 2020-09-26 ENCOUNTER — Ambulatory Visit (INDEPENDENT_AMBULATORY_CARE_PROVIDER_SITE_OTHER): Payer: PPO | Admitting: Internal Medicine

## 2020-09-26 ENCOUNTER — Other Ambulatory Visit: Payer: Self-pay

## 2020-09-26 ENCOUNTER — Encounter: Payer: Self-pay | Admitting: Internal Medicine

## 2020-09-26 ENCOUNTER — Ambulatory Visit (HOSPITAL_COMMUNITY)
Admission: RE | Admit: 2020-09-26 | Discharge: 2020-09-26 | Disposition: A | Discharge status: Home / Self Care | Payer: PPO | Source: Ambulatory Visit | Attending: Internal Medicine | Admitting: Internal Medicine

## 2020-09-26 VITALS — BP 119/64 | HR 60 | Temp 97.7°F | Wt 147.0 lb

## 2020-09-26 DIAGNOSIS — R945 Abnormal results of liver function studies: Secondary | ICD-10-CM

## 2020-09-26 DIAGNOSIS — J189 Pneumonia, unspecified organism: Secondary | ICD-10-CM | POA: Diagnosis not present

## 2020-09-26 DIAGNOSIS — I10 Essential (primary) hypertension: Secondary | ICD-10-CM | POA: Diagnosis not present

## 2020-09-26 DIAGNOSIS — R7989 Other specified abnormal findings of blood chemistry: Secondary | ICD-10-CM | POA: Insufficient documentation

## 2020-09-26 DIAGNOSIS — J90 Pleural effusion, not elsewhere classified: Secondary | ICD-10-CM | POA: Diagnosis not present

## 2020-09-26 NOTE — Assessment & Plan Note (Signed)
Right middle lobe pneumonia: Diagnosed on August 22, 2020.  He has completed antibiotic course with azithromycin and Augmentin.  He is doing well and denies any symptoms.  Plan: -Follow-up chest x-ray for surveillance

## 2020-09-26 NOTE — Patient Instructions (Addendum)
Juan [name],   Thanks for seeing me in the clinic today.  Your blood pressure is doing very well and I would encourage you to continue taking your medicine.  Because of your pneumonia, I will repeat a chest x-ray and also get blood work today.  Take care! Dr. Dortha Schwalbe  Please call the internal medicine center clinic if you have any questions or concerns, we may be able to help and keep you from a long and expensive emergency room wait. Our clinic and after hours phone number is [phone], the best time to call is Monday through Friday 9 am to 4 pm but there is always someone available 24/7 if you have an emergency. If you need medication refills please notify your pharmacy one week in advance and they will send [place] a request.

## 2020-09-26 NOTE — Assessment & Plan Note (Signed)
Mild abnormal liver function test: During one of his emergency department visit on August 29, 2020, he was found to have an elevated ALT of 52.  This is likely in the setting of infection.  Plan: -Follow-up CMP

## 2020-09-26 NOTE — Progress Notes (Signed)
   CC: Follow-up hypertension  HPI:  Mr.Donyell Skowron is a 69 y.o. with medical history listed below presented to follow-up on chronic medical problems.  Please see problem based charting for further details.           Past Medical History:  Diagnosis Date  . Carotid artery occlusion   . Diabetes mellitus without complication (HCC)    Type II, takes metformin per patient(11/01/15)  . Hyperlipemia   . Hypertension   . 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  Constitutional:      Appearance: He is normal weight.  Cardiovascular:     Rate and Rhythm: Normal rate.     Heart sounds: No murmur heard.   Pulmonary:     Breath sounds: No rales.  Neurological:     Mental Status: He is alert.  Psychiatric:        Mood and Affect: Mood normal.        Behavior: Behavior normal.     Assessment & Plan:   See Encounters Tab for problem based charting.  Patient discussed with Dr. Mayford Knife

## 2020-09-26 NOTE — Assessment & Plan Note (Signed)
Hypertension: Well-controlled.  During my encounter with him last week, he was found to have an elevated BP in the setting of running out of his antihypertensives.  BP Readings from Last 3 Encounters:  09/26/20 119/64  09/12/20 (!) 183/68  08/29/20 (!) 173/100   Plan: -Continue lisinopril-HCTZ 10-2.5 mg daily.

## 2020-09-27 LAB — CMP14 + ANION GAP
ALT: 27 IU/L (ref 0–44)
AST: 23 IU/L (ref 0–40)
Albumin/Globulin Ratio: 1.1 — ABNORMAL LOW (ref 1.2–2.2)
Albumin: 4 g/dL (ref 3.8–4.8)
Alkaline Phosphatase: 87 IU/L (ref 44–121)
Anion Gap: 17 mmol/L (ref 10.0–18.0)
BUN/Creatinine Ratio: 11 (ref 10–24)
BUN: 16 mg/dL (ref 8–27)
Bilirubin Total: 0.4 mg/dL (ref 0.0–1.2)
CO2: 24 mmol/L (ref 20–29)
Calcium: 9.8 mg/dL (ref 8.6–10.2)
Chloride: 100 mmol/L (ref 96–106)
Creatinine, Ser: 1.5 mg/dL — ABNORMAL HIGH (ref 0.76–1.27)
GFR calc Af Amer: 54 mL/min/{1.73_m2} — ABNORMAL LOW (ref >59)
GFR calc non Af Amer: 46 mL/min/{1.73_m2} — ABNORMAL LOW (ref >59)
Globulin, Total: 3.6 g/dL (ref 1.5–4.5)
Glucose: 91 mg/dL (ref 65–99)
Potassium: 4.7 mmol/L (ref 3.5–5.2)
Sodium: 141 mmol/L (ref 134–144)
Total Protein: 7.6 g/dL (ref 6.0–8.5)

## 2020-09-27 NOTE — Progress Notes (Signed)
Internal Medicine Clinic Attending  Case discussed with Dr. Agyei  At the time of the visit.  We reviewed the resident's history and exam and pertinent patient test results.  I agree with the assessment, diagnosis, and plan of care documented in the resident's note.  

## 2020-11-08 ENCOUNTER — Ambulatory Visit (INDEPENDENT_AMBULATORY_CARE_PROVIDER_SITE_OTHER): Payer: PPO

## 2020-11-08 DIAGNOSIS — I441 Atrioventricular block, second degree: Secondary | ICD-10-CM | POA: Diagnosis not present

## 2020-11-08 LAB — CUP PACEART REMOTE DEVICE CHECK
Battery Remaining Longevity: 127 mo
Battery Voltage: 3.07 V
Brady Statistic AP VP Percent: 4.48 %
Brady Statistic AP VS Percent: 6.74 %
Brady Statistic AS VP Percent: 55.78 %
Brady Statistic AS VS Percent: 33 %
Brady Statistic RA Percent Paced: 12.27 %
Brady Statistic RV Percent Paced: 60.26 %
Date Time Interrogation Session: 20211111232747
Implantable Lead Implant Date: 20210210
Implantable Lead Implant Date: 20210210
Implantable Lead Location: 753859
Implantable Lead Location: 753860
Implantable Lead Model: 3830
Implantable Lead Model: 5076
Implantable Pulse Generator Implant Date: 20210210
Lead Channel Impedance Value: 304 Ohm
Lead Channel Impedance Value: 361 Ohm
Lead Channel Impedance Value: 494 Ohm
Lead Channel Impedance Value: 551 Ohm
Lead Channel Pacing Threshold Amplitude: 0.625 V
Lead Channel Pacing Threshold Amplitude: 0.75 V
Lead Channel Pacing Threshold Pulse Width: 0.4 ms
Lead Channel Pacing Threshold Pulse Width: 0.4 ms
Lead Channel Sensing Intrinsic Amplitude: 30.625 mV
Lead Channel Sensing Intrinsic Amplitude: 30.625 mV
Lead Channel Sensing Intrinsic Amplitude: 5.625 mV
Lead Channel Sensing Intrinsic Amplitude: 5.625 mV
Lead Channel Setting Pacing Amplitude: 1.5 V
Lead Channel Setting Pacing Amplitude: 2.5 V
Lead Channel Setting Pacing Pulse Width: 0.4 ms
Lead Channel Setting Sensing Sensitivity: 2.8 mV

## 2020-11-11 NOTE — Progress Notes (Signed)
Remote pacemaker transmission.   

## 2021-01-05 IMAGING — DX DG CHEST 2V
2 series · 2 of 2 positions shown · non-contrast
Comparison: August 29, 2020

CLINICAL DATA: Recent pneumonia

EXAM:
CHEST - 2 VIEW

[chest pa]
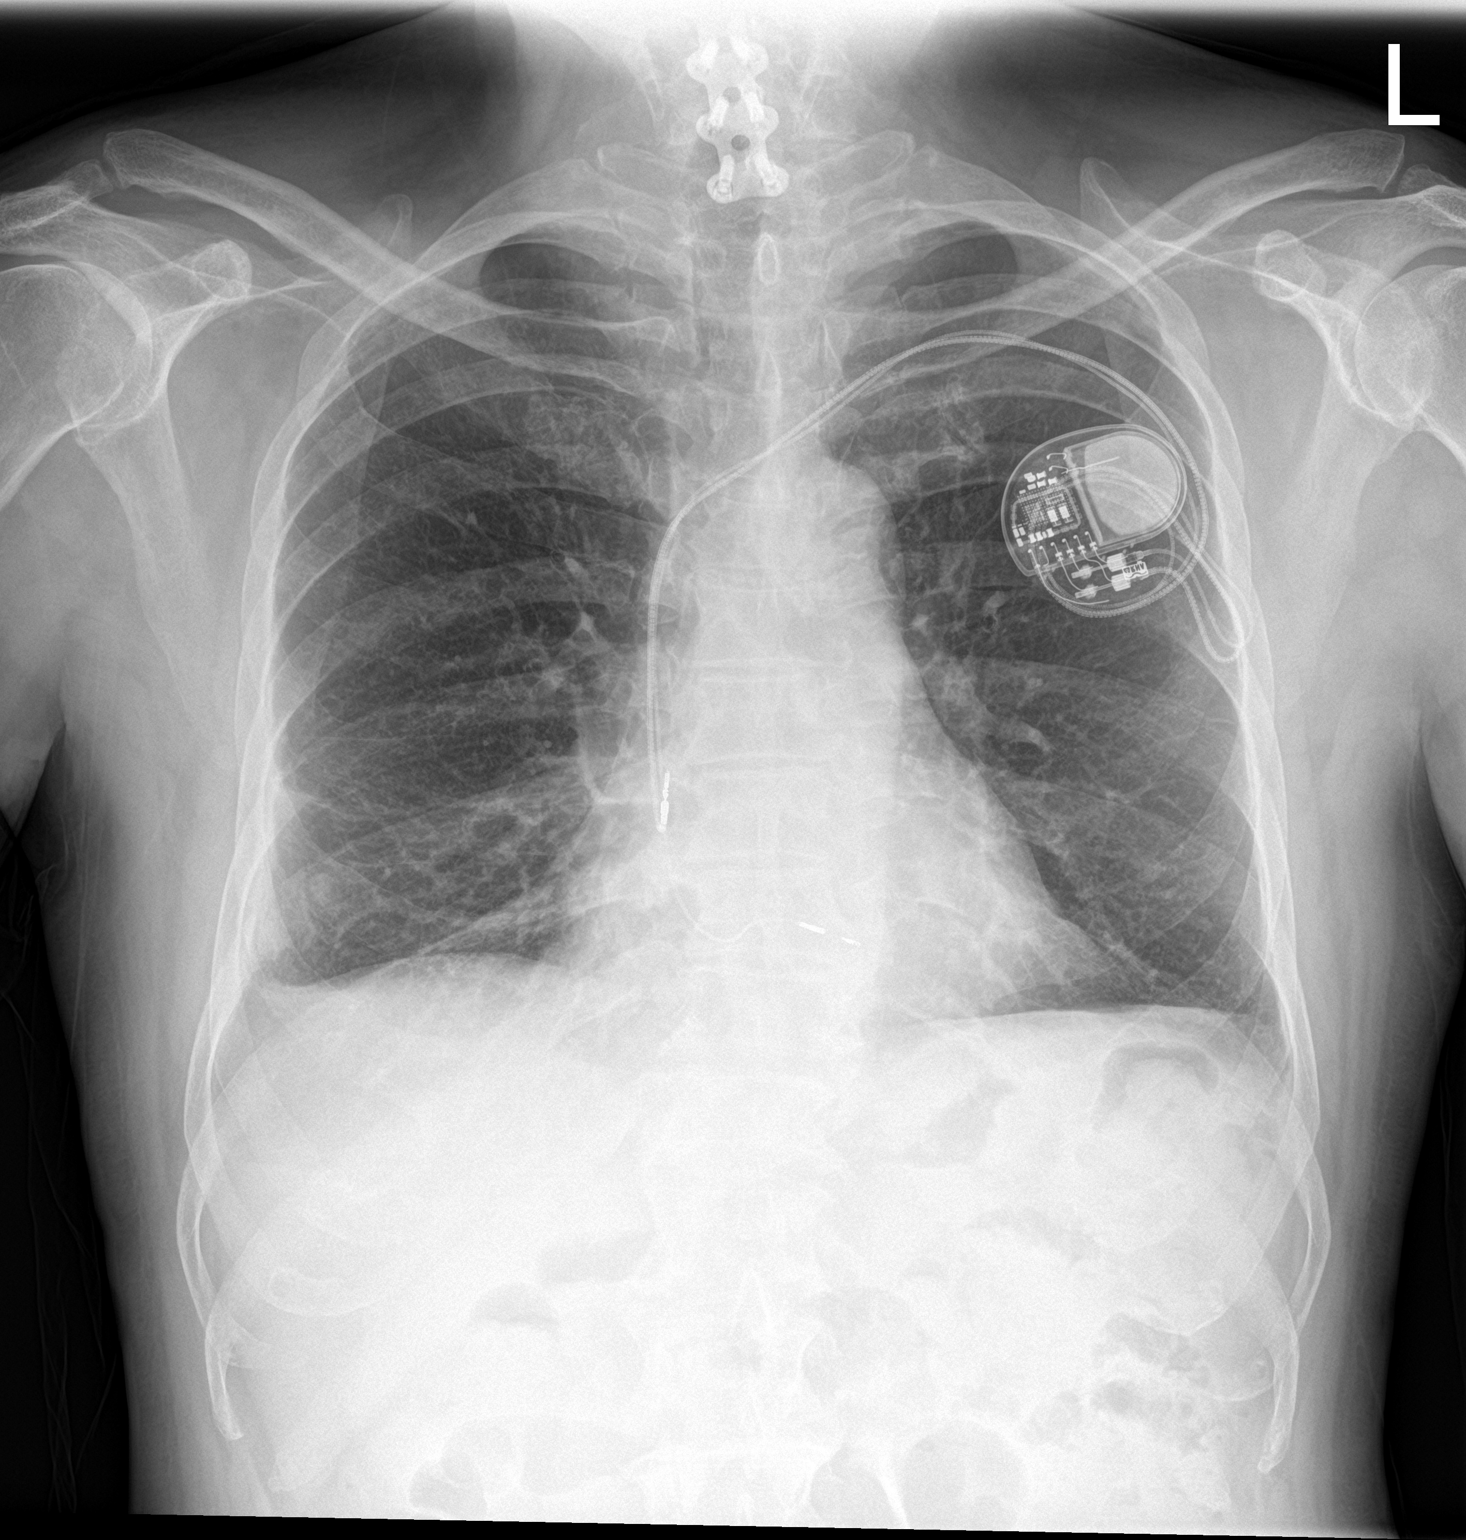

[chest lat]
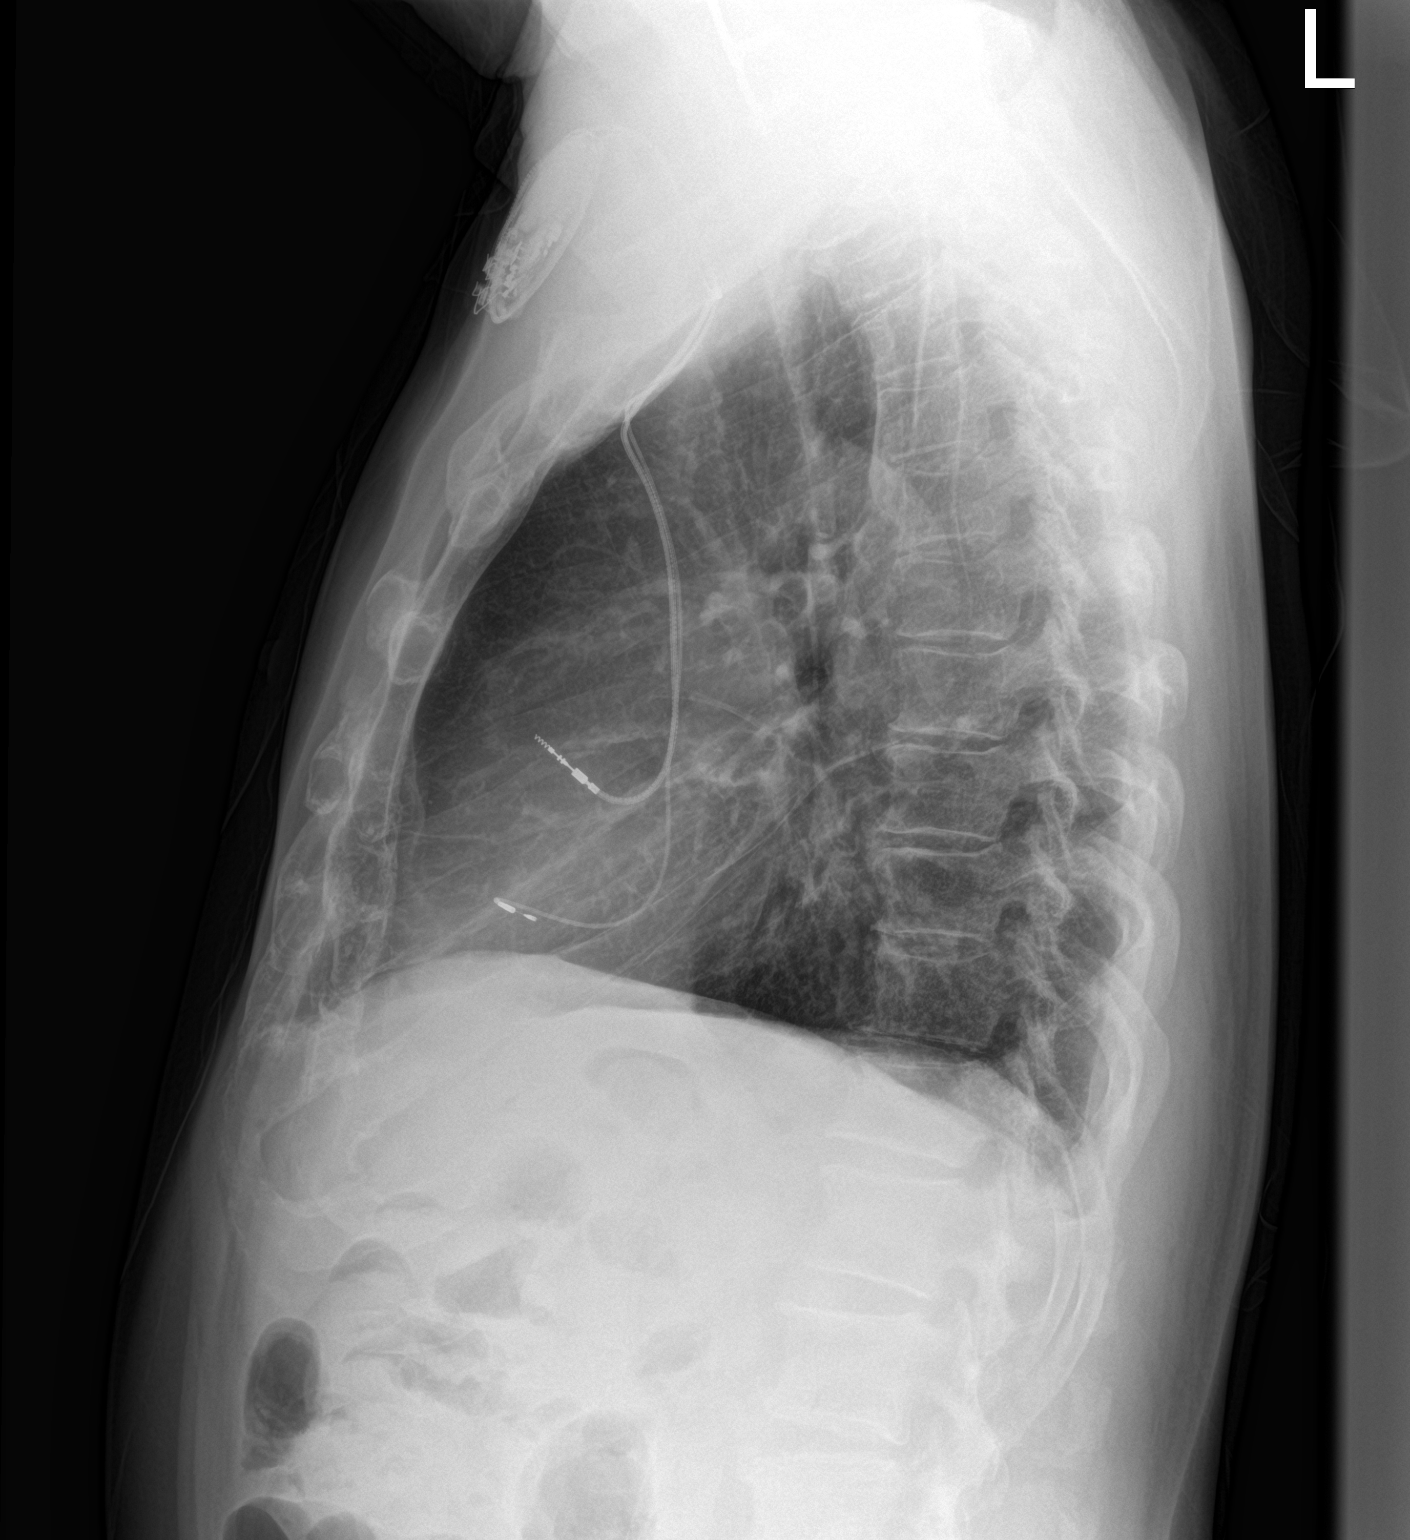

[2 of 2 positions shown; findings below may reference images not displayed]

FINDINGS: There has been significant partial clearing of infiltrate from the
right middle lobe. There is mild residual patchy opacity in this
area. Lungs elsewhere are clear. There is a small right pleural
effusion, significantly smaller than on previous study.

Heart size and pulmonary vascularity are normal. Pacemaker leads are
attached to the right atrium and right ventricle. No adenopathy.
There is postoperative change in the lower cervical spine. Small
cervical ribs noted bilaterally.
IMPRESSION: Significantly less airspace opacity in the right middle lobe with
small right pleural effusion compared to prior study. No new
opacity. Heart size normal. Pacemaker leads attached to right atrium
and right ventricle.

## 2021-02-07 ENCOUNTER — Ambulatory Visit (INDEPENDENT_AMBULATORY_CARE_PROVIDER_SITE_OTHER): Payer: PPO

## 2021-02-07 DIAGNOSIS — I441 Atrioventricular block, second degree: Secondary | ICD-10-CM

## 2021-02-09 LAB — CUP PACEART REMOTE DEVICE CHECK
Battery Remaining Longevity: 125 mo
Battery Voltage: 3.03 V
Brady Statistic AP VP Percent: 5.14 %
Brady Statistic AP VS Percent: 1.21 %
Brady Statistic AS VP Percent: 79.76 %
Brady Statistic AS VS Percent: 13.9 %
Brady Statistic RA Percent Paced: 7.66 %
Brady Statistic RV Percent Paced: 84.9 %
Date Time Interrogation Session: 20220210204823
Implantable Lead Implant Date: 20210210
Implantable Lead Implant Date: 20210210
Implantable Lead Location: 753859
Implantable Lead Location: 753860
Implantable Lead Model: 3830
Implantable Lead Model: 5076
Implantable Pulse Generator Implant Date: 20210210
Lead Channel Impedance Value: 304 Ohm
Lead Channel Impedance Value: 380 Ohm
Lead Channel Impedance Value: 456 Ohm
Lead Channel Impedance Value: 570 Ohm
Lead Channel Pacing Threshold Amplitude: 0.625 V
Lead Channel Pacing Threshold Amplitude: 0.625 V
Lead Channel Pacing Threshold Pulse Width: 0.4 ms
Lead Channel Pacing Threshold Pulse Width: 0.4 ms
Lead Channel Sensing Intrinsic Amplitude: 31.625 mV
Lead Channel Sensing Intrinsic Amplitude: 31.625 mV
Lead Channel Sensing Intrinsic Amplitude: 5.875 mV
Lead Channel Sensing Intrinsic Amplitude: 5.875 mV
Lead Channel Setting Pacing Amplitude: 1.5 V
Lead Channel Setting Pacing Amplitude: 2.5 V
Lead Channel Setting Pacing Pulse Width: 0.4 ms
Lead Channel Setting Sensing Sensitivity: 2.8 mV

## 2021-02-12 NOTE — Progress Notes (Signed)
Remote pacemaker transmission.   

## 2021-04-03 ENCOUNTER — Encounter: Payer: Self-pay | Admitting: *Deleted

## 2021-04-03 NOTE — Progress Notes (Unsigned)

## 2021-04-04 ENCOUNTER — Encounter: Payer: Self-pay | Admitting: Internal Medicine

## 2021-04-04 NOTE — Progress Notes (Signed)
Things That May Be Affecting Your Health:  Alcohol  Hearing loss  Pain    Depression  Home Safety  Sexual Health  X Diabetes  Lack of physical activity  Stress   Difficulty with daily activities  Loneliness  Tiredness   Drug use X Medicines X Tobacco use   Falls  Motor Vehicle Safety  Weight   Food choices  Oral Health  Other    YOUR PERSONALIZED HEALTH PLAN : 1. Schedule your next subsequent Medicare Wellness visit in one year 2. Attend all of your regular appointments to address your medical issues 3. Complete the preventative screenings and services   Annual Wellness Visit   Medicare Covered Preventative Screenings and Services  Services & Screenings Men and Women Who How Often Need? Date of Last Service Action  Abdominal Aortic Aneurysm Adults with AAA risk factors Once Y     Alcohol Misuse and Counseling All Adults Screening once a year if no alcohol misuse. Counseling up to 4 face to face sessions.     Bone Density Measurement  Adults at risk for osteoporosis Once every 2 yrs      Lipid Panel Z13.6 All adults without CV disease Once every 5 yrs       Colorectal Cancer   Stool sample or  Colonoscopy All adults 50 and older   Once every year  Every 10 years        Depression All Adults Once a year  Today   Diabetes Screening Blood glucose, post glucose load, or GTT Z13.1  All adults at risk  Pre-diabetics  Once per year  Twice per year      Diabetes  Self-Management Training All adults Diabetics 10 hrs first year; 2 hours subsequent years. Requires Copay Y    Glaucoma  Diabetics  Family history of glaucoma  African Americans 50 yrs +  Hispanic Americans 65 yrs + Annually - requires coppay      Hepatitis C Z72.89 or F19.20  High Risk for HCV  Born between 1945 and 1965  Annually  Once      HIV Z11.4 All adults based on risk  Annually btw ages 74 & 8 regardless of risk  Annually > 65 yrs if at increased risk      Lung Cancer Screening  Asymptomatic adults aged 28-77 with 30 pack yr history and current smoker OR quit within the last 15 yrs Annually Must have counseling and shared decision making documentation before first screen      Medical Nutrition Therapy Adults with   Diabetes  Renal disease  Kidney transplant within past 3 yrs 3 hours first year; 2 hours subsequent years     Obesity and Counseling All adults Screening once a year Counseling if BMI 30 or higher  Today   Tobacco Use Counseling Adults who use tobacco  Up to 8 visits in one year Y    Vaccines Z23  Hepatitis B  Influenza   Pneumonia  Adults   Once  Once every flu season  Two different vaccines separated by one year     Next Annual Wellness Visit People with Medicare Every year  Today     Services & Screenings Women Who How Often Need  Date of Last Service Action  Mammogram  Z12.31 Women over 40 One baseline ages 66-39. Annually ager 40 yrs+      Pap tests All women Annually if high risk. Every 2 yrs for normal risk women  Screening for cervical cancer with   Pap (Z01.419 nl or Z01.411abnl) &  HPV Z11.51 Women aged 95 to 62 Once every 5 yrs     Screening pelvic and breast exams All women Annually if high risk. Every 2 yrs for normal risk women     Sexually Transmitted Diseases  Chlamydia  Gonorrhea  Syphilis All at risk adults Annually for non pregnant females at increased risk         Services & Screenings Men Who How Ofter Need  Date of Last Service Action  Prostate Cancer - DRE & PSA Men over 50 Annually.  DRE might require a copay.        Sexually Transmitted Diseases  Syphilis All at risk adults Annually for men at increased risk      Health Maintenance List Health Maintenance  Topic Date Due  . COVID-19 Vaccine (1) Never done  . OPHTHALMOLOGY EXAM  10/30/2017  . HEMOGLOBIN A1C  12/12/2020  . LIPID PANEL  01/28/2022 (Originally 01/07/2020)  . INFLUENZA VACCINE  07/28/2021  . FOOT EXAM  09/12/2021  .  COLONOSCOPY (Pts 45-27yrs Insurance coverage will need to be confirmed)  08/21/2026  . TETANUS/TDAP  08/10/2027  . Hepatitis C Screening  Completed  . PNA vac Low Risk Adult  Completed  . HPV VACCINES  Aged Out  . COLON CANCER SCREENING ANNUAL FOBT  Discontinued

## 2021-05-09 ENCOUNTER — Ambulatory Visit (INDEPENDENT_AMBULATORY_CARE_PROVIDER_SITE_OTHER): Payer: PPO

## 2021-05-09 DIAGNOSIS — I441 Atrioventricular block, second degree: Secondary | ICD-10-CM | POA: Diagnosis not present

## 2021-05-09 LAB — CUP PACEART REMOTE DEVICE CHECK
Battery Remaining Longevity: 121 mo
Battery Voltage: 3.01 V
Brady Statistic AP VP Percent: 2.47 %
Brady Statistic AP VS Percent: 0.25 %
Brady Statistic AS VP Percent: 94.57 %
Brady Statistic AS VS Percent: 2.71 %
Brady Statistic RA Percent Paced: 3.42 %
Brady Statistic RV Percent Paced: 97.04 %
Date Time Interrogation Session: 20220513025523
Implantable Lead Implant Date: 20210210
Implantable Lead Implant Date: 20210210
Implantable Lead Location: 753859
Implantable Lead Location: 753860
Implantable Lead Model: 3830
Implantable Lead Model: 5076
Implantable Pulse Generator Implant Date: 20210210
Lead Channel Impedance Value: 323 Ohm
Lead Channel Impedance Value: 380 Ohm
Lead Channel Impedance Value: 475 Ohm
Lead Channel Impedance Value: 570 Ohm
Lead Channel Pacing Threshold Amplitude: 0.625 V
Lead Channel Pacing Threshold Amplitude: 0.75 V
Lead Channel Pacing Threshold Pulse Width: 0.4 ms
Lead Channel Pacing Threshold Pulse Width: 0.4 ms
Lead Channel Sensing Intrinsic Amplitude: 31.625 mV
Lead Channel Sensing Intrinsic Amplitude: 31.625 mV
Lead Channel Sensing Intrinsic Amplitude: 6 mV
Lead Channel Sensing Intrinsic Amplitude: 6 mV
Lead Channel Setting Pacing Amplitude: 1.5 V
Lead Channel Setting Pacing Amplitude: 2.5 V
Lead Channel Setting Pacing Pulse Width: 0.4 ms
Lead Channel Setting Sensing Sensitivity: 2.8 mV

## 2021-05-22 ENCOUNTER — Encounter: Payer: Self-pay | Admitting: *Deleted

## 2021-05-22 NOTE — Progress Notes (Signed)
Remote pacemaker transmission.   

## 2021-07-01 ENCOUNTER — Encounter: Payer: Self-pay | Admitting: *Deleted

## 2021-08-08 ENCOUNTER — Ambulatory Visit (INDEPENDENT_AMBULATORY_CARE_PROVIDER_SITE_OTHER): Payer: PPO

## 2021-08-08 DIAGNOSIS — I441 Atrioventricular block, second degree: Secondary | ICD-10-CM | POA: Diagnosis not present

## 2021-08-08 LAB — CUP PACEART REMOTE DEVICE CHECK
Battery Remaining Longevity: 117 mo
Battery Voltage: 3.01 V
Brady Statistic AP VP Percent: 9.4 %
Brady Statistic AP VS Percent: 0.04 %
Brady Statistic AS VP Percent: 87.07 %
Brady Statistic AS VS Percent: 3.49 %
Brady Statistic RA Percent Paced: 10.68 %
Brady Statistic RV Percent Paced: 96.47 %
Date Time Interrogation Session: 20220812023119
Implantable Lead Implant Date: 20210210
Implantable Lead Implant Date: 20210210
Implantable Lead Location: 753859
Implantable Lead Location: 753860
Implantable Lead Model: 3830
Implantable Lead Model: 5076
Implantable Pulse Generator Implant Date: 20210210
Lead Channel Impedance Value: 323 Ohm
Lead Channel Impedance Value: 380 Ohm
Lead Channel Impedance Value: 494 Ohm
Lead Channel Impedance Value: 570 Ohm
Lead Channel Pacing Threshold Amplitude: 0.625 V
Lead Channel Pacing Threshold Amplitude: 0.625 V
Lead Channel Pacing Threshold Pulse Width: 0.4 ms
Lead Channel Pacing Threshold Pulse Width: 0.4 ms
Lead Channel Sensing Intrinsic Amplitude: 31.625 mV
Lead Channel Sensing Intrinsic Amplitude: 31.625 mV
Lead Channel Sensing Intrinsic Amplitude: 6.625 mV
Lead Channel Sensing Intrinsic Amplitude: 6.625 mV
Lead Channel Setting Pacing Amplitude: 1.5 V
Lead Channel Setting Pacing Amplitude: 2.5 V
Lead Channel Setting Pacing Pulse Width: 0.4 ms
Lead Channel Setting Sensing Sensitivity: 2.8 mV

## 2021-08-28 NOTE — Progress Notes (Signed)
Remote pacemaker transmission.   

## 2021-09-03 ENCOUNTER — Encounter: Payer: PPO | Admitting: Internal Medicine

## 2021-11-07 ENCOUNTER — Ambulatory Visit (INDEPENDENT_AMBULATORY_CARE_PROVIDER_SITE_OTHER): Payer: PPO

## 2021-11-07 DIAGNOSIS — I441 Atrioventricular block, second degree: Secondary | ICD-10-CM

## 2021-11-07 LAB — CUP PACEART REMOTE DEVICE CHECK
Battery Remaining Longevity: 115 mo
Battery Voltage: 3.01 V
Brady Statistic AP VP Percent: 5.19 %
Brady Statistic AP VS Percent: 0.02 %
Brady Statistic AS VP Percent: 92.25 %
Brady Statistic AS VS Percent: 2.54 %
Brady Statistic RA Percent Paced: 5.97 %
Brady Statistic RV Percent Paced: 97.44 %
Date Time Interrogation Session: 20221111012246
Implantable Lead Implant Date: 20210210
Implantable Lead Implant Date: 20210210
Implantable Lead Location: 753859
Implantable Lead Location: 753860
Implantable Lead Model: 3830
Implantable Lead Model: 5076
Implantable Pulse Generator Implant Date: 20210210
Lead Channel Impedance Value: 323 Ohm
Lead Channel Impedance Value: 399 Ohm
Lead Channel Impedance Value: 475 Ohm
Lead Channel Impedance Value: 589 Ohm
Lead Channel Pacing Threshold Amplitude: 0.625 V
Lead Channel Pacing Threshold Amplitude: 0.75 V
Lead Channel Pacing Threshold Pulse Width: 0.4 ms
Lead Channel Pacing Threshold Pulse Width: 0.4 ms
Lead Channel Sensing Intrinsic Amplitude: 31.625 mV
Lead Channel Sensing Intrinsic Amplitude: 31.625 mV
Lead Channel Sensing Intrinsic Amplitude: 6.375 mV
Lead Channel Sensing Intrinsic Amplitude: 6.375 mV
Lead Channel Setting Pacing Amplitude: 1.5 V
Lead Channel Setting Pacing Amplitude: 2.5 V
Lead Channel Setting Pacing Pulse Width: 0.4 ms
Lead Channel Setting Sensing Sensitivity: 2.8 mV

## 2021-11-17 NOTE — Progress Notes (Signed)
Remote pacemaker transmission.   

## 2022-01-14 ENCOUNTER — Telehealth: Payer: Self-pay | Admitting: *Deleted

## 2022-01-14 ENCOUNTER — Emergency Department (HOSPITAL_COMMUNITY)
Admission: EM | Admit: 2022-01-14 | Discharge: 2022-01-14 | Disposition: A | Discharge status: Home / Self Care | Payer: PPO | Attending: Emergency Medicine | Admitting: Emergency Medicine

## 2022-01-14 ENCOUNTER — Emergency Department (HOSPITAL_COMMUNITY): Payer: PPO

## 2022-01-14 ENCOUNTER — Other Ambulatory Visit: Payer: Self-pay

## 2022-01-14 DIAGNOSIS — E785 Hyperlipidemia, unspecified: Secondary | ICD-10-CM | POA: Diagnosis not present

## 2022-01-14 DIAGNOSIS — R29810 Facial weakness: Secondary | ICD-10-CM | POA: Diagnosis present

## 2022-01-14 DIAGNOSIS — Z7984 Long term (current) use of oral hypoglycemic drugs: Secondary | ICD-10-CM | POA: Diagnosis not present

## 2022-01-14 DIAGNOSIS — R519 Headache, unspecified: Secondary | ICD-10-CM | POA: Diagnosis not present

## 2022-01-14 DIAGNOSIS — I10 Essential (primary) hypertension: Secondary | ICD-10-CM | POA: Diagnosis not present

## 2022-01-14 DIAGNOSIS — E119 Type 2 diabetes mellitus without complications: Secondary | ICD-10-CM | POA: Diagnosis not present

## 2022-01-14 DIAGNOSIS — H02401 Unspecified ptosis of right eyelid: Secondary | ICD-10-CM | POA: Diagnosis not present

## 2022-01-14 DIAGNOSIS — Z79899 Other long term (current) drug therapy: Secondary | ICD-10-CM | POA: Diagnosis not present

## 2022-01-14 DIAGNOSIS — G510 Bell's palsy: Secondary | ICD-10-CM | POA: Insufficient documentation

## 2022-01-14 DIAGNOSIS — Z7982 Long term (current) use of aspirin: Secondary | ICD-10-CM | POA: Insufficient documentation

## 2022-01-14 LAB — COMPREHENSIVE METABOLIC PANEL
ALT: 23 U/L (ref 0–44)
AST: 21 U/L (ref 15–41)
Albumin: 3.1 g/dL — ABNORMAL LOW (ref 3.5–5.0)
Alkaline Phosphatase: 79 U/L (ref 38–126)
Anion gap: 10 (ref 5–15)
BUN: 13 mg/dL (ref 8–23)
CO2: 27 mmol/L (ref 22–32)
Calcium: 9.6 mg/dL (ref 8.9–10.3)
Chloride: 96 mmol/L — ABNORMAL LOW (ref 98–111)
Creatinine, Ser: 1.67 mg/dL — ABNORMAL HIGH (ref 0.61–1.24)
GFR, Estimated: 43 mL/min — ABNORMAL LOW (ref >60)
Glucose, Bld: 261 mg/dL — ABNORMAL HIGH (ref 70–99)
Potassium: 4.4 mmol/L (ref 3.5–5.1)
Sodium: 133 mmol/L — ABNORMAL LOW (ref 135–145)
Total Bilirubin: 0.5 mg/dL (ref 0.3–1.2)
Total Protein: 7.2 g/dL (ref 6.5–8.1)

## 2022-01-14 LAB — I-STAT CHEM 8, ED
BUN: 15 mg/dL (ref 8–23)
Calcium, Ion: 1.18 mmol/L (ref 1.15–1.40)
Chloride: 98 mmol/L (ref 98–111)
Creatinine, Ser: 1.6 mg/dL — ABNORMAL HIGH (ref 0.61–1.24)
Glucose, Bld: 266 mg/dL — ABNORMAL HIGH (ref 70–99)
HCT: 48 % (ref 39.0–52.0)
Hemoglobin: 16.3 g/dL (ref 13.0–17.0)
Potassium: 4.7 mmol/L (ref 3.5–5.1)
Sodium: 134 mmol/L — ABNORMAL LOW (ref 135–145)
TCO2: 30 mmol/L (ref 22–32)

## 2022-01-14 LAB — CBC
HCT: 46.1 % (ref 39.0–52.0)
Hemoglobin: 14.5 g/dL (ref 13.0–17.0)
MCH: 25.8 pg — ABNORMAL LOW (ref 26.0–34.0)
MCHC: 31.5 g/dL (ref 30.0–36.0)
MCV: 82 fL (ref 80.0–100.0)
Platelets: 246 10*3/uL (ref 150–400)
RBC: 5.62 MIL/uL (ref 4.22–5.81)
RDW: 14.2 % (ref 11.5–15.5)
WBC: 7.9 10*3/uL (ref 4.0–10.5)
nRBC: 0 % (ref 0.0–0.2)

## 2022-01-14 LAB — PROTIME-INR
INR: 0.9 (ref 0.8–1.2)
Prothrombin Time: 12.3 seconds (ref 11.4–15.2)

## 2022-01-14 LAB — DIFFERENTIAL
Abs Immature Granulocytes: 0.02 10*3/uL (ref 0.00–0.07)
Basophils Absolute: 0.1 10*3/uL (ref 0.0–0.1)
Basophils Relative: 1 %
Eosinophils Absolute: 0.5 10*3/uL (ref 0.0–0.5)
Eosinophils Relative: 6 %
Immature Granulocytes: 0 %
Lymphocytes Relative: 37 %
Lymphs Abs: 3 10*3/uL (ref 0.7–4.0)
Monocytes Absolute: 0.5 10*3/uL (ref 0.1–1.0)
Monocytes Relative: 6 %
Neutro Abs: 3.9 10*3/uL (ref 1.7–7.7)
Neutrophils Relative %: 50 %

## 2022-01-14 LAB — ETHANOL: Alcohol, Ethyl (B): <10 mg/dL (ref <10)

## 2022-01-14 LAB — APTT: aPTT: 29 seconds (ref 24–36)

## 2022-01-14 MED ORDER — POLYVINYL ALCOHOL 1.4 % OP SOLN: 2.0000 [drp] | OPHTHALMIC | Status: DC

## 2022-01-14 MED ORDER — ARTIFICIAL TEARS OPHTHALMIC OINT
TOPICAL_OINTMENT | Freq: Every day | OPHTHALMIC | Status: DC
Start: 2022-01-14 — End: 2022-01-14

## 2022-01-14 MED ORDER — PREDNISONE 10 MG PO TABS: ORAL_TABLET | ORAL | 0 refills | Status: AC

## 2022-01-14 MED ORDER — VALACYCLOVIR HCL 500 MG PO TABS: 1000.0000 mg | ORAL_TABLET | Freq: Three times a day (TID) | ORAL | Status: DC

## 2022-01-14 MED ORDER — VALACYCLOVIR HCL 1 G PO TABS: 1000.0000 mg | ORAL_TABLET | Freq: Three times a day (TID) | ORAL | 0 refills | Status: AC

## 2022-01-14 NOTE — Discharge Instructions (Signed)
Take prednisone as prescribed.  You will take a high dose for 5 days, and then each day afterwards you will go down twice lower dose. Take valacyclovir 3 times a day. Use eyedrops during the day and eye ointment with tape at night to prevent your eyes from drying out. Call the ophthalmologist listed below tomorrow morning to set up a follow-up appointment in 1 week.  Follow-up with your primary care doctor for recheck of your symptoms in 3 weeks. Return to the emergency room with any new, worsening, concerning symptoms

## 2022-01-14 NOTE — Telephone Encounter (Signed)
Call from patient's son stating that patient has facial drooping on the left side and cannot move the left side of face since yesterday.  Right eye is swollen as well.  Requesting appointment.  Advised son to take patient to the ER immediately.  Son voiced understanding of plan and will get patient to ER for assessment and treatment.

## 2022-01-14 NOTE — ED Provider Notes (Addendum)
[name]   CSN: [id number] Arrival date & time: 01/14/22  1430     History  Chief Complaint  Patient presents with   Facial Droop    Juan [name] is a 71 y.o. male presenting for evaluation of R sided facial droop.   History obtained using interpreter service.  Patient states around 8:00 last night he had right-sided facial droop, cannot close his eye all the way.  He states symptoms have been constant since, not worsening or improving.  He also reports a headache. He denies vision changes.  He has a history of previous stroke, in which the right side of his face was affected as was his right upper extremity.  He reports his right upper extremity does not feel more weak than normal.  He denies recent fevers, cough, chest pain, shortness of breath, nausea, vomiting, Donnell pain, urinary symptoms, normal bowel movements.  No medication changes.  No recent trauma or injury.  HPI     Home Medications Prior to Admission medications   Medication Sig Start Date End Date Taking? Authorizing Provider  predniSONE (DELTASONE) 10 MG tablet Take 5 tablets (50 mg total) by mouth daily with breakfast for 5 days, THEN 4 tablets (40 mg total) daily with breakfast for 1 day, THEN 3 tablets (30 mg total) daily with breakfast for 1 day, THEN 2 tablets (20 mg total) daily with breakfast for 1 day, THEN 1 tablet (10 mg total) daily with breakfast for 1 day. 01/14/22 01/23/22 Yes Kameren Pargas, PA-C  valACYclovir (VALTREX) 1000 MG tablet Take 1 tablet (1,000 mg total) by mouth 3 (three) times daily for 7 days. 01/14/22 01/21/22 Yes Darrin Apodaca, PA-C  aspirin 81 MG EC tablet Take 1 tablet (81 mg total) by mouth daily. 09/12/20   Yvette RackAgyei, Obed K, MD  atorvastatin (LIPITOR) 80 MG tablet Take 1 tablet (80 mg total) by mouth daily. 09/12/20   Yvette RackAgyei, Obed K, MD  Blood Glucose Monitoring Suppl (FREESTYLE LITE) DEVI Use to check blood sugar up to 7 times a  week 04/30/20   Burns SpainButcher, Elizabeth A, MD  glucose blood (FREESTYLE LITE) test strip Check blood sugar up to 7 times a week as instructed 04/30/20   Burns SpainButcher, Elizabeth A, MD  Lancets (FREESTYLE) lancets Check blood sugar up to 7 times a week as instructed 04/30/20   Burns SpainButcher, Elizabeth A, MD  lisinopril-hydrochlorothiazide (ZESTORETIC) 10-12.5 MG tablet Take 1 tablet by mouth daily. 09/12/20   Yvette RackAgyei, Obed K, MD  sitaGLIPtin-metformin (JANUMET) 50-500 MG tablet Take 1 tablet by mouth 2 (two) times daily with a meal. 09/12/20   Yvette RackAgyei, Obed K, MD      Allergies    Patient has no known allergies.    Review of Systems   Review of Systems  Neurological:  Positive for facial asymmetry and headaches.   Physical Exam Updated Vital Signs BP (!) 152/76    Pulse 76    Temp 98.2 F (36.8 C) (Oral)    Resp 17    SpO2 97%  Physical Exam Vitals and nursing note reviewed.  Constitutional:      General: He is not in acute distress.    Appearance: Normal appearance.     Comments: Resting in the bed in NAD  HENT:     Head: Normocephalic and atraumatic.     Mouth/Throat:     Comments: Unable to close R eyelid completely. No gross visual field deficits, although difficult to assess due to language  barrier Eyes:     Extraocular Movements: Extraocular movements intact.     Conjunctiva/sclera: Conjunctivae normal.     Pupils: Pupils are equal, round, and reactive to light.  Cardiovascular:     Rate and Rhythm: Normal rate and regular rhythm.     Pulses: Normal pulses.  Pulmonary:     Effort: Pulmonary effort is normal. No respiratory distress.     Breath sounds: Normal breath sounds. No wheezing.     Comments: Speaking in full sentences.  Clear lung sounds in all fields. Abdominal:     General: There is no distension.     Palpations: Abdomen is soft. There is no mass.     Tenderness: There is no abdominal tenderness. There is no guarding or rebound.  Musculoskeletal:        General: Normal range of motion.      Cervical back: Normal range of motion and neck supple.  Skin:    General: Skin is warm and dry.     Capillary Refill: Capillary refill takes less than 2 seconds.  Neurological:     Mental Status: He is alert and oriented to person, place, and time.     GCS: GCS eye subscore is 4. GCS verbal subscore is 5. GCS motor subscore is 6.     Cranial Nerves: Facial asymmetry present.     Sensory: Sensory deficit present.     Motor: Weakness present.     Coordination: Finger-Nose-Finger Test abnormal.     Comments: R facial droop. Decreased sensation of the R face along V1 distribution.  RUE weakness, pt has baseline weakness of this arm.  No weakness of RLE.  Difficulty with coordination of the R arm when doing finger to nose.   Psychiatric:        Mood and Affect: Mood and affect normal.        Speech: Speech normal.        Behavior: Behavior normal.    ED Results / Procedures / Treatments   Labs (all labs ordered are listed, but only abnormal results are displayed) Labs Reviewed  CBC - Abnormal; Notable for the following components:      Result Value   MCH 25.8 (*)    All other components within normal limits  COMPREHENSIVE METABOLIC PANEL - Abnormal; Notable for the following components:   Sodium 133 (*)    Chloride 96 (*)    Glucose, Bld 261 (*)    Creatinine, Ser 1.67 (*)    Albumin 3.1 (*)    GFR, Estimated 43 (*)    All other components within normal limits  I-STAT CHEM 8, ED - Abnormal; Notable for the following components:   Sodium 134 (*)    Creatinine, Ser 1.60 (*)    Glucose, Bld 266 (*)    All other components within normal limits  RESP PANEL BY RT-PCR (FLU A&B, COVID) ARPGX2  ETHANOL  PROTIME-INR  APTT  DIFFERENTIAL  RAPID URINE DRUG SCREEN, HOSP PERFORMED  URINALYSIS, ROUTINE W REFLEX MICROSCOPIC    EKG EKG Interpretation  Date/Time:  Wednesday January 14 2022 17:53:10 EST Ventricular Rate:  74 PR Interval:  49 QRS Duration: 126 QT  Interval:  387 QTC Calculation: 430 R Axis:   -34 Text Interpretation: Sinus rhythm Short PR interval Right bundle branch block Left ventricular hypertrophy similar to earlier in the day Confirmed by Pricilla LovelessGoldston, Scott [phone]) on 01/14/2022 5:58:51 PM  Radiology DG Chest 2 View  Result Date: 01/14/2022 CLINICAL DATA:  Stroke,  facial droop RIGHT-side EXAM: CHEST - 2 VIEW COMPARISON:  09/26/2020 FINDINGS: LEFT subclavian sequential transvenous pacemaker leads project at RIGHT atrium and RIGHT ventricle. Enlargement of cardiac silhouette. Mediastinal contours and pulmonary vascularity normal. Bibasilar atelectasis. No acute infiltrate, pleural effusion, or pneumothorax. Prior cervical spine fusion. IMPRESSION: Enlargement of cardiac silhouette with bibasilar atelectasis. No acute infiltrate. Electronically Signed   By: Ulyses Southward M.D.   On: 01/14/2022 17:00   CT HEAD WO CONTRAST  Result Date: 01/14/2022 CLINICAL DATA:  Right facial droop EXAM: CT HEAD WITHOUT CONTRAST TECHNIQUE: Contiguous axial images were obtained from the base of the skull through the vertex without intravenous contrast. RADIATION DOSE REDUCTION: This exam was performed according to the departmental dose-optimization program which includes automated exposure control, adjustment of the mA and/or kV according to patient size and/or use of iterative reconstruction technique. COMPARISON:  CT head 02/06/2020 FINDINGS: Brain: No acute intracranial hemorrhage, mass effect, or herniation. No extra-axial fluid collections. No evidence of acute territorial infarct. No hydrocephalus. Mild cortical volume loss. Patchy hypodensities throughout the periventricular and subcortical white matter, likely secondary to chronic microvascular ischemic changes. Encephalomalacia secondary to old infarcts in the left parietal lobe and occipital lobe as well as small old lacunar infarcts in the bilateral basal ganglia regions. Vascular: Calcified plaques in the  carotid siphons. Skull: Normal. Negative for fracture or focal lesion. Sinuses/Orbits: No acute finding. Other: None. IMPRESSION: 1. No acute intracranial process identified. 2. Chronic changes including multifocal old infarcts as described. Electronically Signed   By: Jannifer Hick M.D.   On: 01/14/2022 15:28    Procedures Procedures    Medications Ordered in ED Medications - No data to display  ED Course/ Medical Decision Making/ A&P                           Medical Decision Making Amount and/or Complexity of Data Reviewed Radiology: ordered.  Risk Prescription drug management.    This patient presents to the ED for concern of R sided facial droop. This involves an extensive number of treatment options, and is a complaint that carries with it a high risk of complications and morbidity.  The differential diagnosis includes stroke, Bell's palsy, TIA, complex migraine, recrudescence of stroke symptoms   Co morbidities:  Previous cva, DM, HTN, HLD   Lab Tests:  I ordered, and personally interpreted labs.  The pertinent results include: Stable hemoglobin.  Electrolytes at baseline.  Reassuring.  Kidney function elevated, but at patient's baseline.  Glucose minimally elevated, but no acidosis to indicate DKA.   Imaging Studies:  I ordered imaging studies including CT head I independently visualized and interpreted imaging which showed no acute findings I agree with the radiologist interpretation   Cardiac Monitoring:  The patient was maintained on a cardiac monitor.  I personally viewed and interpreted the cardiac monitored which showed an underlying rhythm of: nsr   Test Considered:  Ideally patient will get an MRI for evaluation of the stroke, however due to his pacemaker, this would not be an option until tomorrow when there is a rep available to manage the pacemaker device.  Consults:  I requested consultation with the neurology team, discussed with Dr.  Derry Lory.  The neurology team will evaluate the patient   Discussed with Dr. Derry Lory, who feels pt's sxs are most c/w bells, no cva. Recommends d/c home with steroid taper (50x5 days, 40x1day, 30x1 day, 20x1 day, 10x1day), valacyclovir 1000 mg tid x7  days. Recommends using eye gtts during the day, eye ointment at night with tape and a patch, and ophthalmology f/u in 1 wk. Recommends PCP f/u in 3 wks for recheck, and if not improved may need MRI.   Discussed findings and plan with pt and pt's son. At this time, pt appears safe for d/c. Return precautions given. Pt and son state they understand and agree to plan.   Final Clinical Impression(s) / ED Diagnoses Final diagnoses:  Bell's palsy    Rx / DC Orders ED Discharge Orders          Ordered    predniSONE (DELTASONE) 10 MG tablet        01/14/22 1823    valACYclovir (VALTREX) 1000 MG tablet  3 times daily        01/14/22 1823              Alveria Apley, PA-C 01/14/22 1800    Amberlynn Tempesta, PA-C 01/14/22 1829    Pricilla Loveless, MD 01/15/22 1722

## 2022-01-14 NOTE — Consult Note (Signed)
Neurology Consultation  Reason for Consult: Right facial weakness Referring Physician: Dr. Criss AlvineGoldston  CC: Right facial weakness  History is obtained from: Patient, Patient's son at bedside  HPI: Juan [name] is a 71 y.o. 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 Patient states that his facial weakness appeared suddenly last night on 1/17 at approximately 20:00. He states that since last night, he has had some drooling on the right side of his mouth, slurred speech, and is unable to close his right eye fully. He endorses some tearing of the right eye as well. He denies any recent illness or stressors. He states that his right hand is currently at it's baseline functioning and he has not noticed further extremity weakness or sensory deficits.    ROS: A complete ROS was performed and is negative except as noted in the HPI.  Past Medical History:  Diagnosis Date   Carotid artery occlusion    Diabetes mellitus without complication (HCC)    Type II, takes metformin per patient(11/01/15)   Hyperlipemia    Hypertension    Stroke (HCC) 06/2013   Left side- weak, unable to make a fist   Past Surgical History:  Procedure Laterality Date   CAROTID ENDARTERECTOMY Left 08-04-13   ENDARTERECTOMY Left 08/04/2013   Procedure: Left Carotid Endarterectomy with hemashield patch angioplasty;  Surgeon: Larina Earthlyodd F Early, MD;  [facility];  Service: Vascular;  Laterality: Left;   NECK SURGERY  2003   Cord Compression   PACEMAKER IMPLANT N/A 02/07/2020   Procedure: PACEMAKER IMPLANT;  Surgeon: Marinus Mawaylor, Gregg W, MD;  Location: [facility] INVASIVE CV LAB;  Service: Cardiovascular;  Laterality: N/A;   TEE WITHOUT CARDIOVERSION N/A 07/20/2013   Procedure: TRANSESOPHAGEAL ECHOCARDIOGRAM (TEE);  Surgeon: Wendall StadePeter C Nishan, MD;   [facility];  Service: Cardiovascular;  Laterality: N/A;   No family history on file.  Social History:   reports that he quit smoking about 16 years ago. He has quit using smokeless tobacco. He reports that he does not drink alcohol and does not use drugs.  Medications No current facility-administered medications for this encounter.  Current Outpatient Medications:    aspirin 81 MG EC tablet, Take 1 tablet (81 mg total) by mouth daily., Disp: 90 tablet, Rfl: 3   atorvastatin (LIPITOR) 80 MG tablet, Take 1 tablet (80 mg total) by mouth daily., Disp: 90 tablet, Rfl: 3   Blood Glucose Monitoring Suppl (FREESTYLE LITE) DEVI, Use to check blood sugar up to 7 times a week, Disp: 1 each, Rfl: 0   glucose blood (FREESTYLE LITE) test strip, Check blood sugar up to 7 times a week as instructed, Disp: 100 each, Rfl: 3   Lancets (FREESTYLE) lancets, Check blood sugar up to 7 times a week as instructed, Disp: 100 each, Rfl: 3   lisinopril-hydrochlorothiazide (ZESTORETIC) 10-12.5 MG tablet, Take 1 tablet by mouth daily., Disp: 90 tablet, Rfl: 3   sitaGLIPtin-metformin (JANUMET) 50-500 MG tablet, Take 1 tablet by mouth 2 (two) times daily with a meal., Disp: 180 tablet, Rfl: 3  Exam: Current vital signs: BP (!) 152/76    Pulse 76    Temp 98.2 F (36.8 C) (Oral)    Resp 17    SpO2 97%  Vital signs in last 24 hours: Temp:  [98.2 F (36.8 C)] 98.2 F (36.8 C) (  01/18 1441) Pulse Rate:  [72-76] 76 (01/18 1730) Resp:  [16-18] 17 (01/18 1730) BP: (152-169)/(76-77) 152/76 (01/18 1715) SpO2:  [97 %-99 %] 97 % (01/18 1730)  Physical Exam  Constitutional: Appears well-developed and well-nourished.  Psych: Affect appropriate to situation, calm and cooperative with exam Eyes: No scleral injection, arcus senilis bilaterally HENT: No OP obstruction, dry mucous membranes MSK: no joint deformities or edema  Cardiovascular: Normal rate and regular rhythm on telemetry Respiratory: Effort normal,  non-labored breathing on room air GI: Soft. No distension. There is no tenderness.  Skin: WDI   Neuro: Mental Status: Patient is awake, alert, oriented to person, place, month, year, and situation. Patient is able to give a clear and coherent history. No signs of aphasia or neglect. Patient does have some mild dysarthria per family at bedside Cranial Nerves: II: Visual Fields are full. Pupils are equal, round, and reactive to light.  III,IV, VI: EOMI without diplopia, there is some ptosis on the right eye V: Patient endorses decreased sensation to light touch on the right forehead with sensation intact and symmetric below the level of the forehead VII: Complete right facial weakness present. Patient is unable to fully close the right eye VIII: Hearing is intact to voice X: Palate elevates symmetrically XI: Shoulder shrug is symmetric. XII: Tongue protrudes midline without atrophy or fasciculations.  Motor: Tone is normal. Bulk is normal. 5/5 strength was present in all four extremities with some weakness noted to the right hand (chronic from previous stroke). Right hand with 4/5 grip strength, left 5/5. Sensory: Sensation is symmetric to light touch and temperature in the arms and legs. No extinction to DSS present.  Deep Tendon Reflexes: 2+ and symmetric in the biceps and patellae.  Plantars: Toes are downgoing bilaterally.  Cerebellar: FNF and HKS are intact bilaterally  Labs I have reviewed labs in epic and the results pertinent to this consultation are: CBC    Component Value Date/Time   WBC 7.9 01/14/2022 1450   RBC 5.62 01/14/2022 1450   HGB 16.3 01/14/2022 1507   HGB 13.0 02/05/2020 1131   HCT 48.0 01/14/2022 1507   HCT 40.2 02/05/2020 1131   PLT 246 01/14/2022 1450   PLT 246 02/05/2020 1131   MCV 82.0 01/14/2022 1450   MCV 79 02/05/2020 1131   MCH 25.8 (L) 01/14/2022 1450   MCHC 31.5 01/14/2022 1450   RDW 14.2 01/14/2022 1450   RDW 13.2 02/05/2020 1131    LYMPHSABS 3.0 01/14/2022 1450   LYMPHSABS 3.7 (H) 02/05/2020 1131   MONOABS 0.5 01/14/2022 1450   EOSABS 0.5 01/14/2022 1450   EOSABS 1.4 (H) 02/05/2020 1131   BASOSABS 0.1 01/14/2022 1450   BASOSABS 0.1 02/05/2020 1131   CMP     Component Value Date/Time   NA 134 (L) 01/14/2022 1507   NA 141 09/26/2020 1453   K 4.7 01/14/2022 1507   CL 98 01/14/2022 1507   CO2 27 01/14/2022 1450   GLUCOSE 266 (H) 01/14/2022 1507   BUN 15 01/14/2022 1507   BUN 16 09/26/2020 1453   CREATININE 1.60 (H) 01/14/2022 1507   CREATININE 1.04 01/28/2015 1533   CALCIUM 9.6 01/14/2022 1450   PROT 7.2 01/14/2022 1450   PROT 7.6 09/26/2020 1453   ALBUMIN 3.1 (L) 01/14/2022 1450   ALBUMIN 4.0 09/26/2020 1453   AST 21 01/14/2022 1450   ALT 23 01/14/2022 1450   ALKPHOS 79 01/14/2022 1450   BILITOT 0.5 01/14/2022 1450   BILITOT 0.4 09/26/2020 1453  GFRNONAA 43 (L) 01/14/2022 1450   GFRNONAA 76 01/28/2015 1533   GFRAA 54 (L) 09/26/2020 1453   GFRAA 87 01/28/2015 1533   Lipid Panel     Component Value Date/Time   CHOL 154 01/06/2019 1429   TRIG 297 (H) 01/06/2019 1429   HDL 39 (L) 01/06/2019 1429   CHOLHDL 3.9 01/06/2019 1429   CHOLHDL 4.3 10/25/2014 0848   VLDL 36 10/25/2014 0848   LDLCALC 56 01/06/2019 1429   LDLDIRECT 157 (H) 05/12/2016 1045   Imaging I have reviewed the images obtained:  CT-scan of the brain done on 01/14/2022 shows No acute intracranial process and Encephalomalacia secondary to old infarcts in the left parietal lobe and occipital lobe as well as small old lacunar infarcts in the bilateral basal ganglia regions.  Assessment: 71 y/o male with history of prior strokes in 2014, who presented to [facility] ED  for evaluation of right facial weakness and droop which he reported started 8 pm last night. Head CT done shows no acute intracranial process and encephalomalacia secondary to old infarcts in the left parietal lobe and occipital lobe as well as small old lacunar  infarcts in the bilateral basal ganglia regions. - Examination reveals patient with right facial weakness, right eye ptosis, and patient could not close his right eye completely. Patient does have right hand weakness that is chronic and stable from prior stroke. - CT imaging as above. - Presentation is most consistent with Bell's Palsy with low suspicion for acute ischemia.   Impression:  Bell's palsy   Recommendations: - Prednisone 50 mg x 5 days followed by 40 mg x 1 day, 30 mg x 1 day, 20 mg x 1 day, and 10 mg x 1 day then stop - Valacyclovir 1,000 mg PO TID x 7 days - Eye drops during the day, ointment at night - Patient counseled about taping his eye shut at night with use of eye patch and frequent use of eye drops. Patient counseled about importance of avoiding dry eye. - Will need follow up with outpatient ophthalmology within one week - Will need to follow up with PCP in about 3 weeks with consideration of MRI brain if no improvement in symptoms. Specifically to evaluate for CPA angle tumor or schwannoma. - Plan discussed with EDP  Pt seen by NP/Neuro and later by MD. Note/plan to be edited by MD as needed.  Lanae Boast, AGAC-NP [facility] Pager: [phone]  NEUROHOSPITALIST ADDENDUM Performed a face to face diagnostic evaluation.   I have reviewed the contents of history and physical exam as documented by PA/ARNP/Resident and agree with above documentation.  I have discussed and formulated the above plan as documented. Edits to the note have been made as needed.  Impression/Key exam findings/Plan: 37M with hx of prior stroke with residual R facial and RUE weakness who presents with right upper and lower facial droop with facial asymmetry at rest, poor to no movement of R forehead, incomplete right eye closure and slight movement of the mouth on the right. 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 Recommend short course of steroids as  above along with Valacyclovir, artificial tears and eye ointment and tape eyes shut at night with patch and close follow up with ophthalmology to monitor for any corneal dryness within 1 week and follow up with PCP in 2-3 weeks. If his facial droop does not improve, will need an MRI Brain with and without contrast to evaluate for any  CPA angle tumor/schwannoma.  Okay to discharge from Neuro standpoint.  Erick Blinks, MD [facility] [phone]   If 7pm to 7am, please call on call as listed on AMION.

## 2022-01-14 NOTE — ED Triage Notes (Signed)
Pt reports new onset right sided facial droop as of yesterday at 0800 AM. Pt has right sided weakness in UE in 2014.  HX: stroke, DM

## 2022-01-14 NOTE — ED Provider Triage Note (Signed)
Emergency Medicine Provider Triage Evaluation Note  Juan [name] , a 71 y.o. male  was evaluated in triage.  Pt complains of facial droop. He has residual right-sided weakness from previous stroke.  At 8 AM yesterday it was noticed that he had right-sided facial droop.  He denies any other acute deficits.  Review of Systems  Positive: See above Negative:   Physical Exam  BP (!) 169/77 (BP Location: Right Arm)    Pulse 72    Temp 98.2 F (36.8 C) (Oral)    Resp 16    SpO2 99%  Gen:   Awake, no distress   Resp:  Normal effort  MSK:    Right side is weak, difficulty moving.  Left side is weak also however exam is somewhat limited due to triage setting/patient being in wheelchair and previous strokes Other:  Right side of face weakness in upper, mid, and lower face.  Sensation intact to bilateral face to light touch.  Medical Decision Making  Medically screening exam initiated at 2:46 PM.  Appropriate orders placed.  Juan [name] was informed that the remainder of the evaluation will be completed by another provider, this initial triage assessment does not replace that evaluation, and the importance of remaining in the ED until their evaluation is complete.  Patient symptoms have been ongoing for over 24 hours, does not meet code stroke criteria at this time. Due to his previous strokes, along with language barrier despite having interpreter at bedside this is a difficult exam. There is consideration for Bell's palsy given it is the entire side of his face that appears to be unable to move, however with the confounding factors of previous strokes will obtain stroke work-up.  Note: Portions of this report may have been transcribed using voice recognition software. Every effort was made to ensure accuracy; however, inadvertent computerized transcription errors may be present    Juan Glass, PA-C 01/14/22 1449

## 2022-01-29 ENCOUNTER — Ambulatory Visit (INDEPENDENT_AMBULATORY_CARE_PROVIDER_SITE_OTHER): Payer: PPO | Admitting: Internal Medicine

## 2022-01-29 ENCOUNTER — Encounter: Payer: Self-pay | Admitting: Internal Medicine

## 2022-01-29 ENCOUNTER — Other Ambulatory Visit: Payer: Self-pay

## 2022-01-29 DIAGNOSIS — G510 Bell's palsy: Secondary | ICD-10-CM | POA: Insufficient documentation

## 2022-01-29 NOTE — Assessment & Plan Note (Signed)
The patient is here today for follow-up after his ED visit on 1/18.  During that ED visit, patient was found to have Bell's palsy, and he was given steroid taper and valacyclovir after discharge.  He was also referred to ophthalmology due to inability to close his right eye fully.  His family states that his facial droop has improved since last month.  He has finished the steroid taper as well as the valacyclovir regimen.  He has been using eyedrops and ointment as needed.  Patient also feels like he is improving, and he is even able to almost close his right eye which she was not able to do before.  No difficulty swallowing.  No numbness or tingling.  He has not yet seen ophthalmology.  No other concerns today.  Plan: Encouraged the patient to schedule an appointment with ophthalmology soon as possible.  Otherwise, we will continue to monitor as his Bell's palsy seems to have improved since its onset.  Can follow-up for the Bell's palsy as needed, next follow-up will be for a routine checkup in 3-6 months.

## 2022-01-29 NOTE — Patient Instructions (Signed)
Thank you, [name] for allowing [place] to provide your care today. Today we discussed your bells palsy.  I am happy that this has been improving since you were seen in the ED.  Please contact ophthalmology to be seen due to not being able to close your eye fully.  I have ordered the following labs for you:  Lab Orders  No laboratory test(s) ordered today    Referrals ordered today:   Referral Orders  No referral(s) requested today     I have ordered the following medication/changed the following medications:   Stop the following medications: There are no discontinued medications.   Start the following medications: No orders of the defined types were placed in this encounter.    Follow up:  3-6 months for a general check-up (for your diabetes, etc.)     Should you have any questions or concerns please call the internal medicine clinic at [phone].

## 2022-01-29 NOTE — Progress Notes (Signed)
° °  CC: ED follow-up  HPI:  [name] is a 71 y.o. with past medical history as noted below who presents to the clinic today for an ED follow-up. Please see problem-based list for further details, assessments, and plans.  Past Medical History:  Diagnosis Date   Carotid artery occlusion    Diabetes mellitus without complication (HCC)    Type II, takes metformin per patient(11/01/15)   Hyperlipemia    Hypertension    Stroke (HCC) 06/2013   Left side- weak, unable to make a fist   Review of Systems: Negative aside from that listed in individualized problem based charting.  Physical Exam:  Vitals:   01/29/22 1202  BP: 118/68  Pulse: 76  Temp: 98.1 F (36.7 C)  TempSrc: Oral  SpO2: 98%  Weight: 149 lb 11.2 oz (67.9 kg)  Height: 5\' 5"  (1.651 m)   General: NAD, nl appearance HE: Normocephalic, atraumatic, EOMI, Conjunctivae normal ENT: No congestion, no rhinorrhea, no exudate or erythema, able to fully close L eye, can partially close the R eye Cardiovascular: Normal rate, regular rhythm. No murmurs, rubs, or gallops Pulmonary: Effort normal, breath sounds normal. No wheezes, rales, or rhonchi Abdominal: soft, nontender, bowel sounds present Musculoskeletal: no swelling, deformity, injury or tenderness in extremities Skin: Warm, dry, no bruising, erythema, or rash Psychiatric/Behavioral: normal mood, normal behavior   Neurologic exam: Cranial Nerves:             V, VII: Right facial droop is present, sensation is intact in all 3 divisions               IX, X: palate rises symmetrically             XII: tongue midline      Assessment & Plan:   See Encounters Tab for problem based charting.  Patient discussed with Dr. 

## 2022-01-30 NOTE — Progress Notes (Signed)
Internal Medicine Clinic Attending ° °Case discussed with Dr. Bonanno  °  At the time of the visit.  We reviewed the resident’s history and exam and pertinent patient test results.  I agree with the assessment, diagnosis, and plan of care documented in the resident’s note. ° °

## 2022-02-06 ENCOUNTER — Ambulatory Visit (INDEPENDENT_AMBULATORY_CARE_PROVIDER_SITE_OTHER): Payer: PPO

## 2022-02-06 DIAGNOSIS — I441 Atrioventricular block, second degree: Secondary | ICD-10-CM

## 2022-02-07 LAB — CUP PACEART REMOTE DEVICE CHECK
Battery Remaining Longevity: 110 mo
Battery Voltage: 3 V
Brady Statistic AP VP Percent: 4.28 %
Brady Statistic AP VS Percent: 0.02 %
Brady Statistic AS VP Percent: 93.86 %
Brady Statistic AS VS Percent: 1.85 %
Brady Statistic RA Percent Paced: 4.76 %
Brady Statistic RV Percent Paced: 98.14 %
Date Time Interrogation Session: 20230210015449
Implantable Lead Implant Date: 20210210
Implantable Lead Implant Date: 20210210
Implantable Lead Location: 753859
Implantable Lead Location: 753860
Implantable Lead Model: 3830
Implantable Lead Model: 5076
Implantable Pulse Generator Implant Date: 20210210
Lead Channel Impedance Value: 304 Ohm
Lead Channel Impedance Value: 380 Ohm
Lead Channel Impedance Value: 418 Ohm
Lead Channel Impedance Value: 589 Ohm
Lead Channel Pacing Threshold Amplitude: 0.625 V
Lead Channel Pacing Threshold Amplitude: 0.625 V
Lead Channel Pacing Threshold Pulse Width: 0.4 ms
Lead Channel Pacing Threshold Pulse Width: 0.4 ms
Lead Channel Sensing Intrinsic Amplitude: 31.625 mV
Lead Channel Sensing Intrinsic Amplitude: 31.625 mV
Lead Channel Sensing Intrinsic Amplitude: 6 mV
Lead Channel Sensing Intrinsic Amplitude: 6 mV
Lead Channel Setting Pacing Amplitude: 1.5 V
Lead Channel Setting Pacing Amplitude: 2.5 V
Lead Channel Setting Pacing Pulse Width: 0.4 ms
Lead Channel Setting Sensing Sensitivity: 2.8 mV

## 2022-02-10 NOTE — Progress Notes (Signed)
Remote pacemaker transmission.   

## 2022-04-25 IMAGING — CT CT HEAD W/O CM
4 series · 16 of 47 positions shown, 18 images · non-contrast
Comparison: CT head 02/06/2020

CLINICAL DATA: Right facial droop



[Series 3: head without · axial · non-contrast · 0.42mm/px · z∈[-148,-28]mm · 7 of 34 slices shown, 9 images]
[im 5/34  brain]
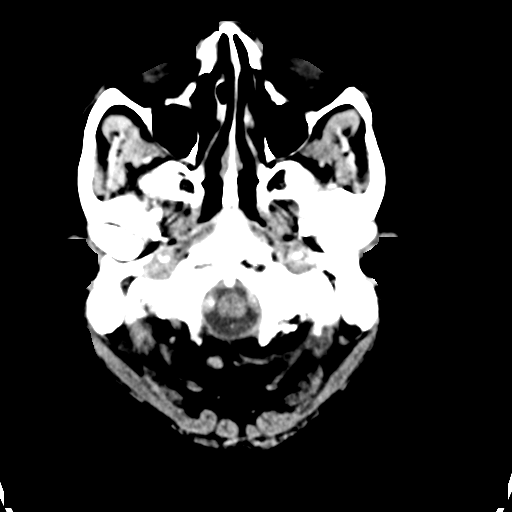
[im 5/34  bone]
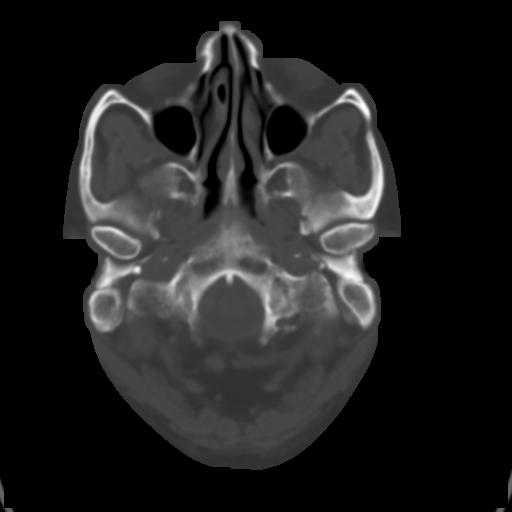
[im 9/34  brain]
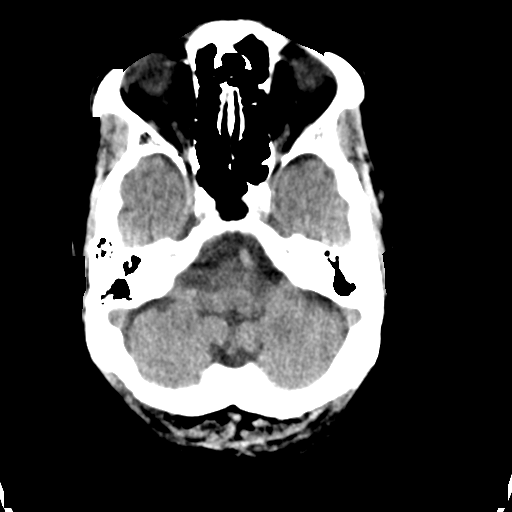
[im 13/34  brain]
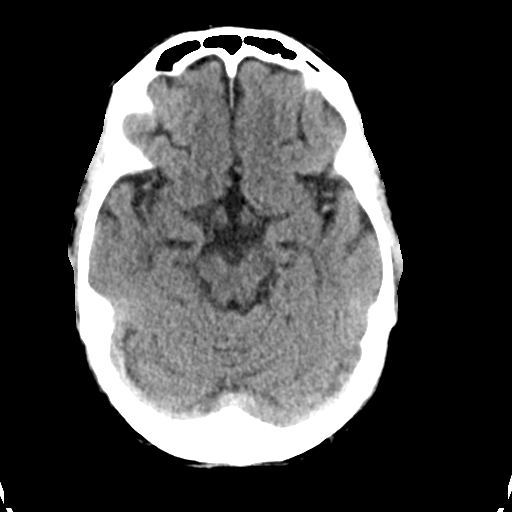
[im 17/34  brain]
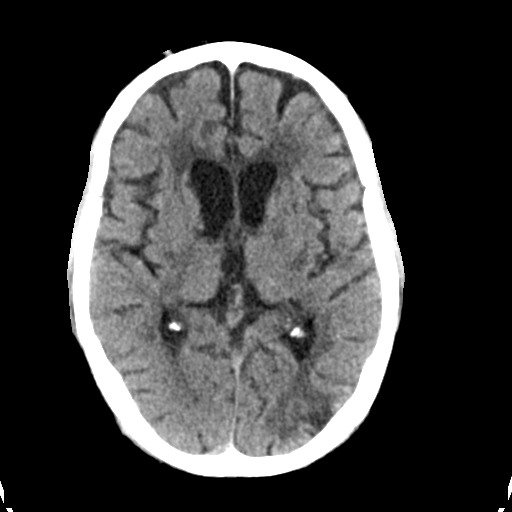
[im 21/34  brain]
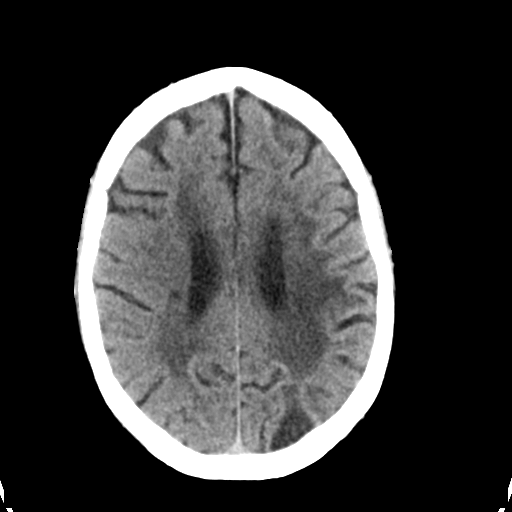
[im 21/34  bone]
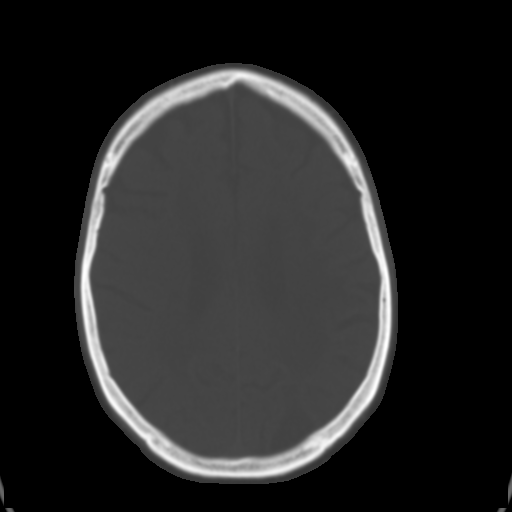
[im 25/34  brain]
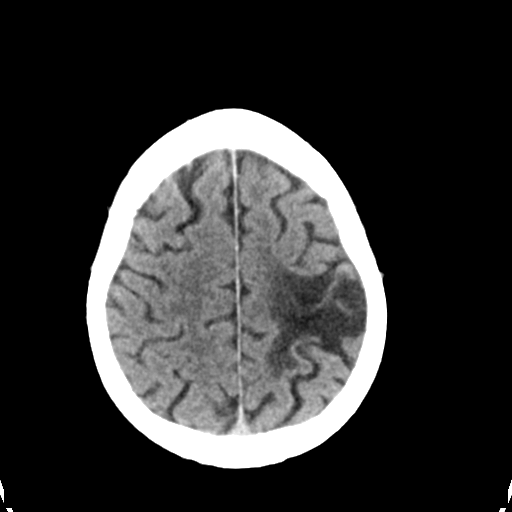
[im 29/34  brain]
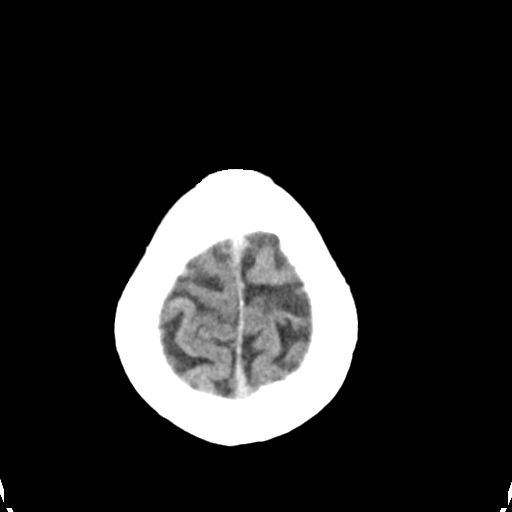

[Series 4: head bone · axial · 0.42mm/px · z∈[-152,-118]mm · 3 of 85 slices shown]
[im 9/85  bone]
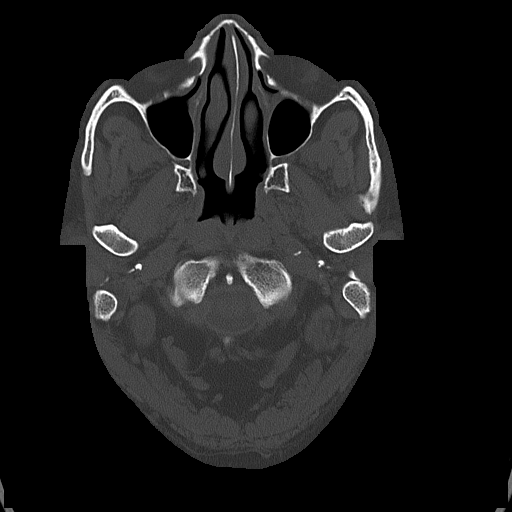
[im 17/85  bone]
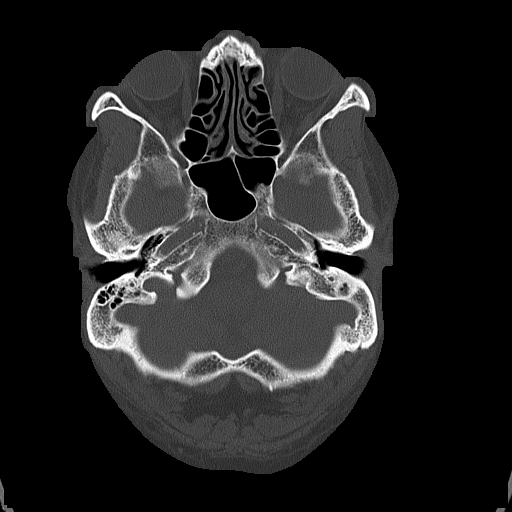
[im 26/85  bone]
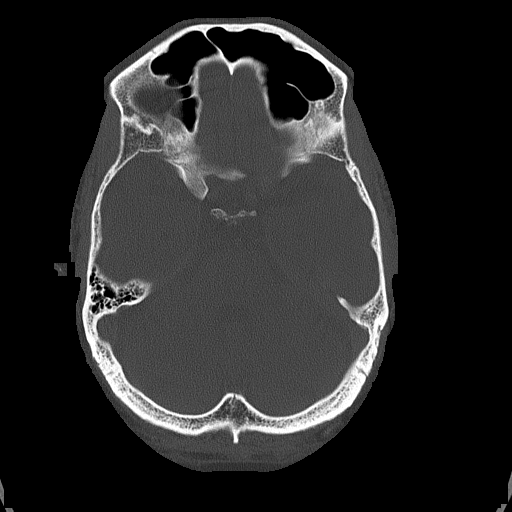

[Series 5: head without cor · coronal · non-contrast · 0.33mm/px · 3 of 67 slices shown]
[im 24/67  brain]
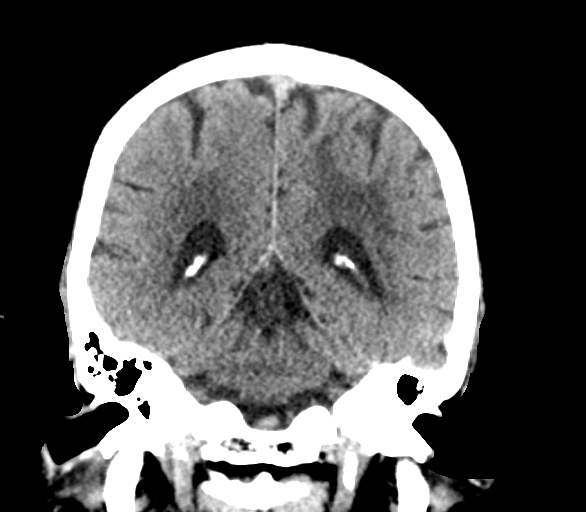
[im 30/67  brain]
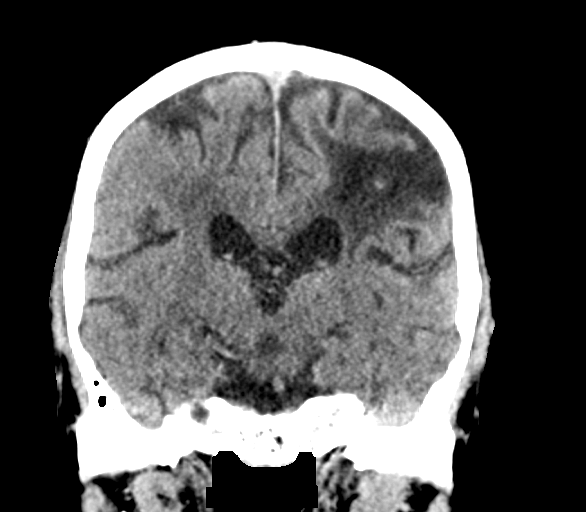
[im 37/67  brain]
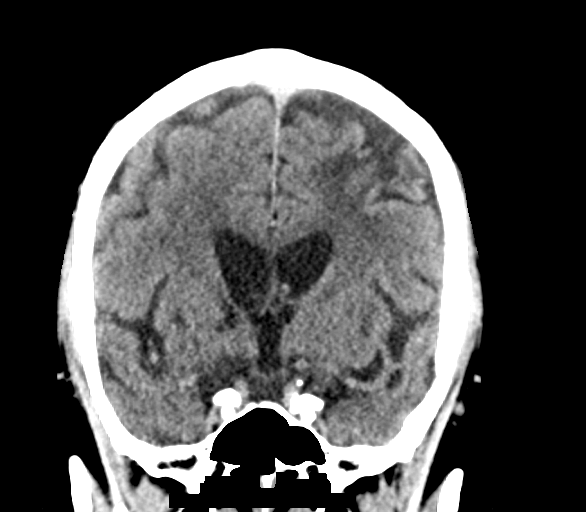

[Series 6: head without sag · sagittal · non-contrast · 0.35mm/px · 3 of 67 slices shown]
[im 23/67  brain]
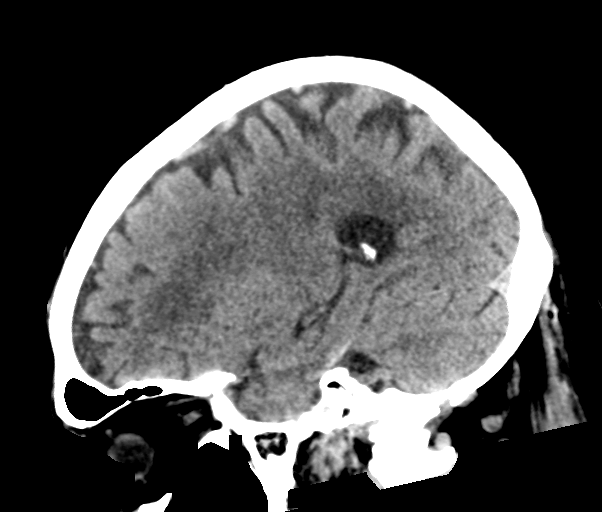
[im 34/67  brain]
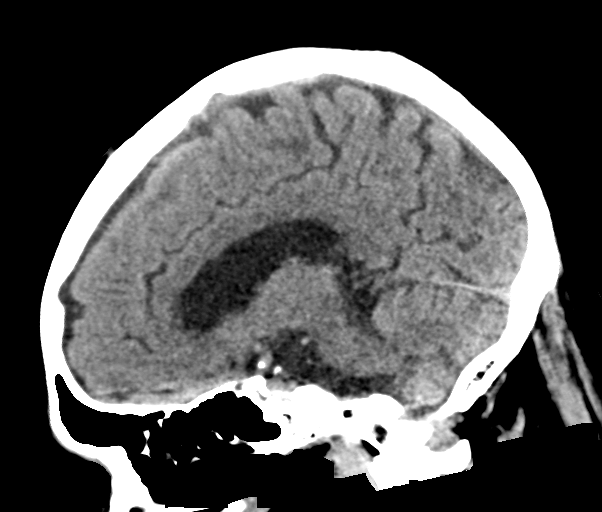
[im 45/67  brain]
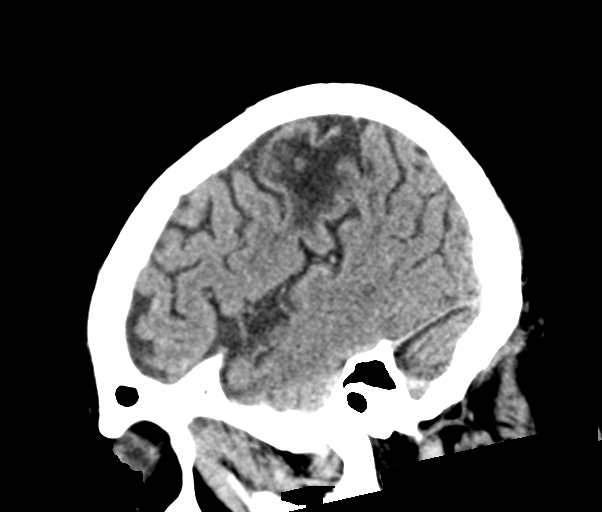

[16 of 47 positions shown; findings below may reference images not displayed]

FINDINGS: Brain: No acute intracranial hemorrhage, mass effect, or herniation.
No extra-axial fluid collections. No evidence of acute territorial
infarct. No hydrocephalus. Mild cortical volume loss. Patchy
hypodensities throughout the periventricular and subcortical white
matter, likely secondary to chronic microvascular ischemic changes.
Encephalomalacia secondary to old infarcts in the left parietal lobe
and occipital lobe as well as small old lacunar infarcts in the
bilateral basal ganglia regions.

Vascular: Calcified plaques in the carotid siphons.

Skull: Normal. Negative for fracture or focal lesion.

Sinuses/Orbits: No acute finding.

Other: None.
IMPRESSION: 1. No acute intracranial process identified.
2. Chronic changes including multifocal old infarcts as described.

## 2022-05-05 ENCOUNTER — Encounter: Payer: PPO | Admitting: Internal Medicine

## 2022-05-07 ENCOUNTER — Encounter: Payer: Self-pay | Admitting: Internal Medicine

## 2022-05-08 ENCOUNTER — Ambulatory Visit (INDEPENDENT_AMBULATORY_CARE_PROVIDER_SITE_OTHER): Payer: PPO

## 2022-05-08 DIAGNOSIS — I441 Atrioventricular block, second degree: Secondary | ICD-10-CM

## 2022-05-08 LAB — CUP PACEART REMOTE DEVICE CHECK
Battery Remaining Longevity: 107 mo
Battery Voltage: 3 V
Brady Statistic AP VP Percent: 3.08 %
Brady Statistic AP VS Percent: 0.01 %
Brady Statistic AS VP Percent: 95.15 %
Brady Statistic AS VS Percent: 1.76 %
Brady Statistic RA Percent Paced: 3.55 %
Brady Statistic RV Percent Paced: 98.23 %
Date Time Interrogation Session: 20230512001516
Implantable Lead Implant Date: 20210210
Implantable Lead Implant Date: 20210210
Implantable Lead Location: 753859
Implantable Lead Location: 753860
Implantable Lead Model: 3830
Implantable Lead Model: 5076
Implantable Pulse Generator Implant Date: 20210210
Lead Channel Impedance Value: 323 Ohm
Lead Channel Impedance Value: 380 Ohm
Lead Channel Impedance Value: 418 Ohm
Lead Channel Impedance Value: 570 Ohm
Lead Channel Pacing Threshold Amplitude: 0.625 V
Lead Channel Pacing Threshold Amplitude: 0.625 V
Lead Channel Pacing Threshold Pulse Width: 0.4 ms
Lead Channel Pacing Threshold Pulse Width: 0.4 ms
Lead Channel Sensing Intrinsic Amplitude: 31.625 mV
Lead Channel Sensing Intrinsic Amplitude: 31.625 mV
Lead Channel Sensing Intrinsic Amplitude: 6.125 mV
Lead Channel Sensing Intrinsic Amplitude: 6.125 mV
Lead Channel Setting Pacing Amplitude: 1.5 V
Lead Channel Setting Pacing Amplitude: 2.5 V
Lead Channel Setting Pacing Pulse Width: 0.4 ms
Lead Channel Setting Sensing Sensitivity: 2.8 mV

## 2022-05-11 ENCOUNTER — Encounter: Payer: PPO | Admitting: Internal Medicine

## 2022-05-11 NOTE — Assessment & Plan Note (Deleted)
Consider SGLT-2 or GLP-1

## 2022-05-12 ENCOUNTER — Other Ambulatory Visit: Payer: Self-pay

## 2022-05-12 ENCOUNTER — Encounter: Payer: Self-pay | Admitting: Internal Medicine

## 2022-05-12 ENCOUNTER — Ambulatory Visit (INDEPENDENT_AMBULATORY_CARE_PROVIDER_SITE_OTHER): Payer: Medicare Other | Admitting: Internal Medicine

## 2022-05-12 VITALS — BP 179/75 | HR 71 | Temp 97.7°F | Ht 65.0 in | Wt 156.3 lb

## 2022-05-12 DIAGNOSIS — E1122 Type 2 diabetes mellitus with diabetic chronic kidney disease: Secondary | ICD-10-CM

## 2022-05-12 DIAGNOSIS — E1151 Type 2 diabetes mellitus with diabetic peripheral angiopathy without gangrene: Secondary | ICD-10-CM

## 2022-05-12 DIAGNOSIS — E1169 Type 2 diabetes mellitus with other specified complication: Secondary | ICD-10-CM | POA: Diagnosis not present

## 2022-05-12 DIAGNOSIS — Z87891 Personal history of nicotine dependence: Secondary | ICD-10-CM | POA: Diagnosis not present

## 2022-05-12 DIAGNOSIS — G510 Bell's palsy: Secondary | ICD-10-CM | POA: Diagnosis not present

## 2022-05-12 DIAGNOSIS — I10 Essential (primary) hypertension: Secondary | ICD-10-CM

## 2022-05-12 DIAGNOSIS — E785 Hyperlipidemia, unspecified: Secondary | ICD-10-CM | POA: Diagnosis not present

## 2022-05-12 DIAGNOSIS — Z7984 Long term (current) use of oral hypoglycemic drugs: Secondary | ICD-10-CM

## 2022-05-12 DIAGNOSIS — N1832 Chronic kidney disease, stage 3b: Secondary | ICD-10-CM | POA: Diagnosis not present

## 2022-05-12 DIAGNOSIS — I129 Hypertensive chronic kidney disease with stage 1 through stage 4 chronic kidney disease, or unspecified chronic kidney disease: Secondary | ICD-10-CM

## 2022-05-12 LAB — POCT GLYCOSYLATED HEMOGLOBIN (HGB A1C): Hemoglobin A1C: 10.4 % — AB (ref 4.0–5.6)

## 2022-05-12 LAB — GLUCOSE, CAPILLARY: Glucose-Capillary: 469 mg/dL — ABNORMAL HIGH (ref 70–99)

## 2022-05-12 MED ORDER — FREESTYLE LITE TEST VI STRP: ORAL_STRIP | 3 refills | Status: DC

## 2022-05-12 MED ORDER — ATORVASTATIN CALCIUM 80 MG PO TABS: 80.0000 mg | ORAL_TABLET | Freq: Every day | ORAL | 3 refills | Status: DC

## 2022-05-12 MED ORDER — JANUMET 50-500 MG PO TABS: 1.0000 | ORAL_TABLET | Freq: Two times a day (BID) | ORAL | 3 refills | Status: DC

## 2022-05-12 MED ORDER — LISINOPRIL-HYDROCHLOROTHIAZIDE 10-12.5 MG PO TABS: 1.0000 | ORAL_TABLET | Freq: Every day | ORAL | 3 refills | Status: DC

## 2022-05-12 MED ORDER — FREESTYLE LANCETS MISC: 3 refills | Status: DC

## 2022-05-12 MED ORDER — FREESTYLE LITE DEVI: 0 refills | Status: DC

## 2022-05-12 MED ORDER — ASPIRIN 81 MG PO TBEC: 81.0000 mg | DELAYED_RELEASE_TABLET | Freq: Every day | ORAL | 3 refills | Status: DC

## 2022-05-12 NOTE — Progress Notes (Signed)
? ?  CC: BP check, diabetes management, medication refill ? ?HPI: ? ?[name] is a 71 y.o. male with a past medical history stated below and presents today for cc listed above. Patient present with son, who translated during the visit. Please see problem based assessment and plan for additional details. ? ?Past Medical History:  ?Diagnosis Date  ? Carotid artery occlusion   ? Diabetes mellitus without complication ([place])   ? Type II, takes metformin per patient(11/01/15)  ? Hyperlipemia   ? Hypertension   ? Stroke [facility]) 06/2013  ? Left side- weak, unable to make a fist  ? ? ?No current outpatient medications on file prior to visit.  ? ?No current facility-administered medications on file prior to visit.  ? ? ?No family history on file. ? ?Social History  ? ?Socioeconomic History  ? Marital status: Married  ?  Spouse name: Not on file  ? Number of children: 4  ? Years of education: 0  ? Highest education level: 6th grade  ?Occupational History  ? Occupation: Retired  ?Tobacco Use  ? Smoking status: Former  ?  Years: 50.00  ?  Types: Cigarettes  ?  Quit date: 12/28/2005  ?  Years since quitting: 16.3  ? Smokeless tobacco: Former  ?Substance and Sexual Activity  ? Alcohol use: No  ?  Alcohol/week: 0.0 standard drinks  ?  Comment: a few drinks occasionally   ? Drug use: No  ? Sexual activity: Not on file  ?Other Topics Concern  ? Not on file  ?Social History Narrative  ? Lives at home with wife, son, and grandchildren.  ? ?Social Determinants of Health  ? ?Financial Resource Strain: Not on file  ?Food Insecurity: Not on file  ?Transportation Needs: Not on file  ?Physical Activity: Not on file  ?Stress: Not on file  ?Social Connections: Not on file  ?Intimate Partner Violence: Not on file  ? ? ?Review of Systems: ?ROS negative except for what is noted on the assessment and plan. ? ?Vitals:  ? 05/12/22 1006 05/12/22 1014 05/12/22 1042  ?BP: (!) 158/64 (!) 152/71 (!) 179/75  ?Pulse: 76 69 71  ?Temp: 97.7 ?F (36.5 ?C)     ?TempSrc: Oral    ?SpO2: 99%    ?Weight: 156 lb 4.8 oz (70.9 kg)    ?Height: 5\' 5"  (1.651 m)    ? ? ? ?Physical Exam: ?Constitutional: well-appearing elderly man sitting in the chair, in no acute distress ?HENT: normocephalic atraumatic, mucous membranes moist ?Eyes: conjunctiva non-erythematous ?Neck: supple ?Cardiovascular: regular rate and rhythm, no m/r/g; +2 pitting edema of the bilateral lower extremity ?Pulmonary/Chest: normal work of breathing on room air, lungs clear to auscultation bilaterally ?Abdominal: soft, protuberant, non-tender, non-distended ?MSK: normal bulk and tone ?Neurological: alert & oriented x 3, 5/5 strength in bilateral upper and lower extremities, normal gait ?Skin: warm and dry ?Psych: Normal mood ? ? ?Assessment & Plan:  ? ?See Encounters Tab for problem based charting. ? ?Patient discussed with Dr. Angelia Mould ?Timothy Lasso, M.D. ?[facility] Internal Medicine, PGY-1 ?Pager: [phone], Phone: [phone] ?Date 05/12/2022 Time 1:36 PM  ?

## 2022-05-12 NOTE — Assessment & Plan Note (Addendum)
Most recent CMP obtained January 2023 revealed elevated creatinine 1.67 with a GFR of 43; consistent with chronic kidney disease stage IIIb.  Patient currently has uncontrolled hypertension and uncontrolled type 2 diabetes contributing to chronic kidney disease.  Patient has been restarted on antihypertensives and antihyperglycemic agents. ? ? ?P: ?-Follow-up urine microalbumin/creatinine ratio ?-Repeat BMP in 6 months ?-Consider nephrology referral if kidney function worsens ?

## 2022-05-12 NOTE — Assessment & Plan Note (Addendum)
Home medication include: Zestoretic 10-12.5mg . Per chart review, it appears this patient has not picked up this medication since September  2021. He brought in medications bottles with him and they were all empty. And dated back to 2021.  On today's visit patient blood pressure was not at goal, elevated at 152/71 and on repeat check 179/75.  Patient was asymptomatic.  Uncontrolled hypertension is secondary to medication nonadherence.  Will restart Zestoretic. ? ? ?P: ?-Zestoretic 10-12.5 mg daily ?-Diabetic foot exam obtained today ?

## 2022-05-12 NOTE — Assessment & Plan Note (Signed)
Last lipid panel was obtained 3 years ago which revealed elevated triglyceride levels.  Patient has an extensive CVD history; ASCVD risk 55.7 to 60.9%.  Patient was started on atorvastatin 80 mg daily several years ago.  Per chart review, patient has not picked up this medication since September 2021.  Patient was counseled on the importance of being on a statin medication.  As he is high risk for cardiovascular event. ? ?P: ?-Restarted on atorvastatin 80 mg daily ?-Lipid panel ordered today ?

## 2022-05-12 NOTE — Patient Instructions (Signed)
Thank you, [name] for allowing [place] to provide your care today.    ? ?I have ordered the following labs for you: ? ?Lab Orders    ?     Lipid Profile    ?     Glucose, capillary    ?     Microalbumin / Creatinine Urine Ratio    ?     POC Hbg A1C     ? ?Tests ordered today: ? ?None ? ?Referrals ordered today:  ? ?Referral Orders    ?     Ambulatory referral to Ophthalmology    ?  ? ?I have ordered the following medication/changed the following medications:  ? ?Stop the following medications: ?Medications Discontinued During This Encounter  ?Medication Reason  ? glucose blood (FREESTYLE LITE) test strip Reorder  ? Lancets (FREESTYLE) lancets Reorder  ? Blood Glucose Monitoring Suppl (FREESTYLE LITE) DEVI Reorder  ? lisinopril-hydrochlorothiazide (ZESTORETIC) 10-12.5 MG tablet Reorder  ? sitaGLIPtin-metformin (JANUMET) 50-500 MG tablet Reorder  ? atorvastatin (LIPITOR) 80 MG tablet Reorder  ? aspirin 81 MG EC tablet Reorder  ?  ? ?Start the following medications: ?Meds ordered this encounter  ?Medications  ? aspirin 81 MG EC tablet  ?  Sig: Take 1 tablet (81 mg total) by mouth daily.  ?  Dispense:  90 tablet  ?  Refill:  3  ? atorvastatin (LIPITOR) 80 MG tablet  ?  Sig: Take 1 tablet (80 mg total) by mouth daily.  ?  Dispense:  90 tablet  ?  Refill:  3  ? Blood Glucose Monitoring Suppl (FREESTYLE LITE) DEVI  ?  Sig: Use to check blood sugar up to 7 times a week  ?  Dispense:  1 each  ?  Refill:  0  ?  The patient is not insulin requiring, ICD 10 code E11.65. The patient tests 1 times per day.  ? glucose blood (FREESTYLE LITE) test strip  ?  Sig: Check blood sugar up to 7 times a week as instructed  ?  Dispense:  100 each  ?  Refill:  3  ?  The patient is not insulin requiring, ICD 10 code E11.65. The patient tests 1 times per day.  ? Lancets (FREESTYLE) lancets  ?  Sig: Check blood sugar up to 7 times a week as instructed  ?  Dispense:  100 each  ?  Refill:  3  ?  The patient is not insulin requiring, ICD 10 code  E11.65. The patient tests 1 times per day.  ? lisinopril-hydrochlorothiazide (ZESTORETIC) 10-12.5 MG tablet  ?  Sig: Take 1 tablet by mouth daily.  ?  Dispense:  90 tablet  ?  Refill:  3  ? sitaGLIPtin-metformin (JANUMET) 50-500 MG tablet  ?  Sig: Take 1 tablet by mouth 2 (two) times daily with a meal.  ?  Dispense:  180 tablet  ?  Refill:  3  ?  ? ?Follow up: 3 months  ? ?Remember: Please take all your medications as directed ? ?Should you have any questions or concerns please call the internal medicine clinic at [phone].   ? ?Dellis Filbert, MD ?[facility] ?  ?

## 2022-05-12 NOTE — Assessment & Plan Note (Signed)
Patient reports mostly resolution of his symptoms.  He notes his right eye still does not fully closed and noted some tearing of that same eye.  Otherwise no changes in his vision.  On physical exam no facial asymmetry noted. ? ? ?P: ?-Ophthalmology referral sent ?

## 2022-05-12 NOTE — Assessment & Plan Note (Addendum)
Home medication include Janumet 50-500 mg twice daily.  Per chart review, this medication is last been picked up September 2021.  Patient presented with medication bottles which were empty.  A1c obtained today was elevated at10.4% up from 6.9% 1 year ago.  He endorsed polyuria and polydipsia.  Which is likely related to his uncontrolled diabetes.  Given patient simply has not been taking his medication for over a year, will restart Janumet.  He was counseled extensively on the importance of taking his diabetic medication and that his symptoms are related to his uncontrolled diabetes. ? ? ?P: ?-Janumet 50-500 mg twice daily ? ?

## 2022-05-13 LAB — MICROALBUMIN / CREATININE URINE RATIO
Creatinine, Urine: 58.1 mg/dL
Microalb/Creat Ratio: 1385 mg/g creat — ABNORMAL HIGH (ref 0–29)
Microalbumin, Urine: 804.5 ug/mL

## 2022-05-13 LAB — LIPID PANEL
Chol/HDL Ratio: 8.2 ratio — ABNORMAL HIGH (ref 0.0–5.0)
Cholesterol, Total: 320 mg/dL — ABNORMAL HIGH (ref 100–199)
HDL: 39 mg/dL — ABNORMAL LOW (ref >39)
LDL Chol Calc (NIH): 188 mg/dL — ABNORMAL HIGH (ref 0–99)
Triglycerides: 452 mg/dL — ABNORMAL HIGH (ref 0–149)
VLDL Cholesterol Cal: 93 mg/dL — ABNORMAL HIGH (ref 5–40)

## 2022-05-13 NOTE — Progress Notes (Signed)
Internal Medicine Clinic Attending  Case discussed with the resident at the time of the visit.  We reviewed the resident's history and exam and pertinent patient test results.  I agree with the assessment, diagnosis, and plan of care documented in the resident's note.  

## 2022-05-14 NOTE — Progress Notes (Signed)
Remote pacemaker transmission.   

## 2022-06-03 DIAGNOSIS — G510 Bell's palsy: Secondary | ICD-10-CM | POA: Diagnosis not present

## 2022-06-03 DIAGNOSIS — H43812 Vitreous degeneration, left eye: Secondary | ICD-10-CM | POA: Diagnosis not present

## 2022-06-03 DIAGNOSIS — E119 Type 2 diabetes mellitus without complications: Secondary | ICD-10-CM | POA: Diagnosis not present

## 2022-06-03 DIAGNOSIS — H25813 Combined forms of age-related cataract, bilateral: Secondary | ICD-10-CM | POA: Diagnosis not present

## 2022-06-03 LAB — HM DIABETES EYE EXAM

## 2022-07-16 ENCOUNTER — Encounter: Payer: Self-pay | Admitting: Dietician

## 2022-08-07 ENCOUNTER — Ambulatory Visit (INDEPENDENT_AMBULATORY_CARE_PROVIDER_SITE_OTHER): Payer: Medicare Other

## 2022-08-07 DIAGNOSIS — I441 Atrioventricular block, second degree: Secondary | ICD-10-CM | POA: Diagnosis not present

## 2022-08-07 LAB — CUP PACEART REMOTE DEVICE CHECK
Battery Remaining Longevity: 104 mo
Battery Voltage: 3 V
Brady Statistic AP VP Percent: 2.38 %
Brady Statistic AP VS Percent: 0.02 %
Brady Statistic AS VP Percent: 96.28 %
Brady Statistic AS VS Percent: 1.32 %
Brady Statistic RA Percent Paced: 2.7 %
Brady Statistic RV Percent Paced: 98.66 %
Date Time Interrogation Session: 20230810234434
Implantable Lead Implant Date: 20210210
Implantable Lead Implant Date: 20210210
Implantable Lead Location: 753859
Implantable Lead Location: 753860
Implantable Lead Model: 3830
Implantable Lead Model: 5076
Implantable Pulse Generator Implant Date: 20210210
Lead Channel Impedance Value: 304 Ohm
Lead Channel Impedance Value: 380 Ohm
Lead Channel Impedance Value: 418 Ohm
Lead Channel Impedance Value: 570 Ohm
Lead Channel Pacing Threshold Amplitude: 0.5 V
Lead Channel Pacing Threshold Amplitude: 0.625 V
Lead Channel Pacing Threshold Pulse Width: 0.4 ms
Lead Channel Pacing Threshold Pulse Width: 0.4 ms
Lead Channel Sensing Intrinsic Amplitude: 31.625 mV
Lead Channel Sensing Intrinsic Amplitude: 31.625 mV
Lead Channel Sensing Intrinsic Amplitude: 6.375 mV
Lead Channel Sensing Intrinsic Amplitude: 6.375 mV
Lead Channel Setting Pacing Amplitude: 1.5 V
Lead Channel Setting Pacing Amplitude: 2.5 V
Lead Channel Setting Pacing Pulse Width: 0.4 ms
Lead Channel Setting Sensing Sensitivity: 2.8 mV

## 2022-08-13 ENCOUNTER — Other Ambulatory Visit: Payer: Self-pay

## 2022-08-13 DIAGNOSIS — E1151 Type 2 diabetes mellitus with diabetic peripheral angiopathy without gangrene: Secondary | ICD-10-CM

## 2022-08-13 DIAGNOSIS — I10 Essential (primary) hypertension: Secondary | ICD-10-CM

## 2022-08-13 DIAGNOSIS — E1169 Type 2 diabetes mellitus with other specified complication: Secondary | ICD-10-CM

## 2022-08-13 MED ORDER — ATORVASTATIN CALCIUM 80 MG PO TABS: 80.0000 mg | ORAL_TABLET | Freq: Every day | ORAL | 0 refills | Status: DC

## 2022-08-13 MED ORDER — LISINOPRIL-HYDROCHLOROTHIAZIDE 10-12.5 MG PO TABS: 1.0000 | ORAL_TABLET | Freq: Every day | ORAL | 0 refills | Status: DC

## 2022-08-13 MED ORDER — JANUMET 50-500 MG PO TABS: 1.0000 | ORAL_TABLET | Freq: Two times a day (BID) | ORAL | 0 refills | Status: DC

## 2022-08-13 NOTE — Telephone Encounter (Signed)
Approved 90 day refill. Please have patient schedule follow up.

## 2022-08-31 DIAGNOSIS — W1830XA Fall on same level, unspecified, initial encounter: Secondary | ICD-10-CM | POA: Diagnosis not present

## 2022-08-31 DIAGNOSIS — M25531 Pain in right wrist: Secondary | ICD-10-CM | POA: Diagnosis not present

## 2022-08-31 DIAGNOSIS — M79641 Pain in right hand: Secondary | ICD-10-CM | POA: Diagnosis not present

## 2022-09-01 NOTE — Progress Notes (Signed)
Remote pacemaker transmission.   

## 2022-09-07 DIAGNOSIS — W1830XD Fall on same level, unspecified, subsequent encounter: Secondary | ICD-10-CM | POA: Diagnosis not present

## 2022-09-07 DIAGNOSIS — M25531 Pain in right wrist: Secondary | ICD-10-CM | POA: Diagnosis not present

## 2022-09-08 ENCOUNTER — Ambulatory Visit (HOSPITAL_COMMUNITY)
Admission: RE | Admit: 2022-09-08 | Discharge: 2022-09-08 | Disposition: A | Discharge status: Home / Self Care | Payer: Medicare Other | Source: Ambulatory Visit | Attending: Internal Medicine | Admitting: Internal Medicine

## 2022-09-08 ENCOUNTER — Ambulatory Visit (INDEPENDENT_AMBULATORY_CARE_PROVIDER_SITE_OTHER): Payer: Medicare Other | Admitting: Internal Medicine

## 2022-09-08 VITALS — BP 127/74 | HR 66 | Temp 98.1°F | Ht 65.0 in | Wt 151.9 lb

## 2022-09-08 DIAGNOSIS — E1151 Type 2 diabetes mellitus with diabetic peripheral angiopathy without gangrene: Secondary | ICD-10-CM | POA: Diagnosis not present

## 2022-09-08 DIAGNOSIS — I10 Essential (primary) hypertension: Secondary | ICD-10-CM

## 2022-09-08 DIAGNOSIS — Z87891 Personal history of nicotine dependence: Secondary | ICD-10-CM

## 2022-09-08 DIAGNOSIS — I129 Hypertensive chronic kidney disease with stage 1 through stage 4 chronic kidney disease, or unspecified chronic kidney disease: Secondary | ICD-10-CM | POA: Diagnosis not present

## 2022-09-08 DIAGNOSIS — M25531 Pain in right wrist: Secondary | ICD-10-CM

## 2022-09-08 DIAGNOSIS — Z0000 Encounter for general adult medical examination without abnormal findings: Secondary | ICD-10-CM | POA: Insufficient documentation

## 2022-09-08 DIAGNOSIS — E1169 Type 2 diabetes mellitus with other specified complication: Secondary | ICD-10-CM

## 2022-09-08 DIAGNOSIS — S62309A Unspecified fracture of unspecified metacarpal bone, initial encounter for closed fracture: Secondary | ICD-10-CM | POA: Insufficient documentation

## 2022-09-08 DIAGNOSIS — H918X1 Other specified hearing loss, right ear: Secondary | ICD-10-CM

## 2022-09-08 DIAGNOSIS — H9191 Unspecified hearing loss, right ear: Secondary | ICD-10-CM | POA: Insufficient documentation

## 2022-09-08 DIAGNOSIS — N1832 Chronic kidney disease, stage 3b: Secondary | ICD-10-CM

## 2022-09-08 DIAGNOSIS — G510 Bell's palsy: Secondary | ICD-10-CM

## 2022-09-08 DIAGNOSIS — E1122 Type 2 diabetes mellitus with diabetic chronic kidney disease: Secondary | ICD-10-CM | POA: Diagnosis not present

## 2022-09-08 DIAGNOSIS — I63239 Cerebral infarction due to unspecified occlusion or stenosis of unspecified carotid arteries: Secondary | ICD-10-CM

## 2022-09-08 DIAGNOSIS — Z23 Encounter for immunization: Secondary | ICD-10-CM | POA: Diagnosis not present

## 2022-09-08 DIAGNOSIS — I700 Atherosclerosis of aorta: Secondary | ICD-10-CM

## 2022-09-08 DIAGNOSIS — E785 Hyperlipidemia, unspecified: Secondary | ICD-10-CM

## 2022-09-08 LAB — POCT GLYCOSYLATED HEMOGLOBIN (HGB A1C): Hemoglobin A1C: 9.8 % — AB (ref 4.0–5.6)

## 2022-09-08 LAB — GLUCOSE, CAPILLARY: Glucose-Capillary: 271 mg/dL — ABNORMAL HIGH (ref 70–99)

## 2022-09-08 MED ORDER — JANUMET 50-1000 MG PO TABS: 1.0000 | ORAL_TABLET | Freq: Two times a day (BID) | ORAL | 1 refills | Status: DC

## 2022-09-08 MED ORDER — DICLOFENAC SODIUM 1 % EX GEL: 4.0000 g | Freq: Four times a day (QID) | CUTANEOUS | 1 refills | Status: DC

## 2022-09-08 NOTE — Assessment & Plan Note (Addendum)
Patient has chronic poorly-controlled diabetes managed with Janumet 50-500mg  twice daily. Patient endorses compliance for several months and states that he recently started using a different pharmacy last month. Per refill history last month is the first med refill recorded, which may be attributed to switching to a new pharmacy. Patient reports he feels well on this regimen and denies bowel movement changes, constipation, diarrhea, or myalgias. He also denies polyuria, polydipsia. Last a1C four months ago was 10.4.  A&P: Hga1C today was slightly improved at 9.8 but still not near goal. Today we discussed the option of switching to a GLP-1 but patient preferred to stay on current regimen at this time, which he is tolerating well. Plan is to titrate up the Janumet and recheck a1C in 3 months to reevaluate.  Plan: -Increase Janumet to 50-500mg  in the AM and 50-1000mg  in the PM for one week, and then increase to 50-1000mg  twice daily.  -Referral to diabetes coordinator for diabetes nutrition education -Check a1C next visit

## 2022-09-08 NOTE — Assessment & Plan Note (Addendum)
Patient presents today with an acute concern of R wrist pain following a fall two weeks ago. Patient reports he felt lightheaded just before his fall while trying to get to the bathroom and fell on his outstretched R hand. He presented to urgent care and had an X-ray done, which was normal to his knowledge. He denies any further episodes of dizziness/lightheadedness or any other falls. He reports that he still has trouble moving his thumb and still has pain on the radial aspect of the wrist.  A&P: Patient continues to have tenderness of the anatomical snuffbox and decreased ROM of thumb, which is concerning for potential scaphoid fracture. Plan to re-image. We also discussed pain management options with Tylenol and Voltaren gel, and recommended against NSAIDS due to CKD.  Plan: -R wrist X-ray -Voltaren Gel

## 2022-09-08 NOTE — Assessment & Plan Note (Addendum)
Patient has chronic hypertension that has improved on Zestoretic 10-12.5mg . Patient endorses compliance. He denies dizziness and lighteheadedness, other than a single brief episode associated with a recent fall.  A&P: Today's BP in office was well-controlled at 127/74. His recent fall does not seem related to HTN med. We recommended that patient stay seated and stay hydrated to prevent dizziness/lightheadedness, and we may decrease dose if this recurs. Plan: -Continue Zestoretic 10-12.5mg 

## 2022-09-08 NOTE — Assessment & Plan Note (Signed)
Patient has chronic stable CKD stage IIIb. Patient's HTN is currently under good control and Hba1C was slightly improved at 9.8 today. Patient has been compliant on medications.   Plan: -Recheck BMP today.

## 2022-09-08 NOTE — Assessment & Plan Note (Signed)
Patient reports he last followed with Vascular surgery last year. Will place referral today for follow-up appointment.

## 2022-09-08 NOTE — Assessment & Plan Note (Signed)
Patient noted to have R-sided hearing loss on neuro exam today. Ear exam was unremarkable for impaction bilaterally. Consider referral to Audiology for further evaluation next visit.

## 2022-09-08 NOTE — Assessment & Plan Note (Signed)
Patient's last lipid profile four months ago showed LDL 188 HDL 39. ASCVD risk 50.9%. Patient endorses compliance with Lipitor 80mg . Med refill records verify a refill last month, although patient endorses compliance for longer.  A&P: It is unclear if patient became more consistent with taking statin recently, or if has been insufficient with lowering his cholesterol. Plan to recheck today and reevaluate based on results.  Plan: -Lipid profile today -Continue Atorvastatin 80mg 

## 2022-09-08 NOTE — Assessment & Plan Note (Signed)
Patient reports resolution of symptoms with respect to eye closure. On exam patient has PERRL, and can fully close eyelids and remain closed against resistance. Patient did not see the ophthalmologist since last visit but reports he has no issues with eyes presently.  Plan: -No further workup at this time.

## 2022-09-08 NOTE — Assessment & Plan Note (Signed)
Flu shot given in office today. Recommended patient get Shingrix at pharmacy, patient agreed with plan.

## 2022-09-08 NOTE — Patient Instructions (Signed)
Thank you for seeing [place] today Mr. Juan [name]! Your blood pressure today was 127/74, which is good. Your hemoglobin A1C today was 9.8 today.   Labwork/Testing We are looking at your cholesterol and kidney function today, and we will call you with the results. We also ordered a hand X-ray.   2. Referrals We put in a referral for the vascular surgeon, and you should hear from their office to set up an appointment. We are also referring you to the diabetes coordinator to talk about diabetes and diet.   3. Medications For the Janumet, please take one 50-500mg  pill in the morning and one 50-1000mg  pill at night for one week. After one week, please take 50-1000mg  twice a day and stop taking the 50-500mg .   We are also prescribing voltaren gel for hand pain. You can also take Tylenol for pain.   4. Health Maintenance You got a flu shot in office today. We also recommend you get a Shingles vaccine at your pharmacy.

## 2022-09-08 NOTE — Progress Notes (Signed)
Subjective:   Patient ID: [name] male   DOB: 17-Apr-1951 71 y.o.   MRN: [id number]  HPI: [name] is a 71 y.o. with the past medical history stated below who presents for an acute concern of hand pain and routine follow-up. Patient is accompanied by his son today. Please see Plan for individualized problem-based charting.   Patient Active Problem List   Diagnosis Date Noted   Wrist pain, acute, right 09/08/2022   Hearing loss, right 09/08/2022   Healthcare maintenance 09/08/2022   Bell's palsy 01/29/2022   Abnormal LFTs 09/26/2020   Aortic atherosclerosis ([place]) 09/12/2020   Pneumonia 09/12/2020   Mobitz type 2 second degree heart block 02/07/2020   Eosinophilia 08/22/2019   Chronic kidney disease (CKD) 01/13/2019   Medication management 09/14/2017   Hypertension 07/27/2013   Dyslipidemia associated with type 2 diabetes mellitus ([place]) 07/27/2013   Carotid artery stenosis with cerebral infarction s/p left endarerectomy  07/20/2013   History of embolic stroke (123456) 0000000   Type 2 diabetes mellitus with peripheral vascular disease ([place]) 07/16/2013     Current Outpatient Medications  Medication Sig Dispense Refill   diclofenac Sodium (VOLTAREN) 1 % GEL Apply 4 g topically 4 (four) times daily. 150 g 1   sitaGLIPtin-metformin (JANUMET) 50-1000 MG tablet Take 1 tablet by mouth 2 (two) times daily with a meal. 90 tablet 1   aspirin 81 MG EC tablet Take 1 tablet (81 mg total) by mouth daily. 90 tablet 3   atorvastatin (LIPITOR) 80 MG tablet Take 1 tablet (80 mg total) by mouth daily. 90 tablet 0   Blood Glucose Monitoring Suppl (FREESTYLE LITE) DEVI Use to check blood sugar up to 7 times a week 1 each 0   glucose blood (FREESTYLE LITE) test strip Check blood sugar up to 7 times a week as instructed 100 each 3   Lancets (FREESTYLE) lancets Check blood sugar up to 7 times a week as instructed 100 each 3   lisinopril-hydrochlorothiazide (ZESTORETIC) 10-12.5 MG tablet  Take 1 tablet by mouth daily. 90 tablet 0   No current facility-administered medications for this visit.     Review of Systems: Review of systems is negative other than what is noted in individual problem-based charting.    Objective:   Physical Exam: Vitals:   09/08/22 1032  BP: 127/74  Pulse: 66  Temp: 98.1 F (36.7 C)  TempSrc: Oral  SpO2: 100%  Weight: 151 lb 14.4 oz (68.9 kg)  Height: 5\' 5"  (1.651 m)   Physical Exam Vitals reviewed.  Constitutional:      General: He is not in acute distress.    Appearance: Normal appearance.  HENT:     Head: Normocephalic.     Right Ear: Tympanic membrane, ear canal and external ear normal. There is no impacted cerumen.     Left Ear: Tympanic membrane, ear canal and external ear normal. There is no impacted cerumen.  Eyes:     Extraocular Movements: Extraocular movements intact.     Conjunctiva/sclera: Conjunctivae normal.     Pupils: Pupils are equal, round, and reactive to light.  Cardiovascular:     Rate and Rhythm: Normal rate and regular rhythm.     Heart sounds: No murmur heard. Pulmonary:     Effort: Pulmonary effort is normal. No respiratory distress.     Breath sounds: Normal breath sounds. No wheezing.  Abdominal:     General: Abdomen is flat. Bowel sounds are normal. There is no  distension.     Palpations: Abdomen is soft.     Tenderness: There is no abdominal tenderness. There is no guarding.  Musculoskeletal:        General: Tenderness present. No swelling.     Comments: Right hand: decreased ROM of thumb. Ecchymosis on palm. No erythema or edema of wrist. Tenderness on palpation of anatomic snuffbox. No scaphoid tubercle tenderness, no CMC joint tenderness.   Skin:    General: Skin is warm and dry.     Findings: No erythema.  Neurological:     Mental Status: He is alert and oriented to person, place, and time.     Cranial Nerves: Cranial nerve deficit and facial asymmetry present.     Comments: Mild L-sided  lower facial asymmetry. Decreased hearing in R side. No opthalmoplegia.       Assessment & Plan:   Type 2 diabetes mellitus with peripheral vascular disease (HCC) Patient has chronic poorly-controlled diabetes managed with Janumet 50-500mg  twice daily. Patient endorses compliance for several months and states that he recently started using a different pharmacy last month. Per refill history last month is the first med refill recorded, which may be attributed to switching to a new pharmacy. Patient reports he feels well on this regimen and denies bowel movement changes, constipation, diarrhea, or myalgias. He also denies polyuria, polydipsia. Last a1C four months ago was 10.4.  A&P: Hga1C today was slightly improved at 9.8 but still not near goal. Today we discussed the option of switching to a GLP-1 but patient preferred to stay on current regimen at this time, which he is tolerating well. Plan is to titrate up the Janumet and recheck a1C in 3 months to reevaluate.  Plan: -Increase Janumet to 50-500mg  in the AM and 50-1000mg  in the PM for one week, and then increase to 50-1000mg  twice daily.  -Referral to diabetes coordinator for diabetes nutrition education -Check a1C next visit   Hypertension Patient has chronic hypertension that has improved on Zestoretic 10-12.5mg . Patient endorses compliance. He denies dizziness and lighteheadedness, other than a single brief episode associated with a recent fall.  A&P: Today's BP in office was well-controlled at 127/74. His recent fall does not seem related to HTN med. We recommended that patient stay seated and stay hydrated to prevent dizziness/lightheadedness, and we may decrease dose if this recurs. Plan: -Continue Zestoretic 10-12.5mg     Chronic kidney disease (CKD) Patient has chronic stable CKD stage IIIb. Patient's HTN is currently under good control and Hba1C was slightly improved at 9.8 today. Patient has been compliant on medications.    Plan: -Recheck BMP today.    Wrist pain, acute, right Patient presents today with an acute concern of R wrist pain following a fall two weeks ago. Patient reports he felt lightheaded just before his fall while trying to get to the bathroom and fell on his outstretched R hand. He presented to urgent care and had an X-ray done, which was normal to his knowledge. He denies any further episodes of dizziness/lightheadedness or any other falls. He reports that he still has trouble moving his thumb and still has pain on the radial aspect of the wrist.  A&P: Patient continues to have tenderness of the anatomical snuffbox and decreased ROM of thumb, which is concerning for potential scaphoid fracture. Plan to re-image. We also discussed pain management options with Tylenol and Voltaren gel, and recommended against NSAIDS due to CKD.  Plan: -R wrist X-ray -Voltaren Gel    Dyslipidemia associated with  type 2 diabetes mellitus (HCC) Patient's last lipid profile four months ago showed LDL 188 HDL 39. ASCVD risk 50.9%. Patient endorses compliance with Lipitor 80mg . Med refill records verify a refill last month, although patient endorses compliance for longer.  A&P: It is unclear if patient became more consistent with taking statin recently, or if has been insufficient with lowering his cholesterol. Plan to recheck today and reevaluate based on results.  Plan: -Lipid profile today -Continue Atorvastatin 80mg   Bell's palsy Patient reports resolution of symptoms with respect to eye closure. On exam patient has PERRL, and can fully close eyelids and remain closed against resistance. Patient did not see the ophthalmologist since last visit but reports he has no issues with eyes presently.  Plan: -No further workup at this time.    Hearing loss, right Patient noted to have R-sided hearing loss on neuro exam today. Ear exam was unremarkable for impaction bilaterally. Consider referral to Audiology for  further evaluation next visit.   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 Will place referral today for follow-up appointment.  Healthcare maintenance Flu shot given in office today. Recommended patient get Shingrix at pharmacy, patient agreed with plan.

## 2022-09-09 LAB — LIPID PANEL
Chol/HDL Ratio: 4.3 ratio (ref 0.0–5.0)
Cholesterol, Total: 155 mg/dL (ref 100–199)
HDL: 36 mg/dL — ABNORMAL LOW (ref >39)
LDL Chol Calc (NIH): 74 mg/dL (ref 0–99)
Triglycerides: 280 mg/dL — ABNORMAL HIGH (ref 0–149)
VLDL Cholesterol Cal: 45 mg/dL — ABNORMAL HIGH (ref 5–40)

## 2022-09-09 LAB — BMP8+ANION GAP
Anion Gap: 20 mmol/L — ABNORMAL HIGH (ref 10.0–18.0)
BUN/Creatinine Ratio: 17 (ref 10–24)
BUN: 26 mg/dL (ref 8–27)
CO2: 21 mmol/L (ref 20–29)
Calcium: 10.2 mg/dL (ref 8.6–10.2)
Chloride: 97 mmol/L (ref 96–106)
Creatinine, Ser: 1.56 mg/dL — ABNORMAL HIGH (ref 0.76–1.27)
Glucose: 233 mg/dL — ABNORMAL HIGH (ref 70–99)
Potassium: 5.1 mmol/L (ref 3.5–5.2)
Sodium: 138 mmol/L (ref 134–144)
eGFR: 47 mL/min/{1.73_m2} — ABNORMAL LOW (ref >59)

## 2022-09-09 NOTE — Assessment & Plan Note (Signed)
-   This problem is chronic and stable -  No CP, no sob. Patient feels well today - Will c/w statin and asa for now - No further work up at this time

## 2022-09-09 NOTE — Addendum Note (Signed)
Addended by: Earl Lagos on: 09/09/2022 11:30 AM   Modules accepted: Orders

## 2022-09-09 NOTE — Progress Notes (Signed)
Attestation for Student Documentation:  I personally was present and performed or re-performed the history, physical exam and medical decision-making activities of this service and have verified that the service and findings are accurately documented in the student's note.  Earl Lagos, MD 09/09/2022, 11:01 AM

## 2022-09-11 ENCOUNTER — Encounter: Payer: Self-pay | Admitting: Orthopaedic Surgery

## 2022-09-11 ENCOUNTER — Ambulatory Visit: Payer: Medicare Other | Admitting: Orthopaedic Surgery

## 2022-09-11 DIAGNOSIS — S62340A Nondisplaced fracture of base of second metacarpal bone, right hand, initial encounter for closed fracture: Secondary | ICD-10-CM | POA: Diagnosis not present

## 2022-09-11 MED ORDER — TRAMADOL HCL 50 MG PO TABS: 50.0000 mg | ORAL_TABLET | Freq: Two times a day (BID) | ORAL | 2 refills | Status: DC | PRN

## 2022-09-11 NOTE — Progress Notes (Unsigned)
Office Visit Note   Patient: Juan [name]           Date of Birth: 05-28-1951           MRN: [id number] Visit Date: 09/11/2022              Requested by: Earl Lagos, MD [address], [address] [place],  [place] [id number] PCP: Earl Lagos, MD   Assessment & Plan: Visit Diagnoses:  1. Closed nondisplaced fracture of base of second metacarpal bone of right hand, initial encounter     Plan: Impression is right hand fracture to the base of the second metacarpal.  Based on the patient's age and low demand, this should be amenable to nonoperative treatment.  We recommended placing him in a Velcro wrist splint but he notes this is too painful.  He will just go without.  Nonweightbearing to the right upper extremity.  He will follow-up with [place] in 4 weeks time for repeat evaluation and x-rays of the right hand.  Call with concerns or questions.  Follow-Up Instructions: Return in about 4 weeks (around 10/09/2022).   Orders:  No orders of the defined types were placed in this encounter.  No orders of the defined types were placed in this encounter.     Procedures: No procedures performed   Clinical Data: No additional findings.   Subjective: Chief Complaint  Patient presents with   Right Wrist - Pain, Injury    HPI patient is a pleasant 71 year old gentleman who comes in today following an injury to his right hand.  Approximately 2 weeks ago, he sustained a fall landing on his right hand.  He was seen by x-rays were obtained and showed a nondisplaced fracture to the base of the second metacarpal.  He has had pain and swelling since.  He has been using an Ace wrap.  He has not been taking anything for pain.  Review of Systems as detailed in HPI.  All others reviewed and are negative.   Objective: Vital Signs: There were no vitals taken for this visit.  Physical Exam well-developed well-nourished gentleman in no acute distress.  Alert and oriented x3.  Ortho Exam  right hand exam shows mild swelling.  He does have some ecchymosis to the volar hand.  Moderate tenderness to the first metacarpal base.  He is unable to make a full composite fist.  He is neurovascular intact distally.  Specialty Comments:  No specialty comments available.  Imaging: No new imaging   PMFS History: Patient Active Problem List   Diagnosis Date Noted   Fracture, metacarpal 09/08/2022   Hearing loss, right 09/08/2022   Healthcare maintenance 09/08/2022   Bell's palsy 01/29/2022   Abnormal LFTs 09/26/2020   Aortic atherosclerosis (HCC) 09/12/2020   Pneumonia 09/12/2020   Mobitz type 2 second degree heart block 02/07/2020   Eosinophilia 08/22/2019   Chronic kidney disease (CKD) 01/13/2019   Medication management 09/14/2017   Hypertension 07/27/2013   Dyslipidemia associated with type 2 diabetes mellitus (HCC) 07/27/2013   Carotid artery stenosis with cerebral infarction s/p left endarerectomy  07/20/2013   History of embolic stroke (05/7123) 07/16/2013   Type 2 diabetes mellitus with peripheral vascular disease (HCC) 07/16/2013   Past Medical History:  Diagnosis Date   Carotid artery occlusion    Diabetes mellitus without complication (HCC)    Type II, takes metformin per patient(11/01/15)   Hyperlipemia    Hypertension    Stroke (HCC) 06/2013   Left side- weak,  unable to make a fist    No family history on file.  Past Surgical History:  Procedure Laterality Date   CAROTID ENDARTERECTOMY Left 08-04-13   ENDARTERECTOMY Left 08/04/2013   Procedure: Left Carotid Endarterectomy with hemashield patch angioplasty;  Surgeon: Larina Earthly, MD;  Location: [facility] OR;  Service: Vascular;  Laterality: Left;   NECK SURGERY  2003   Cord Compression   PACEMAKER IMPLANT N/A 02/07/2020   Procedure: PACEMAKER IMPLANT;  Surgeon: Marinus Maw, MD;  Location: [facility] INVASIVE CV LAB;  Service: Cardiovascular;  Laterality: N/A;   TEE WITHOUT CARDIOVERSION N/A 07/20/2013   Procedure:  TRANSESOPHAGEAL ECHOCARDIOGRAM (TEE);  Surgeon: Wendall Stade, MD;  Location: [facility] ENDOSCOPY;  Service: Cardiovascular;  Laterality: N/A;   Social History   Occupational History   Occupation: Retired  Tobacco Use   Smoking status: Former    Years: 50.00    Types: Cigarettes    Quit date: 12/28/2005    Years since quitting: 16.7   Smokeless tobacco: Former  Substance and Sexual Activity   Alcohol use: No    Alcohol/week: 0.0 standard drinks of alcohol    Comment: a few drinks occasionally    Drug use: No   Sexual activity: Not on file

## 2022-10-08 ENCOUNTER — Ambulatory Visit: Payer: Medicare Other | Admitting: Orthopaedic Surgery

## 2022-10-08 ENCOUNTER — Ambulatory Visit (INDEPENDENT_AMBULATORY_CARE_PROVIDER_SITE_OTHER): Payer: Medicare Other

## 2022-10-08 DIAGNOSIS — S62340A Nondisplaced fracture of base of second metacarpal bone, right hand, initial encounter for closed fracture: Secondary | ICD-10-CM

## 2022-10-08 NOTE — Progress Notes (Signed)
Office Visit Note   Patient: Juan [name]           Date of Birth: 1951/11/01           MRN: [id number] Visit Date: 10/08/2022              Requested by: Earl Lagos, MD [address] [place],  [place] [address] PCP: Earl Lagos, MD   Assessment & Plan: Visit Diagnoses:  1. Closed nondisplaced fracture of base of second metacarpal bone of right hand, initial encounter     Plan: Impression is 6 weeks status post right hand second metacarpal base fracture.  He is clinically and radiographically doing well.  I do not believe he needs any splinting or physical therapy at this time.  He will continue to advance with activity as tolerated.  Follow-up as needed.  Follow-Up Instructions: Return if symptoms worsen or fail to improve.   Orders:  Orders Placed This Encounter  Procedures   XR Hand Complete Right   No orders of the defined types were placed in this encounter.     Procedures: No procedures performed   Clinical Data: No additional findings.   Subjective: Chief Complaint  Patient presents with   Right Hand - Pain    HPI patient is a pleasant 71 year old gentleman who comes in today with an interpreter.  He is approximately 6 weeks status post second metacarpal base fracture.  He has been doing much better.  He denies any pain.  He has been wrapping his wrist in an Ace bandage.       Objective: Vital Signs: There were no vitals taken for this visit.    Ortho Exam right hand exam shows no swelling.  No ecchymosis.  No tenderness to the fracture site.  He does have limited range of motion secondary to right-sided hemiparalysis but is back to baseline.  He is neurovascular intact distally.  Specialty Comments:  No specialty comments available.  Imaging: XR Hand Complete Right  Result Date: 10/08/2022 X-rays demonstrate consolidation to the fracture site    PMFS History: Patient Active Problem List   Diagnosis Date Noted    Fracture, metacarpal 09/08/2022   Hearing loss, right 09/08/2022   Healthcare maintenance 09/08/2022   Bell's palsy 01/29/2022   Abnormal LFTs 09/26/2020   Aortic atherosclerosis (HCC) 09/12/2020   Pneumonia 09/12/2020   Mobitz type 2 second degree heart block 02/07/2020   Eosinophilia 08/22/2019   Chronic kidney disease (CKD) 01/13/2019   Medication management 09/14/2017   Hypertension 07/27/2013   Dyslipidemia associated with type 2 diabetes mellitus (HCC) 07/27/2013   Carotid artery stenosis with cerebral infarction s/p left endarerectomy  07/20/2013   History of embolic stroke (01/5365) 07/16/2013   Type 2 diabetes mellitus with peripheral vascular disease (HCC) 07/16/2013   Past Medical History:  Diagnosis Date   Carotid artery occlusion    Diabetes mellitus without complication (HCC)    Type II, takes metformin per patient(11/01/15)   Hyperlipemia    Hypertension    Stroke (HCC) 06/2013   Left side- weak, unable to make a fist    No family history on file.  Past Surgical History:  Procedure Laterality Date   CAROTID ENDARTERECTOMY Left 08-04-13   ENDARTERECTOMY Left 08/04/2013   Procedure: Left Carotid Endarterectomy with hemashield patch angioplasty;  Surgeon: Larina Earthly, MD;  Location: [facility];  Service: Vascular;  Laterality: Left;   NECK SURGERY  2003   Cord Compression   PACEMAKER IMPLANT  N/A 02/07/2020   Procedure: PACEMAKER IMPLANT;  Surgeon: Evans Lance, MD;  Location: [place] [facility];  Service: Cardiovascular;  Laterality: N/A;   TEE WITHOUT CARDIOVERSION N/A 07/20/2013   Procedure: TRANSESOPHAGEAL ECHOCARDIOGRAM (TEE);  Surgeon: Josue Hector, MD;  Location: [facility] ENDOSCOPY;  Service: Cardiovascular;  Laterality: N/A;   Social History   Occupational History   Occupation: Retired  Tobacco Use   Smoking status: Former    Years: 50.00    Types: Cigarettes    Quit date: 12/28/2005    Years since quitting: 16.7   Smokeless tobacco: Former  Substance and Sexual  Activity   Alcohol use: No    Alcohol/week: 0.0 standard drinks of alcohol    Comment: a few drinks occasionally    Drug use: No   Sexual activity: Not on file

## 2022-11-06 ENCOUNTER — Ambulatory Visit (INDEPENDENT_AMBULATORY_CARE_PROVIDER_SITE_OTHER): Payer: Medicare Other

## 2022-11-06 DIAGNOSIS — Z950 Presence of cardiac pacemaker: Secondary | ICD-10-CM | POA: Diagnosis not present

## 2022-11-06 DIAGNOSIS — I441 Atrioventricular block, second degree: Secondary | ICD-10-CM | POA: Diagnosis not present

## 2022-11-11 LAB — CUP PACEART REMOTE DEVICE CHECK
Battery Remaining Longevity: 100 mo
Battery Voltage: 2.99 V
Brady Statistic AP VP Percent: 3.01 %
Brady Statistic AP VS Percent: 0.02 %
Brady Statistic AS VP Percent: 96.51 %
Brady Statistic AS VS Percent: 0.46 %
Brady Statistic RA Percent Paced: 3.1 %
Brady Statistic RV Percent Paced: 99.52 %
Date Time Interrogation Session: 20231110024400
Implantable Lead Connection Status: 753985
Implantable Lead Connection Status: 753985
Implantable Lead Implant Date: 20210210
Implantable Lead Implant Date: 20210210
Implantable Lead Location: 753859
Implantable Lead Location: 753860
Implantable Lead Model: 3830
Implantable Lead Model: 5076
Implantable Pulse Generator Implant Date: 20210210
Lead Channel Impedance Value: 304 Ohm
Lead Channel Impedance Value: 380 Ohm
Lead Channel Impedance Value: 380 Ohm
Lead Channel Impedance Value: 570 Ohm
Lead Channel Pacing Threshold Amplitude: 0.5 V
Lead Channel Pacing Threshold Amplitude: 0.625 V
Lead Channel Pacing Threshold Pulse Width: 0.4 ms
Lead Channel Pacing Threshold Pulse Width: 0.4 ms
Lead Channel Sensing Intrinsic Amplitude: 31.625 mV
Lead Channel Sensing Intrinsic Amplitude: 31.625 mV
Lead Channel Sensing Intrinsic Amplitude: 5.25 mV
Lead Channel Sensing Intrinsic Amplitude: 5.25 mV
Lead Channel Setting Pacing Amplitude: 1.5 V
Lead Channel Setting Pacing Amplitude: 2.5 V
Lead Channel Setting Pacing Pulse Width: 0.4 ms
Lead Channel Setting Sensing Sensitivity: 2.8 mV
Zone Setting Status: 755011
Zone Setting Status: 755011

## 2022-11-11 NOTE — Progress Notes (Signed)
Remote pacemaker transmission.   

## 2022-12-07 ENCOUNTER — Encounter: Payer: Self-pay | Admitting: Internal Medicine

## 2022-12-08 ENCOUNTER — Encounter: Payer: Medicare Other | Admitting: Dietician

## 2022-12-08 ENCOUNTER — Encounter: Payer: Medicare Other | Admitting: Internal Medicine

## 2022-12-16 ENCOUNTER — Encounter: Payer: Self-pay | Admitting: *Deleted

## 2023-02-05 ENCOUNTER — Ambulatory Visit: Payer: Medicare Other

## 2023-02-05 DIAGNOSIS — I441 Atrioventricular block, second degree: Secondary | ICD-10-CM | POA: Diagnosis not present

## 2023-02-05 LAB — CUP PACEART REMOTE DEVICE CHECK
Battery Remaining Longevity: 94 mo
Battery Voltage: 2.99 V
Brady Statistic AP VP Percent: 8.02 %
Brady Statistic AP VS Percent: 0.04 %
Brady Statistic AS VP Percent: 91.46 %
Brady Statistic AS VS Percent: 0.49 %
Brady Statistic RA Percent Paced: 8.18 %
Brady Statistic RV Percent Paced: 99.48 %
Date Time Interrogation Session: 20240209040226
Implantable Lead Connection Status: 753985
Implantable Lead Connection Status: 753985
Implantable Lead Implant Date: 20210210
Implantable Lead Implant Date: 20210210
Implantable Lead Location: 753859
Implantable Lead Location: 753860
Implantable Lead Model: 3830
Implantable Lead Model: 5076
Implantable Pulse Generator Implant Date: 20210210
Lead Channel Impedance Value: 304 Ohm
Lead Channel Impedance Value: 342 Ohm
Lead Channel Impedance Value: 380 Ohm
Lead Channel Impedance Value: 551 Ohm
Lead Channel Pacing Threshold Amplitude: 0.625 V
Lead Channel Pacing Threshold Amplitude: 0.625 V
Lead Channel Pacing Threshold Pulse Width: 0.4 ms
Lead Channel Pacing Threshold Pulse Width: 0.4 ms
Lead Channel Sensing Intrinsic Amplitude: 31.625 mV
Lead Channel Sensing Intrinsic Amplitude: 31.625 mV
Lead Channel Sensing Intrinsic Amplitude: 5.5 mV
Lead Channel Sensing Intrinsic Amplitude: 5.5 mV
Lead Channel Setting Pacing Amplitude: 1.5 V
Lead Channel Setting Pacing Amplitude: 2.5 V
Lead Channel Setting Pacing Pulse Width: 0.4 ms
Lead Channel Setting Sensing Sensitivity: 2.8 mV
Zone Setting Status: 755011
Zone Setting Status: 755011

## 2023-02-23 NOTE — Progress Notes (Signed)
Remote pacemaker transmission.   

## 2023-05-07 ENCOUNTER — Ambulatory Visit (INDEPENDENT_AMBULATORY_CARE_PROVIDER_SITE_OTHER): Payer: Medicare Other

## 2023-05-07 DIAGNOSIS — I441 Atrioventricular block, second degree: Secondary | ICD-10-CM | POA: Diagnosis not present

## 2023-05-07 LAB — CUP PACEART REMOTE DEVICE CHECK
Battery Remaining Longevity: 94 mo
Battery Voltage: 2.99 V
Brady Statistic AP VP Percent: 8.41 %
Brady Statistic AP VS Percent: 0.05 %
Brady Statistic AS VP Percent: 90.5 %
Brady Statistic AS VS Percent: 1.04 %
Brady Statistic RA Percent Paced: 8.72 %
Brady Statistic RV Percent Paced: 98.91 %
Date Time Interrogation Session: 20240510045256
Implantable Lead Connection Status: 753985
Implantable Lead Connection Status: 753985
Implantable Lead Implant Date: 20210210
Implantable Lead Implant Date: 20210210
Implantable Lead Location: 753859
Implantable Lead Location: 753860
Implantable Lead Model: 3830
Implantable Lead Model: 5076
Implantable Pulse Generator Implant Date: 20210210
Lead Channel Impedance Value: 323 Ohm
Lead Channel Impedance Value: 380 Ohm
Lead Channel Impedance Value: 399 Ohm
Lead Channel Impedance Value: 589 Ohm
Lead Channel Pacing Threshold Amplitude: 0.625 V
Lead Channel Pacing Threshold Amplitude: 0.625 V
Lead Channel Pacing Threshold Pulse Width: 0.4 ms
Lead Channel Pacing Threshold Pulse Width: 0.4 ms
Lead Channel Sensing Intrinsic Amplitude: 31.625 mV
Lead Channel Sensing Intrinsic Amplitude: 31.625 mV
Lead Channel Sensing Intrinsic Amplitude: 6.375 mV
Lead Channel Sensing Intrinsic Amplitude: 6.375 mV
Lead Channel Setting Pacing Amplitude: 1.5 V
Lead Channel Setting Pacing Amplitude: 2.5 V
Lead Channel Setting Pacing Pulse Width: 0.4 ms
Lead Channel Setting Sensing Sensitivity: 2.8 mV
Zone Setting Status: 755011
Zone Setting Status: 755011

## 2023-05-25 NOTE — Progress Notes (Signed)
Remote pacemaker transmission.   

## 2023-08-06 ENCOUNTER — Ambulatory Visit: Payer: PPO

## 2023-08-06 DIAGNOSIS — I441 Atrioventricular block, second degree: Secondary | ICD-10-CM | POA: Diagnosis not present

## 2023-08-20 NOTE — Progress Notes (Signed)
Remote pacemaker transmission.   

## 2023-10-06 ENCOUNTER — Encounter: Payer: Self-pay | Admitting: Student

## 2023-10-06 ENCOUNTER — Ambulatory Visit: Payer: Medicare Other

## 2023-10-06 ENCOUNTER — Ambulatory Visit (INDEPENDENT_AMBULATORY_CARE_PROVIDER_SITE_OTHER): Payer: Medicare Other | Admitting: Student

## 2023-10-06 VITALS — BP 169/79 | HR 66 | Temp 97.9°F | Ht 65.0 in | Wt 154.4 lb

## 2023-10-06 VITALS — BP 169/79 | HR 66 | Temp 97.9°F | Resp 28 | Ht 65.0 in | Wt 154.9 lb

## 2023-10-06 DIAGNOSIS — I10 Essential (primary) hypertension: Secondary | ICD-10-CM | POA: Diagnosis not present

## 2023-10-06 DIAGNOSIS — N1832 Chronic kidney disease, stage 3b: Secondary | ICD-10-CM

## 2023-10-06 DIAGNOSIS — E1151 Type 2 diabetes mellitus with diabetic peripheral angiopathy without gangrene: Secondary | ICD-10-CM | POA: Diagnosis not present

## 2023-10-06 DIAGNOSIS — Z7984 Long term (current) use of oral hypoglycemic drugs: Secondary | ICD-10-CM

## 2023-10-06 DIAGNOSIS — Z0000 Encounter for general adult medical examination without abnormal findings: Secondary | ICD-10-CM | POA: Diagnosis not present

## 2023-10-06 DIAGNOSIS — Z87891 Personal history of nicotine dependence: Secondary | ICD-10-CM | POA: Diagnosis not present

## 2023-10-06 DIAGNOSIS — E785 Hyperlipidemia, unspecified: Secondary | ICD-10-CM | POA: Diagnosis not present

## 2023-10-06 DIAGNOSIS — E1169 Type 2 diabetes mellitus with other specified complication: Secondary | ICD-10-CM

## 2023-10-06 LAB — POCT GLYCOSYLATED HEMOGLOBIN (HGB A1C): Hemoglobin A1C: 10 % — AB (ref 4.0–5.6)

## 2023-10-06 LAB — GLUCOSE, CAPILLARY: Glucose-Capillary: 237 mg/dL — ABNORMAL HIGH (ref 70–99)

## 2023-10-06 MED ORDER — FREESTYLE LANCETS MISC: 3 refills | Status: AC

## 2023-10-06 MED ORDER — LISINOPRIL-HYDROCHLOROTHIAZIDE 10-12.5 MG PO TABS
1.0000 | ORAL_TABLET | Freq: Every day | ORAL | 3 refills | Status: DC
Start: 2023-10-06 — End: 2024-09-08

## 2023-10-06 MED ORDER — ASPIRIN 81 MG PO TBEC: 81.0000 mg | DELAYED_RELEASE_TABLET | Freq: Every day | ORAL | 3 refills | Status: AC

## 2023-10-06 MED ORDER — ATORVASTATIN CALCIUM 80 MG PO TABS: 80.0000 mg | ORAL_TABLET | Freq: Every day | ORAL | 3 refills | Status: AC

## 2023-10-06 MED ORDER — FREESTYLE LITE TEST VI STRP: ORAL_STRIP | 3 refills | Status: AC

## 2023-10-06 MED ORDER — JANUMET 50-1000 MG PO TABS: 1.0000 | ORAL_TABLET | Freq: Two times a day (BID) | ORAL | 3 refills | Status: AC

## 2023-10-06 MED ORDER — EMPAGLIFLOZIN 10 MG PO TABS
10.0000 mg | ORAL_TABLET | Freq: Every day | ORAL | 3 refills | Status: DC
Start: 2023-10-06 — End: 2024-02-14

## 2023-10-06 MED ORDER — FREESTYLE LITE DEVI: 0 refills | Status: AC

## 2023-10-06 NOTE — Patient Instructions (Signed)
Health Maintenance, Male Adopting a healthy lifestyle and getting preventive care are important in promoting health and wellness. Ask your health care provider about: The right schedule for you to have regular tests and exams. Things you can do on your own to prevent diseases and keep yourself healthy. What should I know about diet, weight, and exercise? Eat a healthy diet  Eat a diet that includes plenty of vegetables, fruits, low-fat dairy products, and lean protein. Do not eat a lot of foods that are high in solid fats, added sugars, or sodium. Maintain a healthy weight Body mass index (BMI) is a measurement that can be used to identify possible weight problems. It estimates body fat based on height and weight. Your health care provider can help determine your BMI and help you achieve or maintain a healthy weight. Get regular exercise Get regular exercise. This is one of the most important things you can do for your health. Most adults should: Exercise for at least 150 minutes each week. The exercise should increase your heart rate and make you sweat (moderate-intensity exercise). Do strengthening exercises at least twice a week. This is in addition to the moderate-intensity exercise. Spend less time sitting. Even light physical activity can be beneficial. Watch cholesterol and blood lipids Have your blood tested for lipids and cholesterol at 73 years of age, then have this test every 5 years. You may need to have your cholesterol levels checked more often if: Your lipid or cholesterol levels are high. You are older than 73 years of age. You are at high risk for heart disease. What should I know about cancer screening? Many types of cancers can be detected early and may often be prevented. Depending on your health history and family history, you may need to have cancer screening at various ages. This may include screening for: Colorectal cancer. Prostate cancer. Skin cancer. Lung  cancer. What should I know about heart disease, diabetes, and high blood pressure? Blood pressure and heart disease High blood pressure causes heart disease and increases the risk of stroke. This is more likely to develop in people who have high blood pressure readings or are overweight. Talk with your health care provider about your target blood pressure readings. Have your blood pressure checked: Every 3-5 years if you are 18-39 years of age. Every year if you are 40 years old or older. If you are between the ages of 65 and 75 and are a current or former smoker, ask your health care provider if you should have a one-time screening for abdominal aortic aneurysm (AAA). Diabetes Have regular diabetes screenings. This checks your fasting blood sugar level. Have the screening done: Once every three years after age 45 if you are at a normal weight and have a low risk for diabetes. More often and at a younger age if you are overweight or have a high risk for diabetes. What should I know about preventing infection? Hepatitis B If you have a higher risk for hepatitis B, you should be screened for this virus. Talk with your health care provider to find out if you are at risk for hepatitis B infection. Hepatitis C Blood testing is recommended for: Everyone born from 1945 through 1965. Anyone with known risk factors for hepatitis C. Sexually transmitted infections (STIs) You should be screened each year for STIs, including gonorrhea and chlamydia, if: You are sexually active and are younger than 73 years of age. You are older than 73 years of age and your   health care provider tells you that you are at risk for this type of infection. Your sexual activity has changed since you were last screened, and you are at increased risk for chlamydia or gonorrhea. Ask your health care provider if you are at risk. Ask your health care provider about whether you are at high risk for HIV. Your health care provider  may recommend a prescription medicine to help prevent HIV infection. If you choose to take medicine to prevent HIV, you should first get tested for HIV. You should then be tested every 3 months for as long as you are taking the medicine. Follow these instructions at home: Alcohol use Do not drink alcohol if your health care provider tells you not to drink. If you drink alcohol: Limit how much you have to 0-2 drinks a day. Know how much alcohol is in your drink. In the [place] one drink equals one 12 oz bottle of beer (355 mL), one 5 oz glass of wine (148 mL), or one 1 oz glass of hard liquor (44 mL). Lifestyle Do not use any products that contain nicotine or tobacco. These products include cigarettes, chewing tobacco, and vaping devices, such as e-cigarettes. If you need help quitting, ask your health care provider. Do not use street drugs. Do not share needles. Ask your health care provider for help if you need support or information about quitting drugs. General instructions Schedule regular health, dental, and eye exams. Stay current with your vaccines. Tell your health care provider if: You often feel depressed. You have ever been abused or do not feel safe at home. Summary Adopting a healthy lifestyle and getting preventive care are important in promoting health and wellness. Follow your health care provider's instructions about healthy diet, exercising, and getting tested or screened for diseases. Follow your health care provider's instructions on monitoring your cholesterol and blood pressure. This information is not intended to replace advice given to you by your health care provider. Make sure you discuss any questions you have with your health care provider. Document Revised: 05/05/2021 Document Reviewed: 05/05/2021 Elsevier Patient Education  2024 [employer] Inc.  

## 2023-10-06 NOTE — Progress Notes (Unsigned)
CC: Routine Follow Up   HPI:  Juan [name] is a 72 y.o. male with pertinent PMH of T2DM, HTN, carotid artery stenosis s/p endarterectomy, and CKD stage IIIa who presents to the clinic for follow-up after being seen 09/08/2022. Please see assessment and plan below for further details.  Past Medical History:  Diagnosis Date   Carotid artery occlusion    Chronic kidney disease (CKD) 01/13/2019   Diabetes mellitus without complication (HCC)    Type II, takes metformin per patient(11/01/15)   Hyperlipemia    Hypertension    Pneumonia 09/12/2020   Stroke (HCC) 06/27/2013   Left side- weak, unable to make a fist    Current Outpatient Medications  Medication Instructions   aspirin EC 81 mg, Oral, Daily   atorvastatin (LIPITOR) 80 mg, Oral, Daily   Blood Glucose Monitoring Suppl (FREESTYLE LITE) DEVI Use to check blood sugar up to 7 times a week   empagliflozin (JARDIANCE) 10 mg, Oral, Daily before breakfast   glucose blood (FREESTYLE LITE) test strip Check blood sugar up to 7 times a week as instructed   Lancets (FREESTYLE) lancets Check blood sugar up to 7 times a week as instructed   lisinopril-hydrochlorothiazide (ZESTORETIC) 10-12.5 MG tablet 1 tablet, Oral, Daily   sitaGLIPtin-metformin (JANUMET) 50-1000 MG tablet 1 tablet, Oral, 2 times daily with meals     Review of Systems:   Pertinent items noted in HPI and/or A&P.  Physical Exam:  Vitals:   10/06/23 0958 10/06/23 1020  BP: (!) 181/86 (!) 169/79  Pulse: 70 66  Resp: (!) 28   Temp: 97.9 F (36.6 C)   TempSrc: Oral   SpO2: 100%   Weight: 154 lb 14.4 oz (70.3 kg)   Height: 5\' 5"  (1.651 m)     Constitutional: Well-appearing elderly male. In no acute distress. HEENT: Normocephalic, atraumatic, Sclera non-icteric, PERRL, EOM intact Cardio:Regular rate and rhythm. 2+ bilateral radial and dorsalis pedis  pulses. Pulm:Clear to auscultation bilaterally. Normal work of breathing on room air. Abdomen: Soft, non-tender,  non-distended, positive bowel sounds. ZOX:WRUEAVWU for extremity edema. Skin:Warm and dry. Neuro:Alert and oriented x3. No focal deficit noted. Psych:Pleasant mood and affect.   Assessment & Plan:   Type 2 diabetes mellitus with peripheral vascular disease (HCC) A1c today 10.0 which is up from 9.8 a year ago.  Based on pharmacy fill information he has rarely had his medication with his last fill of Janumet in 10/2022.  However he tells me that he has been taking this medication every day for the past 3 months without missing any doses until today due to running out of the medication.  This is confirmed through an interpreter.  He does not check his blood sugar at home.  We will plan to resume his Janumet and add Jardiance and then reevaluate in 2 to 3 months.  Patient will bring his medications with him to the next appointment as well as his glucometer. - Continue Janumet 50-1000 mg twice daily and start Jardiance 10 mg daily - Urine microalbumin today  Hypertension With pressure 169/79 on recheck.  Currently prescribed lisinopril-hydrochlorothiazide 10-12.5 mg daily and reports that he has been taking this every day for the past 3 months up until he ran out of it yesterday.  He does take his blood pressure at home and states it can be 140-180 systolic but when he takes his medication it can be 120 systolic.  Denies any postural dizziness. - Continue lisinopril-hydrochlorothiazide 10-12.5 mg daily - Return in 1-2  weeks for nurse BP check and bring blood pressure log to next visit - BMP today    Patient discussed with Dr. Kris Mouton, DO [facility] Internal [facility] Resident PGY-2 Clinic Phone: [phone] Pager: [phone]

## 2023-10-06 NOTE — Patient Instructions (Addendum)
  Thank you, Mr.Colan Mooneyhan, for allowing [place] to provide your care today.  I am going to refill your medications today and would like to add another medication called Jardiance to help improve your blood sugar control.  This is a once daily medication and if you have any pain with urination, itching around your penis or groin, or redness in this area please let us know as this could be a side effect of the medication.  I will also refill your blood sugar checking supplies and I would like you to check your blood sugar at least once a day in the morning.  We will see you back in 2-3 months to recheck your average blood sugar and talk about changing or increasing any medications.  Please also continue to take your blood pressure at home and write down these values to bring with you to your next visit.  We will have you come back for a blood pressure visit and I want a make sure that you take your blood pressure medication (lisinopril-hydrochlorothiazide) before this visit in 1-2 weeks.   I have ordered the following labs for you:   Lab Orders         BMP8+Anion Gap         Lipid Profile         Microalbumin / Creatinine Urine Ratio         Glucose, capillary         POC Hbg A1C       Follow up: 2-3 months    Remember:     Should you have any questions or concerns please call the internal medicine clinic at [phone].     Rocky Morel, DO [facility]

## 2023-10-06 NOTE — Progress Notes (Signed)
Subjective:   Juan [name] is a 72 y.o. male who presents for an Initial Medicare Annual Wellness Visit.  Visit Complete: In person    Cardiac Risk Factors include: advanced age (>64men, >39 women);diabetes mellitus;male gender;hypertension     Objective:    Today's Vitals   10/06/23 1041  BP: (!) 169/79  Pulse: 66  Temp: 97.9 F (36.6 C)  TempSrc: Oral  SpO2: 100%  Weight: 154 lb 6.4 oz (70 kg)  Height: 5\' 5"  (1.651 m)  PainSc: 0-No pain   Body mass index is 25.69 kg/m.     10/06/2023   10:46 AM 10/06/2023    9:57 AM 01/29/2022   12:03 PM 01/14/2022    2:47 PM 09/12/2020    2:37 PM 02/12/2020    3:57 PM 02/07/2020    4:00 AM  Advanced Directives  Does Patient Have a Medical Advance Directive? No No No No No No No  Would patient like information on creating a medical advance directive? No - Patient declined No - Patient declined No - Patient declined  No - Patient declined Yes (MAU/Ambulatory/Procedural Areas - Information given) No - Patient declined    Current Medications (verified) Outpatient Encounter Medications as of 10/06/2023  Medication Sig   aspirin 81 MG EC tablet Take 1 tablet (81 mg total) by mouth daily.   atorvastatin (LIPITOR) 80 MG tablet Take 1 tablet (80 mg total) by mouth daily.   Blood Glucose Monitoring Suppl (FREESTYLE LITE) DEVI Use to check blood sugar up to 7 times a week   diclofenac Sodium (VOLTAREN) 1 % GEL Apply 4 g topically 4 (four) times daily.   glucose blood (FREESTYLE LITE) test strip Check blood sugar up to 7 times a week as instructed   Lancets (FREESTYLE) lancets Check blood sugar up to 7 times a week as instructed   lisinopril-hydrochlorothiazide (ZESTORETIC) 10-12.5 MG tablet Take 1 tablet by mouth daily.   sitaGLIPtin-metformin (JANUMET) 50-1000 MG tablet Take 1 tablet by mouth 2 (two) times daily with a meal.   traMADol (ULTRAM) 50 MG tablet Take 1 tablet (50 mg total) by mouth every 12 (twelve) hours as needed.   No  facility-administered encounter medications on file as of 10/06/2023.    Allergies (verified) Patient has no known allergies.   History: Past Medical History:  Diagnosis Date   Carotid artery occlusion    Chronic kidney disease (CKD) 01/13/2019   Diabetes mellitus without complication (HCC)    Type II, takes metformin per patient(11/01/15)   Hyperlipemia    Hypertension    Pneumonia 09/12/2020   Stroke (HCC) 06/27/2013   Left side- weak, unable to make a fist   Past Surgical History:  Procedure Laterality Date   CAROTID ENDARTERECTOMY Left 08-04-13   ENDARTERECTOMY Left 08/04/2013   Procedure: Left Carotid Endarterectomy with hemashield patch angioplasty;  Surgeon: Larina Earthly, MD;  Location: [facility] OR;  Service: Vascular;  Laterality: Left;   NECK SURGERY  2003   Cord Compression   PACEMAKER IMPLANT N/A 02/07/2020   Procedure: PACEMAKER IMPLANT;  Surgeon: Marinus Maw, MD;  Location: [facility] INVASIVE CV LAB;  Service: Cardiovascular;  Laterality: N/A;   TEE WITHOUT CARDIOVERSION N/A 07/20/2013   Procedure: TRANSESOPHAGEAL ECHOCARDIOGRAM (TEE);  Surgeon: Wendall Stade, MD;  Location: [facility] ENDOSCOPY;  Service: Cardiovascular;  Laterality: N/A;   History reviewed. No pertinent family history. Social History   Socioeconomic History   Marital status: Married    Spouse name: Not on file   Number  of children: 4   Years of education: 0   Highest education level: 6th grade  Occupational History   Occupation: Retired  Tobacco Use   Smoking status: Former    Current packs/day: 0.00    Types: Cigarettes    Start date: 12/29/1955    Quit date: 12/28/2005    Years since quitting: 17.7   Smokeless tobacco: Former  Substance and Sexual Activity   Alcohol use: No    Alcohol/week: 0.0 standard drinks of alcohol    Comment: a few drinks occasionally    Drug use: No   Sexual activity: Not on file  Other Topics Concern   Not on file  Social History Narrative   Lives at home with wife, son, and  grandchildren.   Social Determinants of Health   Financial Resource Strain: Low Risk  (10/06/2023)   Overall Financial Resource Strain (CARDIA)    Difficulty of Paying Living Expenses: Not hard at all  Food Insecurity: No Food Insecurity (10/06/2023)   Hunger Vital Sign    Worried About Running Out of Food in the Last Year: Never true    Ran Out of Food in the Last Year: Never true  Transportation Needs: No Transportation Needs (10/06/2023)   PRAPARE - Administrator, Civil Service (Medical): No    Lack of Transportation (Non-Medical): No  Physical Activity: Insufficiently Active (10/06/2023)   Exercise Vital Sign    Days of Exercise per Week: 2 days    Minutes of Exercise per Session: 30 min  Stress: No Stress Concern Present (10/06/2023)   [employer] of Occupational Health - Occupational Stress Questionnaire    Feeling of Stress : Not at all  Social Connections: Moderately Isolated (10/06/2023)   Social Connection and Isolation Panel [NHANES]    Frequency of Communication with Friends and Family: Once a week    Frequency of Social Gatherings with Friends and Family: Once a week    Attends Religious Services: More than 4 times per year    Active Member of [employer] or Organizations: No    Attends Engineer, structural: Never    Marital Status: Married    Tobacco Counseling Counseling given: Not Answered   Clinical Intake:  Pre-visit preparation completed: Yes  Pain : No/denies pain Pain Score: 0-No pain     BMI - recorded: 25 Nutritional Status: BMI 25 -29 Overweight Nutritional Risks: None Diabetes: Yes CBG done?: Yes CBG resulted in Enter/ Edit results?: Yes Did pt. bring in CBG monitor from home?: No  How often do you need to have someone help you when you read instructions, pamphlets, or other written materials from your doctor or pharmacy?: 3 - Sometimes What is the last grade level you completed in school?: 6 GRADE  Interpreter Needed?:  Yes Interpreter Agency: [facility] Interpreter Name: Juan [name] Patient Declined Interpreter : No Patient signed [place] waiver: No  Information entered by :: [facility] Talia Hoheisel   Activities of Daily Living    10/06/2023   10:43 AM 10/06/2023    9:55 AM  In your present state of health, do you have any difficulty performing the following activities:  Hearing? 0 0  Vision? 0 0  Difficulty concentrating or making decisions? 0 0  Walking or climbing stairs? 1 1  Comment  pain somettimes  Dressing or bathing? 0 0  Doing errands, shopping? 1 1  Comment  family  Quarry manager and eating ? Y   Using the Toilet? N   In the  past six months, have you accidently leaked urine? N   Do you have problems with loss of bowel control? N   Managing your Medications? N   Managing your Finances? N   Housekeeping or managing your Housekeeping? Y     Patient Care Team: Gust Rung, DO as PCP - General (Internal Medicine)  Indicate any recent Medical Services you may have received from other than [facility] providers in the past year (date may be approximate).     Assessment:   This is a routine wellness examination for Juan [name].  Hearing/Vision screen No results found.   Goals Addressed   None   Depression Screen    10/06/2023   10:05 AM 09/08/2022   10:58 AM 05/12/2022   10:09 AM 01/29/2022   12:04 PM 09/26/2020    2:27 PM 09/12/2020    2:37 PM 02/12/2020    3:57 PM  PHQ 2/9 Scores  PHQ - 2 Score 0 0 0 0 0 0 0  PHQ- 9 Score    2       Fall Risk    10/06/2023   10:46 AM 10/06/2023    9:55 AM 09/08/2022   10:56 AM 05/12/2022   10:09 AM 01/29/2022   12:04 PM  Fall Risk   Falls in the past year? 0 0 0 0 0  Comment   1 fall this year 2023    Number falls in past yr: 0 0 0 0   Injury with Fall? 0 0 1 0   Risk for fall due to : Impaired balance/gait History of fall(s)  Impaired balance/gait Impaired balance/gait  Follow up Falls evaluation completed;Falls prevention discussed Falls evaluation  completed;Falls prevention discussed Falls evaluation completed Falls evaluation completed;Falls prevention discussed     MEDICARE RISK AT HOME: Medicare Risk at Home Any stairs in or around the home?: Yes If so, are there any without handrails?: No Home free of loose throw rugs in walkways, pet beds, electrical cords, etc?: Yes Adequate lighting in your home to reduce risk of falls?: Yes Life alert?: No Use of a cane, walker or w/c?: Yes Grab bars in the bathroom?: Yes Shower chair or bench in shower?: No Elevated toilet seat or a handicapped toilet?: No  TIMED UP AND GO:  Was the test performed? No    Cognitive Function:        10/06/2023   10:47 AM  6CIT Screen  What Year? 0 points  What month? 0 points  What time? 0 points  Count back from 20 0 points  Months in reverse 0 points    Immunizations Immunization History  Administered Date(s) Administered   Influenza,inj,Quad PF,6+ Mos 10/25/2014, 10/30/2016, 10/07/2018, 09/18/2019, 09/08/2022   Pneumococcal Conjugate-13 08/12/2015   Pneumococcal Polysaccharide-23 08/09/2017   Tdap 08/09/2017    TDAP status: Up to date  Flu Vaccine status: Due, Education has been provided regarding the importance of this vaccine. Advised may receive this vaccine at local pharmacy or Health Dept. Aware to provide a copy of the vaccination record if obtained from local pharmacy or Health Dept. Verbalized acceptance and understanding.  Pneumococcal vaccine status: Up to date  Covid-19 vaccine status: Information provided on how to obtain vaccines.   Qualifies for Shingles Vaccine? Yes   Zostavax completed No   Shingrix Completed?: No.    Education has been provided regarding the importance of this vaccine. Patient has been advised to call insurance company to determine out of pocket expense if they have not yet  received this vaccine. Advised may also receive vaccine at local pharmacy or Health Dept. Verbalized acceptance and  understanding.  Screening Tests Health Maintenance  Topic Date Due   COVID-19 Vaccine (1) Never done   Zoster Vaccines- Shingrix (1 of 2) Never done   Diabetic kidney evaluation - Urine ACR  05/13/2023   FOOT EXAM  05/13/2023   OPHTHALMOLOGY EXAM  06/04/2023   INFLUENZA VACCINE  07/29/2023   Diabetic kidney evaluation - eGFR measurement  09/09/2023   LIPID PANEL  09/09/2023   HEMOGLOBIN A1C  01/06/2024   Medicare Annual Wellness (AWV)  10/05/2024   Colonoscopy  08/21/2026   DTaP/Tdap/Td (2 - Td or Tdap) 08/10/2027   Pneumonia Vaccine 50+ Years old  Completed   Hepatitis C Screening  Completed   HPV VACCINES  Aged Out   COLON CANCER SCREENING ANNUAL FOBT  Discontinued    Health Maintenance  Health Maintenance Due  Topic Date Due   COVID-19 Vaccine (1) Never done   Zoster Vaccines- Shingrix (1 of 2) Never done   Diabetic kidney evaluation - Urine ACR  05/13/2023   FOOT EXAM  05/13/2023   OPHTHALMOLOGY EXAM  06/04/2023   INFLUENZA VACCINE  07/29/2023   Diabetic kidney evaluation - eGFR measurement  09/09/2023   LIPID PANEL  09/09/2023    Colorectal cancer screening: Type of screening: Colonoscopy. Completed 08/22/2011. Repeat every 10 years  Lung Cancer Screening: (Low Dose CT Chest recommended if Age 48-80 years, 20 pack-year currently smoking OR have quit w/in 15years.) does not qualify.   Lung Cancer Screening Referral: DEFERRED  TO PCP   Additional Screening:  Hepatitis C Screening: does qualify; Completed 08/09/2017  Vision Screening: Recommended annual ophthalmology exams for early detection of glaucoma and other disorders of the eye. Is the patient up to date with their annual eye exam?  No  Who is the provider or what is the name of the office in which the patient attends annual eye exams? [facility]  If pt is not established with a provider, would they like to be referred to a provider to establish care? Yes .   Dental Screening: Recommended annual dental exams  for proper oral hygiene  Diabetic Foot Exam: Diabetic Foot Exam: Overdue, Pt has been advised about the importance in completing this exam. Pt is scheduled for diabetic foot exam on ON  HIS VISIT TODAY .  Community Resource Referral / Chronic Care Management: CRR required this visit?  No   CCM required this visit?  No    Plan:     I have personally reviewed and noted the following in the patient's chart:   Medical and social history Use of alcohol, tobacco or illicit drugs  Current medications and supplements including opioid prescriptions. Patient is not currently taking opioid prescriptions. Functional ability and status Nutritional status Physical activity Advanced directives List of other physicians Hospitalizations, surgeries, and ER visits in previous 12 months Vitals Screenings to include cognitive, depression, and falls Referrals and appointments  In addition, I have reviewed and discussed with patient certain preventive protocols, quality metrics, and best practice recommendations. A written personalized care plan for preventive services as well as general preventive health recommendations were provided to patient.     Derrell Lolling, CMA   10/06/2023   After Visit Summary: (In Person-Printed) AVS printed and given to the patient  Nurse Notes: IN PERSON [facility]     Mr. Lehrmann , Thank you for taking time to come for your Medicare Wellness  Visit. I appreciate your ongoing commitment to your health goals. Please review the following plan we discussed and let me know if I can assist you in the future.   These are the goals we discussed:  Goals      Blood Pressure < 140/90     HEMOGLOBIN A1C < 7.5     LDL CALC < 100        This is a list of the screening recommended for you and due dates:  Health Maintenance  Topic Date Due   COVID-19 Vaccine (1) Never done   Zoster (Shingles) Vaccine (1 of 2) Never done   Yearly kidney health urinalysis for diabetes  05/13/2023    Complete foot exam   05/13/2023   Eye exam for diabetics  06/04/2023   Flu Shot  07/29/2023   Yearly kidney function blood test for diabetes  09/09/2023   Lipid (cholesterol) test  09/09/2023   Hemoglobin A1C  01/06/2024   Medicare Annual Wellness Visit  10/05/2024   Colon Cancer Screening  08/21/2026   DTaP/Tdap/Td vaccine (2 - Td or Tdap) 08/10/2027   Pneumonia Vaccine  Completed   Hepatitis C Screening  Completed   HPV Vaccine  Aged Out   Stool Blood Test  Discontinued

## 2023-10-07 LAB — MICROALBUMIN / CREATININE URINE RATIO
Creatinine, Urine: 41.9 mg/dL
Microalb/Creat Ratio: 1911 mg/g{creat} — ABNORMAL HIGH (ref 0–29)
Microalbumin, Urine: 800.7 ug/mL

## 2023-10-07 NOTE — Assessment & Plan Note (Addendum)
With pressure 169/79 on recheck.  Currently prescribed lisinopril-hydrochlorothiazide 10-12.5 mg daily and reports that he has been taking this every day for the past 3 months up until he ran out of it yesterday.  He does take his blood pressure at home and states it can be 140-180 systolic but when he takes his medication it can be 120 systolic.  Denies any postural dizziness. - Continue lisinopril-hydrochlorothiazide 10-12.5 mg daily - Return in 1-2 weeks for nurse BP check and bring blood pressure log to next visit - BMP today

## 2023-10-07 NOTE — Assessment & Plan Note (Signed)
A1c today 10.0 which is up from 9.8 a year ago.  Based on pharmacy fill information he has rarely had his medication with his last fill of Janumet in 10/2022.  However he tells me that he has been taking this medication every day for the past 3 months without missing any doses until today due to running out of the medication.  This is confirmed through an interpreter.  He does not check his blood sugar at home.  We will plan to resume his Janumet and add Jardiance and then reevaluate in 2 to 3 months.  Patient will bring his medications with him to the next appointment as well as his glucometer. - Continue Janumet 50-1000 mg twice daily and start Jardiance 10 mg daily - Urine microalbumin today

## 2023-10-08 LAB — BMP8+ANION GAP
Anion Gap: 20 mmol/L — ABNORMAL HIGH (ref 10.0–18.0)
BUN/Creatinine Ratio: 13 (ref 10–24)
BUN: 23 mg/dL (ref 8–27)
CO2: 19 mmol/L — ABNORMAL LOW (ref 20–29)
Calcium: 9.7 mg/dL (ref 8.6–10.2)
Chloride: 98 mmol/L (ref 96–106)
Creatinine, Ser: 1.8 mg/dL — ABNORMAL HIGH (ref 0.76–1.27)
Glucose: 238 mg/dL — ABNORMAL HIGH (ref 70–99)
Potassium: 5 mmol/L (ref 3.5–5.2)
Sodium: 137 mmol/L (ref 134–144)
eGFR: 39 mL/min/{1.73_m2} — ABNORMAL LOW (ref >59)

## 2023-10-08 LAB — LIPID PANEL
Chol/HDL Ratio: 7 {ratio} — ABNORMAL HIGH (ref 0.0–5.0)
Cholesterol, Total: 267 mg/dL — ABNORMAL HIGH (ref 100–199)
HDL: 38 mg/dL — ABNORMAL LOW (ref >39)
LDL Chol Calc (NIH): 157 mg/dL — ABNORMAL HIGH (ref 0–99)
Triglycerides: 381 mg/dL — ABNORMAL HIGH (ref 0–149)
VLDL Cholesterol Cal: 72 mg/dL — ABNORMAL HIGH (ref 5–40)

## 2023-10-13 NOTE — Addendum Note (Signed)
Addended by: Rocky Morel on: 10/13/2023 11:52 AM   Modules accepted: Orders

## 2023-10-13 NOTE — Progress Notes (Signed)
Message sent to our [facility] speaking clinic staff to discuss lab results with the patient.  Labs show slightly worsening renal function with GFR still in CKD stage IIIb but he does have significant proteinuria with microalbumin creatinine ratio over 1000.  Referral sent to nephrology.  Also would like to ensure that he is taking his atorvastatin and we will plan to repeat his lipid panel at his next visit.

## 2023-10-15 NOTE — Progress Notes (Signed)
Internal Medicine Clinic Attending  Case discussed with the resident at the time of the visit.  We reviewed the resident's history and exam and pertinent patient test results.  I agree with the assessment, diagnosis, and plan of care documented in the resident's note.  

## 2023-10-21 ENCOUNTER — Ambulatory Visit: Payer: Medicare Other | Admitting: *Deleted

## 2023-10-21 NOTE — Progress Notes (Signed)
    Juan [name] presented today for blood pressure check. Patient is prescribed blood pressure medications and I confirmed that patient did take their blood pressure medication prior to today's appointment. Blood pressure was taken in the usual and appropriate manner using an automated BP cuff.     Vitals:   10/21/23 1407  BP: 119/67      Results of today's visit will be routed to The  [facility]  for review and further management. LOV with Dr Geraldo Pitter.

## 2023-11-02 NOTE — Progress Notes (Signed)
Internal Medicine Clinic Attending  I reviewed the AWV findings.  I agree with the assessment, diagnosis, and plan of care documented in the AWV note.     

## 2023-11-05 ENCOUNTER — Ambulatory Visit (INDEPENDENT_AMBULATORY_CARE_PROVIDER_SITE_OTHER): Payer: Medicare Other

## 2023-11-05 DIAGNOSIS — I441 Atrioventricular block, second degree: Secondary | ICD-10-CM | POA: Diagnosis not present

## 2023-11-08 LAB — CUP PACEART REMOTE DEVICE CHECK
Battery Remaining Longevity: 89 mo
Battery Voltage: 2.98 V
Brady Statistic AP VP Percent: 10.21 %
Brady Statistic AP VS Percent: 0 %
Brady Statistic AS VP Percent: 89.61 %
Brady Statistic AS VS Percent: 0.18 %
Brady Statistic RA Percent Paced: 10.24 %
Brady Statistic RV Percent Paced: 99.82 %
Date Time Interrogation Session: 20241107224108
Implantable Lead Connection Status: 753985
Implantable Lead Connection Status: 753985
Implantable Lead Implant Date: 20210210
Implantable Lead Implant Date: 20210210
Implantable Lead Location: 753859
Implantable Lead Location: 753860
Implantable Lead Model: 3830
Implantable Lead Model: 5076
Implantable Pulse Generator Implant Date: 20210210
Lead Channel Impedance Value: 323 Ohm
Lead Channel Impedance Value: 380 Ohm
Lead Channel Impedance Value: 399 Ohm
Lead Channel Impedance Value: 589 Ohm
Lead Channel Pacing Threshold Amplitude: 0.5 V
Lead Channel Pacing Threshold Amplitude: 0.5 V
Lead Channel Pacing Threshold Pulse Width: 0.4 ms
Lead Channel Pacing Threshold Pulse Width: 0.4 ms
Lead Channel Sensing Intrinsic Amplitude: 31.625 mV
Lead Channel Sensing Intrinsic Amplitude: 31.625 mV
Lead Channel Sensing Intrinsic Amplitude: 6 mV
Lead Channel Sensing Intrinsic Amplitude: 6 mV
Lead Channel Setting Pacing Amplitude: 1.5 V
Lead Channel Setting Pacing Amplitude: 2.5 V
Lead Channel Setting Pacing Pulse Width: 0.4 ms
Lead Channel Setting Sensing Sensitivity: 2.8 mV
Zone Setting Status: 755011
Zone Setting Status: 755011

## 2023-11-16 NOTE — Progress Notes (Signed)
Remote pacemaker transmission.   

## 2023-12-07 ENCOUNTER — Encounter: Payer: Medicare Other | Admitting: Cardiology

## 2023-12-07 NOTE — Progress Notes (Unsigned)
  Electrophysiology Office Note:   Date:  12/07/2023  ID:  Juan [name], DOB 1951/11/09, MRN [id number]  Primary Cardiologist: None Electrophysiologist: Nobie Putnam, MD  {Click to update primary MD,subspecialty MD or APP then REFRESH:1}    History of Present Illness:   Juan [name] is a 72 y.o. male with h/o HTN, CKD, DM2, and CVA with residual R sided weakness who is being seen today to establish care for his cardiac pacemaker.  Patient presented to the ED on 02/06/2020 with weakness after a fall.  He was found to have 2-1 AV block on ECG during that time and was felt of had [place] syncope.  He underwent permanent pacemaker implant on 02/07/2020.  Review of systems complete and found to be negative unless listed in HPI.   EP Information / Studies Reviewed:    {EKGtoday:28818}      ***  Risk Assessment/Calculations:   {Does this patient have ATRIAL FIBRILLATION?:[phone]} No BP recorded.  {Refresh Note OR Click here to enter BP  :1}***        Physical Exam:   VS:  There were no vitals taken for this visit.   Wt Readings from Last 3 Encounters:  10/06/23 154 lb 6.4 oz (70 kg)  10/06/23 154 lb 14.4 oz (70.3 kg)  09/08/22 151 lb 14.4 oz (68.9 kg)     GEN: Well nourished, well developed in no acute distress NECK: No JVD; No carotid bruits CARDIAC: {EPRHYTHM:28826}, no murmurs, rubs, gallops RESPIRATORY:  Clear to auscultation without rales, wheezing or rhonchi  ABDOMEN: Soft, non-tender, non-distended EXTREMITIES:  No edema; No deformity   ASSESSMENT AND PLAN:   ***  Follow up with {WUJWJ:19147} {EPFOLLOW WG:95621}  Signed, Nobie Putnam, MD

## 2023-12-08 ENCOUNTER — Encounter: Payer: Self-pay | Admitting: Cardiology

## 2023-12-26 NOTE — Progress Notes (Deleted)
°  Electrophysiology Office Note:   Date:  12/26/2023  ID:  Juan [name], DOB 1951/01/28, MRN [id number]  Primary Cardiologist: None Primary Heart Failure: None Electrophysiologist: Nobie Putnam, MD  {Click to update primary MD,subspecialty MD or APP then REFRESH:1}    History of Present Illness:   Juan [name] is a 72 y.o. male with h/o T2DM, HTN, carotid artery stenosis s/p endarterectomy, CVA with residual right sided weakness, CKD stage IIIa and Mobitz II AV block s/p pacemaker who is being seen today to establish care for his cardiac device.   Patient admitted on 02/06/20 with syncope. He was found to have heart rates in the 40-50s and 2:1 AV block. He underwent pacemaker implant on 02/07/20.  Review of systems complete and found to be negative unless listed in HPI.   EP Information / Studies Reviewed:    {EKGtoday:28818}      Echo 02/07/20:   1. Left ventricular ejection fraction, by estimation, is 65 to 70%. The  left ventricle has normal function. The left ventrical has no regional  wall motion abnormalities. Left ventricular diastolic parameters were  normal.   2. Right ventricular systolic function is normal. The right ventricular  size is normal. Tricuspid regurgitation signal is inadequate for assessing  PA pressure.   3. Trivial mitral valve regurgitation.   4. The aortic valve is normal in structure and function. Aortic valve  regurgitation is not visualized. No aortic stenosis is present.   5. The inferior vena cava is normal in size with greater than 50%  respiratory variability, suggesting right atrial pressure of 3 mmHg.    Physical Exam:   VS:  There were no vitals taken for this visit.   Wt Readings from Last 3 Encounters:  10/06/23 154 lb 6.4 oz (70 kg)  10/06/23 154 lb 14.4 oz (70.3 kg)  09/08/22 151 lb 14.4 oz (68.9 kg)     GEN: Well nourished, well developed in no acute distress NECK: No JVD; No carotid bruits CARDIAC: {EPRHYTHM:28826}, no murmurs, rubs,  gallops RESPIRATORY:  Clear to auscultation without rales, wheezing or rhonchi  ABDOMEN: Soft, non-tender, non-distended EXTREMITIES:  No edema; No deformity   ASSESSMENT AND PLAN:   Juan [name] is a 72 y.o. male with h/o T2DM, HTN, carotid artery stenosis s/p endarterectomy, CVA with residual right sided weakness, CKD stage IIIa and Mobitz II AV block s/p pacemaker who is being seen today to establish care for his cardiac device.   #. Pacemaker in situ: #. Mobitz II AV block:  #. Hypertension *** goal today.  Recommend checking blood pressures 1-2 times per week at home and recording the values.  Recommend bringing these recordings to the primary care physician.   Follow up with {XBJYN:82956} {EPFOLLOW OZ:30865}  Signed, Nobie Putnam, MD

## 2023-12-27 ENCOUNTER — Ambulatory Visit: Payer: Medicare Other | Attending: Cardiology | Admitting: Cardiology

## 2023-12-28 ENCOUNTER — Encounter: Payer: Self-pay | Admitting: Cardiology

## 2024-01-25 DIAGNOSIS — I129 Hypertensive chronic kidney disease with stage 1 through stage 4 chronic kidney disease, or unspecified chronic kidney disease: Secondary | ICD-10-CM | POA: Diagnosis not present

## 2024-01-25 DIAGNOSIS — R809 Proteinuria, unspecified: Secondary | ICD-10-CM | POA: Diagnosis not present

## 2024-01-25 DIAGNOSIS — N1832 Chronic kidney disease, stage 3b: Secondary | ICD-10-CM | POA: Diagnosis not present

## 2024-01-25 LAB — LAB REPORT - SCANNED
Albumin, Urine POC: 556.2
Creatinine, POC: 185.7 mg/dL
EGFR: 43
Microalb Creat Ratio: 300

## 2024-02-04 ENCOUNTER — Ambulatory Visit (INDEPENDENT_AMBULATORY_CARE_PROVIDER_SITE_OTHER): Payer: PPO

## 2024-02-04 DIAGNOSIS — I441 Atrioventricular block, second degree: Secondary | ICD-10-CM | POA: Diagnosis not present

## 2024-02-05 LAB — CUP PACEART REMOTE DEVICE CHECK
Battery Remaining Longevity: 86 mo
Battery Voltage: 2.98 V
Brady Statistic AP VP Percent: 11.59 %
Brady Statistic AP VS Percent: 0.07 %
Brady Statistic AS VP Percent: 87.48 %
Brady Statistic AS VS Percent: 0.85 %
Brady Statistic RA Percent Paced: 11.79 %
Brady Statistic RV Percent Paced: 99.07 %
Date Time Interrogation Session: 20250207035555
Implantable Lead Connection Status: 753985
Implantable Lead Connection Status: 753985
Implantable Lead Implant Date: 20210210
Implantable Lead Implant Date: 20210210
Implantable Lead Location: 753859
Implantable Lead Location: 753860
Implantable Lead Model: 3830
Implantable Lead Model: 5076
Implantable Pulse Generator Implant Date: 20210210
Lead Channel Impedance Value: 285 Ohm
Lead Channel Impedance Value: 361 Ohm
Lead Channel Impedance Value: 399 Ohm
Lead Channel Impedance Value: 589 Ohm
Lead Channel Pacing Threshold Amplitude: 0.625 V
Lead Channel Pacing Threshold Amplitude: 0.625 V
Lead Channel Pacing Threshold Pulse Width: 0.4 ms
Lead Channel Pacing Threshold Pulse Width: 0.4 ms
Lead Channel Sensing Intrinsic Amplitude: 31.625 mV
Lead Channel Sensing Intrinsic Amplitude: 31.625 mV
Lead Channel Sensing Intrinsic Amplitude: 5.375 mV
Lead Channel Sensing Intrinsic Amplitude: 5.375 mV
Lead Channel Setting Pacing Amplitude: 1.5 V
Lead Channel Setting Pacing Amplitude: 2.5 V
Lead Channel Setting Pacing Pulse Width: 0.4 ms
Lead Channel Setting Sensing Sensitivity: 2.8 mV
Zone Setting Status: 755011
Zone Setting Status: 755011

## 2024-02-08 DIAGNOSIS — R319 Hematuria, unspecified: Secondary | ICD-10-CM | POA: Diagnosis not present

## 2024-02-09 DIAGNOSIS — R310 Gross hematuria: Secondary | ICD-10-CM | POA: Diagnosis not present

## 2024-02-09 DIAGNOSIS — R3916 Straining to void: Secondary | ICD-10-CM | POA: Diagnosis not present

## 2024-02-09 DIAGNOSIS — R3914 Feeling of incomplete bladder emptying: Secondary | ICD-10-CM | POA: Diagnosis not present

## 2024-02-10 ENCOUNTER — Encounter: Payer: Medicare Other | Admitting: Internal Medicine

## 2024-02-13 ENCOUNTER — Encounter: Payer: Self-pay | Admitting: Internal Medicine

## 2024-02-14 ENCOUNTER — Other Ambulatory Visit: Payer: Self-pay | Admitting: Student

## 2024-02-14 DIAGNOSIS — E1151 Type 2 diabetes mellitus with diabetic peripheral angiopathy without gangrene: Secondary | ICD-10-CM

## 2024-02-25 ENCOUNTER — Other Ambulatory Visit: Payer: Self-pay

## 2024-02-25 ENCOUNTER — Emergency Department (HOSPITAL_COMMUNITY)
Admission: EM | Admit: 2024-02-25 | Discharge: 2024-02-25 | Disposition: A | Discharge status: Home / Self Care | Payer: Medicare Other | Attending: Emergency Medicine | Admitting: Emergency Medicine

## 2024-02-25 ENCOUNTER — Emergency Department (HOSPITAL_COMMUNITY): Payer: Medicare Other

## 2024-02-25 ENCOUNTER — Encounter (HOSPITAL_COMMUNITY): Payer: Self-pay

## 2024-02-25 DIAGNOSIS — Z7984 Long term (current) use of oral hypoglycemic drugs: Secondary | ICD-10-CM | POA: Diagnosis not present

## 2024-02-25 DIAGNOSIS — I129 Hypertensive chronic kidney disease with stage 1 through stage 4 chronic kidney disease, or unspecified chronic kidney disease: Secondary | ICD-10-CM | POA: Diagnosis not present

## 2024-02-25 DIAGNOSIS — I6782 Cerebral ischemia: Secondary | ICD-10-CM | POA: Diagnosis not present

## 2024-02-25 DIAGNOSIS — Z8673 Personal history of transient ischemic attack (TIA), and cerebral infarction without residual deficits: Secondary | ICD-10-CM | POA: Insufficient documentation

## 2024-02-25 DIAGNOSIS — Z20822 Contact with and (suspected) exposure to covid-19: Secondary | ICD-10-CM | POA: Diagnosis not present

## 2024-02-25 DIAGNOSIS — W01190A Fall on same level from slipping, tripping and stumbling with subsequent striking against furniture, initial encounter: Secondary | ICD-10-CM | POA: Diagnosis not present

## 2024-02-25 DIAGNOSIS — R531 Weakness: Secondary | ICD-10-CM | POA: Diagnosis not present

## 2024-02-25 DIAGNOSIS — N179 Acute kidney failure, unspecified: Secondary | ICD-10-CM | POA: Diagnosis not present

## 2024-02-25 DIAGNOSIS — N189 Chronic kidney disease, unspecified: Secondary | ICD-10-CM | POA: Insufficient documentation

## 2024-02-25 DIAGNOSIS — Z950 Presence of cardiac pacemaker: Secondary | ICD-10-CM | POA: Diagnosis not present

## 2024-02-25 DIAGNOSIS — J9811 Atelectasis: Secondary | ICD-10-CM | POA: Diagnosis not present

## 2024-02-25 DIAGNOSIS — E1122 Type 2 diabetes mellitus with diabetic chronic kidney disease: Secondary | ICD-10-CM | POA: Diagnosis not present

## 2024-02-25 DIAGNOSIS — R000 Tachycardia, unspecified: Secondary | ICD-10-CM | POA: Diagnosis not present

## 2024-02-25 DIAGNOSIS — Z981 Arthrodesis status: Secondary | ICD-10-CM | POA: Diagnosis not present

## 2024-02-25 DIAGNOSIS — J101 Influenza due to other identified influenza virus with other respiratory manifestations: Secondary | ICD-10-CM | POA: Insufficient documentation

## 2024-02-25 DIAGNOSIS — Z79899 Other long term (current) drug therapy: Secondary | ICD-10-CM | POA: Diagnosis not present

## 2024-02-25 DIAGNOSIS — R509 Fever, unspecified: Secondary | ICD-10-CM | POA: Diagnosis not present

## 2024-02-25 DIAGNOSIS — E860 Dehydration: Secondary | ICD-10-CM | POA: Diagnosis not present

## 2024-02-25 DIAGNOSIS — R0989 Other specified symptoms and signs involving the circulatory and respiratory systems: Secondary | ICD-10-CM | POA: Diagnosis not present

## 2024-02-25 DIAGNOSIS — Z7982 Long term (current) use of aspirin: Secondary | ICD-10-CM | POA: Diagnosis not present

## 2024-02-25 DIAGNOSIS — S0990XA Unspecified injury of head, initial encounter: Secondary | ICD-10-CM | POA: Diagnosis not present

## 2024-02-25 LAB — BASIC METABOLIC PANEL
Anion gap: 9 (ref 5–15)
BUN: 17 mg/dL (ref 8–23)
CO2: 21 mmol/L — ABNORMAL LOW (ref 22–32)
Calcium: 8.6 mg/dL — ABNORMAL LOW (ref 8.9–10.3)
Chloride: 102 mmol/L (ref 98–111)
Creatinine, Ser: 2.25 mg/dL — ABNORMAL HIGH (ref 0.61–1.24)
GFR, Estimated: 30 mL/min — ABNORMAL LOW (ref >60)
Glucose, Bld: 226 mg/dL — ABNORMAL HIGH (ref 70–99)
Potassium: 4.3 mmol/L (ref 3.5–5.1)
Sodium: 132 mmol/L — ABNORMAL LOW (ref 135–145)

## 2024-02-25 LAB — URINALYSIS, ROUTINE W REFLEX MICROSCOPIC
Bacteria, UA: NONE SEEN
Bilirubin Urine: NEGATIVE
Glucose, UA: >=500 mg/dL — AB
Ketones, ur: NEGATIVE mg/dL
Leukocytes,Ua: NEGATIVE
Nitrite: NEGATIVE
Protein, ur: 30 mg/dL — AB
Specific Gravity, Urine: 1.007 (ref 1.005–1.030)
pH: 5 (ref 5.0–8.0)

## 2024-02-25 LAB — RESP PANEL BY RT-PCR (RSV, FLU A&B, COVID)  RVPGX2
Influenza A by PCR: POSITIVE — AB
Influenza B by PCR: NEGATIVE
Resp Syncytial Virus by PCR: NEGATIVE
SARS Coronavirus 2 by RT PCR: NEGATIVE

## 2024-02-25 LAB — CBC
HCT: 37.5 % — ABNORMAL LOW (ref 39.0–52.0)
Hemoglobin: 12 g/dL — ABNORMAL LOW (ref 13.0–17.0)
MCH: 26.3 pg (ref 26.0–34.0)
MCHC: 32 g/dL (ref 30.0–36.0)
MCV: 82.2 fL (ref 80.0–100.0)
Platelets: 199 10*3/uL (ref 150–400)
RBC: 4.56 MIL/uL (ref 4.22–5.81)
RDW: 15.4 % (ref 11.5–15.5)
WBC: 9.4 10*3/uL (ref 4.0–10.5)
nRBC: 0 % (ref 0.0–0.2)

## 2024-02-25 LAB — CBG MONITORING, ED: Glucose-Capillary: 231 mg/dL — ABNORMAL HIGH (ref 70–99)

## 2024-02-25 MED ORDER — OSELTAMIVIR PHOSPHATE 30 MG PO CAPS: 30.0000 mg | ORAL_CAPSULE | Freq: Two times a day (BID) | ORAL | 0 refills | Status: AC

## 2024-02-25 MED ORDER — LACTATED RINGERS IV BOLUS
1000.0000 mL | Freq: Once | INTRAVENOUS | Status: AC
  Administered 2024-02-25: 1000 mL via INTRAVENOUS

## 2024-02-25 MED ORDER — ACETAMINOPHEN 500 MG PO TABS
1000.0000 mg | ORAL_TABLET | Freq: Once | ORAL | Status: DC
  Filled 2024-02-25: qty 2

## 2024-02-25 MED ORDER — ACETAMINOPHEN 325 MG PO TABS
650.0000 mg | ORAL_TABLET | Freq: Once | ORAL | Status: AC | PRN
  Administered 2024-02-25: 650 mg via ORAL
  Filled 2024-02-25: qty 2

## 2024-02-25 NOTE — ED Notes (Signed)
 Pt ambulated to the restroom with moderate one person assistance.

## 2024-02-25 NOTE — ED Provider Notes (Signed)
 [facility] EMERGENCY DEPARTMENT AT [facility] Provider Note   CSN: [id number] Arrival date & time: 02/25/24  4098     History  Chief Complaint  Patient presents with   Weakness    Juan [name] is a 73 y.o. male.  Patient is a 73 year old male with a history of prior stroke with right-sided deficits, hyperlipidemia, hypertension, diabetes, CKD, pacemaker who is presenting today with family due to 24 hours of generalized weakness, fatigue, cough and malaise.  Family reports he was so weak today he could not get out of bed.  They were having to basically lift him even to get him to the bathroom and reported yesterday he was weak and he fell forward to the floor hitting his face on the table.  He had no loss of consciousness and has not had any vomiting or diarrhea.  He denies any significant shortness of breath.  No abdominal pain at this time.  No new confusion.  The history is provided by the patient and a relative. The history is limited by a language barrier. A language interpreter was used.  Weakness      Home Medications Prior to Admission medications   Medication Sig Start Date End Date Taking? Authorizing Provider  oseltamivir (TAMIFLU) 30 MG capsule Take 1 capsule (30 mg total) by mouth every 12 (twelve) hours. 02/25/24  Yes Gwyneth Sprout, MD  aspirin EC 81 MG tablet Take 1 tablet (81 mg total) by mouth daily. 10/06/23   Rocky Morel, DO  atorvastatin (LIPITOR) 80 MG tablet Take 1 tablet (80 mg total) by mouth daily. 10/06/23   Rocky Morel, DO  Blood Glucose Monitoring Suppl (FREESTYLE LITE) DEVI Use to check blood sugar up to 7 times a week 10/06/23   Rocky Morel, DO  empagliflozin (JARDIANCE) 10 MG TABS tablet TAKE 1 TABLET(10 MG) BY MOUTH DAILY BEFORE BREAKFAST 02/14/24   Gust Rung, DO  glucose blood (FREESTYLE LITE) test strip Check blood sugar up to 7 times a week as instructed 10/06/23   Rocky Morel, DO  Lancets (FREESTYLE) lancets Check blood  sugar up to 7 times a week as instructed 10/06/23   Rocky Morel, DO  lisinopril-hydrochlorothiazide (ZESTORETIC) 10-12.5 MG tablet Take 1 tablet by mouth daily. 10/06/23   Rocky Morel, DO  sitaGLIPtin-metformin (JANUMET) 50-1000 MG tablet Take 1 tablet by mouth 2 (two) times daily with a meal. 10/06/23   Rocky Morel, DO      Allergies    Patient has no known allergies.    Review of Systems   Review of Systems  Neurological:  Positive for weakness.    Physical Exam Updated Vital Signs BP 106/76   Pulse 85   Temp 99.2 F (37.3 C) (Oral)   Resp 16   Ht 5\' 5"  (1.651 m)   Wt 70 kg   SpO2 98%   BMI 25.68 kg/m  Physical Exam Vitals and nursing note reviewed.  Constitutional:      General: He is not in acute distress.    Appearance: He is well-developed.  HENT:     Head: Normocephalic and atraumatic.     Mouth/Throat:     Mouth: Mucous membranes are dry.  Eyes:     Conjunctiva/sclera: Conjunctivae normal.     Pupils: Pupils are equal, round, and reactive to light.  Cardiovascular:     Rate and Rhythm: Regular rhythm. Tachycardia present.     Heart sounds: No murmur heard. Pulmonary:     Effort: Pulmonary effort is  normal. No respiratory distress.     Breath sounds: Normal breath sounds. No wheezing or rales.  Abdominal:     General: There is no distension.     Palpations: Abdomen is soft.     Tenderness: There is no abdominal tenderness. There is no guarding or rebound.  Musculoskeletal:        General: No tenderness. Normal range of motion.     Cervical back: Normal range of motion and neck supple.     Right lower leg: No edema.     Left lower leg: No edema.  Skin:    General: Skin is warm and dry.     Findings: No erythema or rash.  Neurological:     Mental Status: He is alert and oriented to person, place, and time. Mental status is at baseline.     Comments: This noted in the right upper extremity and mild left-sided facial droop  Psychiatric:         Behavior: Behavior normal.     ED Results / Procedures / Treatments   Labs (all labs ordered are listed, but only abnormal results are displayed) Labs Reviewed  RESP PANEL BY RT-PCR (RSV, FLU A&B, COVID)  RVPGX2 - Abnormal; Notable for the following components:      Result Value   Influenza A by PCR POSITIVE (*)    All other components within normal limits  BASIC METABOLIC PANEL - Abnormal; Notable for the following components:   Sodium 132 (*)    CO2 21 (*)    Glucose, Bld 226 (*)    Creatinine, Ser 2.25 (*)    Calcium 8.6 (*)    GFR, Estimated 30 (*)    All other components within normal limits  CBC - Abnormal; Notable for the following components:   Hemoglobin 12.0 (*)    HCT 37.5 (*)    All other components within normal limits  CBG MONITORING, ED - Abnormal; Notable for the following components:   Glucose-Capillary 231 (*)    All other components within normal limits  URINALYSIS, ROUTINE W REFLEX MICROSCOPIC    EKG EKG Interpretation Date/Time:  Friday February 25 2024 08:33:28 EST Ventricular Rate:  112 PR Interval:  170 QRS Duration:  158 QT Interval:  354 QTC Calculation: 483 R Axis:   -38  Text Interpretation: Atrial-sensed ventricular-paced rhythm When compared with ECG of 14-Jan-2022 17:53, PREVIOUS ECG IS PRESENT No significant change since last tracing Confirmed by Gwyneth Sprout ([id number]) on 02/25/2024 10:36:17 AM  Radiology CT Head Wo Contrast Result Date: 02/25/2024 CLINICAL DATA:  Head trauma EXAM: CT HEAD WITHOUT CONTRAST TECHNIQUE: Contiguous axial images were obtained from the base of the skull through the vertex without intravenous contrast. RADIATION DOSE REDUCTION: This exam was performed according to the departmental dose-optimization program which includes automated exposure control, adjustment of the mA and/or kV according to patient size and/or use of iterative reconstruction technique. COMPARISON:  CT head 01/14/2022 FINDINGS: Brain: No acute  intracranial hemorrhage. No CT evidence of acute infarct. Encephalomalacia in the left parietal and left occipital lobes compatible with remote infarcts. Remote lacunar infarcts in the bilateral basal ganglia more so on the right. Possible additional remote infarct in the right cerebellum. Nonspecific hypoattenuation in the periventricular and subcortical white matter favored to reflect chronic microvascular ischemic changes. No edema, mass effect, or midline shift. The basilar cisterns are patent. Ventricles: Prominence of the ventricles suggesting underlying parenchymal volume loss. Vascular: Atherosclerotic calcifications of the carotid siphons and intracranial vertebral arteries.  No hyperdense vessel. Skull: The calvarium is intact. Similar chronic deformity of the left lamina papyracea. Orbits: Orbits are symmetric. Sinuses: Mild mucosal thickening in the paranasal sinuses. Other: Mastoid air cells are clear. IMPRESSION: 1. No acute intracranial abnormality. 2. Remote infarcts in the left parietal and left occipital lobes. Remote lacunar infarcts in the bilateral basal ganglia. 3. Chronic microvascular ischemic changes and parenchymal volume loss. Electronically Signed   By: Emily Filbert M.D.   On: 02/25/2024 12:00   DG Chest 2 View Result Date: 02/25/2024 CLINICAL DATA:  Fever EXAM: CHEST - 2 VIEW COMPARISON:  Chest radiograph 01/14/2022 FINDINGS: Dual lead pacer apparatus overlies the left hemithorax. Anterior cervical spinal fusion hardware. Stable cardiac and mediastinal contours. Low lung volume exam. Basilar atelectasis. No pleural effusion or pneumothorax. Thoracic spine degenerative changes. IMPRESSION: Low lung volume exam with basilar atelectasis. Electronically Signed   By: Annia Belt M.D.   On: 02/25/2024 10:22    Procedures Procedures    Medications Ordered in ED Medications  acetaminophen (TYLENOL) tablet 1,000 mg (0 mg Oral Hold 02/25/24 1049)  acetaminophen (TYLENOL) tablet 650 mg  (650 mg Oral Given 02/25/24 0843)  lactated ringers bolus 1,000 mL (0 mLs Intravenous Stopped 02/25/24 1238)    ED Course/ Medical Decision Making/ A&P                                 Medical Decision Making Amount and/or Complexity of Data Reviewed External Data Reviewed: notes. Labs: ordered. Decision-making details documented in ED Course. Radiology: ordered and independent interpretation performed. Decision-making details documented in ED Course. ECG/medicine tests: ordered and independent interpretation performed. Decision-making details documented in ED Course.  Risk OTC drugs. Prescription drug management.   Pt with multiple medical problems and comorbidities and presenting today with a complaint that caries a high risk for morbidity and mortality.  Pt with symptoms consistent with influenza.  Normal exam here but is febrile.  No signs of breathing difficulty  No signs of strep pharyngitis, otitis or abnormal abdominal findings.  However patient is very weak and was having trouble even getting out of bed at home.  Will give fever control and fluids.  Also he fell yesterday hitting his head.  He takes aspirin but no other anticoagulation. I independently interpreted patient's labs and EKG.  EKG with a paced rhythm, CBC without acute findings, viral panel is flu A+, BMP with AKI today with creatinine of 2.25 from his baseline of 1.5-1.8 with otherwise normal electrolytes. I have independently visualized and interpreted pt's images today. CXR wnl and rapid strep wnl.  Will continue antipyretica and rest and fluids.  Head CT pendind to ensure no trauma from fall.  1:10 PM CT head is negative for acute findings.  Patient was able to get out of bed and walk with a one-person assistance.  Patient's family is at bedside and feels comfortable taking him home.  Will start Tamiflu and discussed ongoing hydration and fever control.  They were given return precautions.         Final  Clinical Impression(s) / ED Diagnoses Final diagnoses:  Dehydration  Influenza A  Weakness  AKI (acute kidney injury) (HCC)    Rx / DC Orders ED Discharge Orders          Ordered    oseltamivir (TAMIFLU) 30 MG capsule  Every 12 hours        02/25/24 1309  Gwyneth Sprout, MD 02/25/24 1310

## 2024-02-25 NOTE — ED Triage Notes (Addendum)
 Interpreter used. Pt c/o feeling weak since yesterday, denies fevers, dizziness, or pain.  Pt has an abrasion to left temple d/t falling and hitting head, denies taking blood thinners.

## 2024-02-25 NOTE — Discharge Instructions (Signed)
 He was dehydrated today and does have the flu.  He will have fever fairly regularly over the next 5 to 6 days.  It would be a good idea to give 2 extra strength Tylenol every 6 hours for fever.  Also making sure he is staying hydrated.  Coconut water would be good.  Given a prescription for Tamiflu for the flu however if this medication causes him to have nausea and vomiting he should discontinue it.  If he starts having trouble breathing or symptoms are getting worse return to the emergency room.

## 2024-03-06 DIAGNOSIS — R310 Gross hematuria: Secondary | ICD-10-CM | POA: Diagnosis not present

## 2024-03-06 DIAGNOSIS — N3289 Other specified disorders of bladder: Secondary | ICD-10-CM | POA: Diagnosis not present

## 2024-03-07 NOTE — Progress Notes (Signed)
 Remote pacemaker transmission.

## 2024-03-07 NOTE — Addendum Note (Signed)
 Addended by: Elease Etienne A on: 03/07/2024 03:08 PM   Modules accepted: Orders

## 2024-03-21 ENCOUNTER — Other Ambulatory Visit: Payer: Self-pay

## 2024-03-21 DIAGNOSIS — E1151 Type 2 diabetes mellitus with diabetic peripheral angiopathy without gangrene: Secondary | ICD-10-CM

## 2024-03-21 MED ORDER — EMPAGLIFLOZIN 10 MG PO TABS
10.0000 mg | ORAL_TABLET | Freq: Every day | ORAL | 0 refills | Status: DC
Start: 2024-03-21 — End: 2024-04-25

## 2024-03-28 ENCOUNTER — Encounter: Admitting: Student

## 2024-04-25 ENCOUNTER — Other Ambulatory Visit: Payer: Self-pay

## 2024-04-25 DIAGNOSIS — E1151 Type 2 diabetes mellitus with diabetic peripheral angiopathy without gangrene: Secondary | ICD-10-CM

## 2024-04-25 MED ORDER — EMPAGLIFLOZIN 10 MG PO TABS: 10.0000 mg | ORAL_TABLET | Freq: Every day | ORAL | 3 refills | Status: AC

## 2024-04-25 NOTE — Telephone Encounter (Signed)
 Medication sent to pharmacy

## 2024-05-05 ENCOUNTER — Ambulatory Visit (INDEPENDENT_AMBULATORY_CARE_PROVIDER_SITE_OTHER): Payer: PPO

## 2024-05-05 DIAGNOSIS — I441 Atrioventricular block, second degree: Secondary | ICD-10-CM | POA: Diagnosis not present

## 2024-05-05 LAB — CUP PACEART REMOTE DEVICE CHECK
Battery Remaining Longevity: 80 mo
Battery Voltage: 2.98 V
Brady Statistic AP VP Percent: 14.77 %
Brady Statistic AP VS Percent: 0.09 %
Brady Statistic AS VP Percent: 84.48 %
Brady Statistic AS VS Percent: 0.65 %
Brady Statistic RA Percent Paced: 14.95 %
Brady Statistic RV Percent Paced: 99.26 %
Date Time Interrogation Session: 20250509060850
Implantable Lead Connection Status: 753985
Implantable Lead Connection Status: 753985
Implantable Lead Implant Date: 20210210
Implantable Lead Implant Date: 20210210
Implantable Lead Location: 753859
Implantable Lead Location: 753860
Implantable Lead Model: 3830
Implantable Lead Model: 5076
Implantable Pulse Generator Implant Date: 20210210
Lead Channel Impedance Value: 285 Ohm
Lead Channel Impedance Value: 342 Ohm
Lead Channel Impedance Value: 361 Ohm
Lead Channel Impedance Value: 570 Ohm
Lead Channel Pacing Threshold Amplitude: 0.5 V
Lead Channel Pacing Threshold Amplitude: 0.625 V
Lead Channel Pacing Threshold Pulse Width: 0.4 ms
Lead Channel Pacing Threshold Pulse Width: 0.4 ms
Lead Channel Sensing Intrinsic Amplitude: 31.625 mV
Lead Channel Sensing Intrinsic Amplitude: 31.625 mV
Lead Channel Sensing Intrinsic Amplitude: 5.125 mV
Lead Channel Sensing Intrinsic Amplitude: 5.125 mV
Lead Channel Setting Pacing Amplitude: 1.5 V
Lead Channel Setting Pacing Amplitude: 2.5 V
Lead Channel Setting Pacing Pulse Width: 0.4 ms
Lead Channel Setting Sensing Sensitivity: 2.8 mV
Zone Setting Status: 755011
Zone Setting Status: 755011

## 2024-05-08 ENCOUNTER — Encounter: Payer: Self-pay | Admitting: Internal Medicine

## 2024-06-05 ENCOUNTER — Encounter: Admitting: Internal Medicine

## 2024-06-08 ENCOUNTER — Encounter: Admitting: Internal Medicine

## 2024-06-09 NOTE — Progress Notes (Signed)
 Remote pacemaker transmission.

## 2024-08-04 ENCOUNTER — Ambulatory Visit: Payer: PPO

## 2024-08-04 DIAGNOSIS — I441 Atrioventricular block, second degree: Secondary | ICD-10-CM

## 2024-08-04 LAB — CUP PACEART REMOTE DEVICE CHECK
Battery Remaining Longevity: 75 mo
Battery Voltage: 2.97 V
Brady Statistic AP VP Percent: 16.2 %
Brady Statistic AP VS Percent: 0.1 %
Brady Statistic AS VP Percent: 83.21 %
Brady Statistic AS VS Percent: 0.49 %
Brady Statistic RA Percent Paced: 16.35 %
Brady Statistic RV Percent Paced: 99.41 %
Date Time Interrogation Session: 20250807210131
Implantable Lead Connection Status: 753985
Implantable Lead Connection Status: 753985
Implantable Lead Implant Date: 20210210
Implantable Lead Implant Date: 20210210
Implantable Lead Location: 753859
Implantable Lead Location: 753860
Implantable Lead Model: 3830
Implantable Lead Model: 5076
Implantable Pulse Generator Implant Date: 20210210
Lead Channel Impedance Value: 304 Ohm
Lead Channel Impedance Value: 361 Ohm
Lead Channel Impedance Value: 361 Ohm
Lead Channel Impedance Value: 551 Ohm
Lead Channel Pacing Threshold Amplitude: 0.5 V
Lead Channel Pacing Threshold Amplitude: 0.75 V
Lead Channel Pacing Threshold Pulse Width: 0.4 ms
Lead Channel Pacing Threshold Pulse Width: 0.4 ms
Lead Channel Sensing Intrinsic Amplitude: 31.625 mV
Lead Channel Sensing Intrinsic Amplitude: 31.625 mV
Lead Channel Sensing Intrinsic Amplitude: 6 mV
Lead Channel Sensing Intrinsic Amplitude: 6 mV
Lead Channel Setting Pacing Amplitude: 1.5 V
Lead Channel Setting Pacing Amplitude: 2.5 V
Lead Channel Setting Pacing Pulse Width: 0.4 ms
Lead Channel Setting Sensing Sensitivity: 2.8 mV
Zone Setting Status: 755011
Zone Setting Status: 755011

## 2024-08-05 ENCOUNTER — Ambulatory Visit: Payer: Self-pay | Admitting: Internal Medicine

## 2024-08-10 DIAGNOSIS — E1122 Type 2 diabetes mellitus with diabetic chronic kidney disease: Secondary | ICD-10-CM | POA: Diagnosis not present

## 2024-08-10 DIAGNOSIS — N1832 Chronic kidney disease, stage 3b: Secondary | ICD-10-CM | POA: Diagnosis not present

## 2024-08-10 DIAGNOSIS — R809 Proteinuria, unspecified: Secondary | ICD-10-CM | POA: Diagnosis not present

## 2024-08-10 DIAGNOSIS — I129 Hypertensive chronic kidney disease with stage 1 through stage 4 chronic kidney disease, or unspecified chronic kidney disease: Secondary | ICD-10-CM | POA: Diagnosis not present

## 2024-09-07 ENCOUNTER — Other Ambulatory Visit: Payer: Self-pay

## 2024-09-07 DIAGNOSIS — I10 Essential (primary) hypertension: Secondary | ICD-10-CM

## 2024-09-07 NOTE — Progress Notes (Signed)
 This patient is appearing on a report for being at risk of failing the adherence measure for hypertension (ACEi/ARB) medications this calendar year.   Medication: lisinopril -hydrochlorothiazide  10-12.5 mg daily Last fill date: 05/02/24 for 90 day supply  Attempted to outreach patient's son to discuss medication adherence. Per fill hx and pharmacy records, patient is due for a refill of lisinopril -hydrochlorothiazide  for HTN and there are no refills remaining on the prescription. However, his last BMP is from his hospitalization in Feb 2025 at which time he had an AKI (Scr > 2 mg/dL, baseline ~8.3 mg/dL) likely due to dehydration and he has not attended his follow-up appts in clinic to reassess his electrolytes or kidney function. LVM reminding patient/son of upcoming appt at [facility] scheduled 9/222 at 9:45AM. Will notify PCP of lapse in fills.  If kidney function continues to be reduced with eGFR < 45 mL/min, should reduce TDD of metformin  to 1000 mg. He is currently prescribed a TDD of 2000 mg with Janumet  (sitagliptin-metformin ) 50-1000 mg BID, though this has not been filled since 01/07/24 for a 90ds.  Lorain Baseman, PharmD [facility] [phone]

## 2024-09-08 MED ORDER — LISINOPRIL-HYDROCHLOROTHIAZIDE 10-12.5 MG PO TABS
1.0000 | ORAL_TABLET | Freq: Every day | ORAL | 0 refills | Status: DC
Start: 2024-09-08 — End: 2024-09-11

## 2024-09-08 NOTE — Addendum Note (Signed)
 Addended by: ROSAN DAYTON BROCKS on: 09/08/2024 03:49 PM   Modules accepted: Orders

## 2024-09-08 NOTE — Progress Notes (Signed)
 Will send in a 30 day supply and then can reassess at the visit, thanks!

## 2024-09-10 ENCOUNTER — Other Ambulatory Visit: Payer: Self-pay | Admitting: Student

## 2024-09-10 DIAGNOSIS — I10 Essential (primary) hypertension: Secondary | ICD-10-CM

## 2024-09-11 NOTE — Telephone Encounter (Signed)
 Pharmacy requesting additional information  Medication sent to pharmacy.

## 2024-09-18 ENCOUNTER — Encounter: Payer: Self-pay | Admitting: Internal Medicine

## 2024-09-22 NOTE — Progress Notes (Signed)
 Remote PPM Transmission

## 2024-11-03 ENCOUNTER — Ambulatory Visit: Payer: PPO

## 2024-11-03 DIAGNOSIS — I441 Atrioventricular block, second degree: Secondary | ICD-10-CM | POA: Diagnosis not present

## 2024-11-05 LAB — CUP PACEART REMOTE DEVICE CHECK
Battery Remaining Longevity: 71 mo
Battery Voltage: 2.97 V
Brady Statistic AP VP Percent: 10.65 %
Brady Statistic AP VS Percent: 0.08 %
Brady Statistic AS VP Percent: 88.61 %
Brady Statistic AS VS Percent: 0.66 %
Brady Statistic RA Percent Paced: 10.84 %
Brady Statistic RV Percent Paced: 99.26 %
Date Time Interrogation Session: 20251107054742
Implantable Lead Connection Status: 753985
Implantable Lead Connection Status: 753985
Implantable Lead Implant Date: 20210210
Implantable Lead Implant Date: 20210210
Implantable Lead Location: 753859
Implantable Lead Location: 753860
Implantable Lead Model: 3830
Implantable Lead Model: 5076
Implantable Pulse Generator Implant Date: 20210210
Lead Channel Impedance Value: 285 Ohm
Lead Channel Impedance Value: 361 Ohm
Lead Channel Impedance Value: 361 Ohm
Lead Channel Impedance Value: 570 Ohm
Lead Channel Pacing Threshold Amplitude: 0.5 V
Lead Channel Pacing Threshold Amplitude: 0.625 V
Lead Channel Pacing Threshold Pulse Width: 0.4 ms
Lead Channel Pacing Threshold Pulse Width: 0.4 ms
Lead Channel Sensing Intrinsic Amplitude: 31.625 mV
Lead Channel Sensing Intrinsic Amplitude: 31.625 mV
Lead Channel Sensing Intrinsic Amplitude: 5.375 mV
Lead Channel Sensing Intrinsic Amplitude: 5.375 mV
Lead Channel Setting Pacing Amplitude: 1.5 V
Lead Channel Setting Pacing Amplitude: 2.5 V
Lead Channel Setting Pacing Pulse Width: 0.4 ms
Lead Channel Setting Sensing Sensitivity: 2.8 mV
Zone Setting Status: 755011
Zone Setting Status: 755011

## 2024-11-06 ENCOUNTER — Ambulatory Visit: Payer: Self-pay | Admitting: Internal Medicine

## 2024-11-07 NOTE — Progress Notes (Signed)
 Remote PPM Transmission

## 2024-11-21 DIAGNOSIS — R809 Proteinuria, unspecified: Secondary | ICD-10-CM | POA: Diagnosis not present

## 2024-11-21 DIAGNOSIS — N1832 Chronic kidney disease, stage 3b: Secondary | ICD-10-CM | POA: Diagnosis not present

## 2024-11-21 DIAGNOSIS — I129 Hypertensive chronic kidney disease with stage 1 through stage 4 chronic kidney disease, or unspecified chronic kidney disease: Secondary | ICD-10-CM | POA: Diagnosis not present

## 2024-11-21 DIAGNOSIS — E1122 Type 2 diabetes mellitus with diabetic chronic kidney disease: Secondary | ICD-10-CM | POA: Diagnosis not present

## 2024-11-21 LAB — LAB REPORT - SCANNED
A1c: 10.4
EGFR: 37

## 2025-02-02 ENCOUNTER — Ambulatory Visit: Payer: PPO

## 2025-05-04 ENCOUNTER — Ambulatory Visit

## 2025-08-03 ENCOUNTER — Ambulatory Visit

## 2025-11-02 ENCOUNTER — Ambulatory Visit

## 2026-02-01 ENCOUNTER — Ambulatory Visit

## 2026-05-03 ENCOUNTER — Ambulatory Visit

## 2026-08-02 ENCOUNTER — Ambulatory Visit

## 2026-11-01 ENCOUNTER — Ambulatory Visit

## 2027-01-31 ENCOUNTER — Ambulatory Visit
